# Patient Record
Sex: Female | Born: 1960 | Race: Black or African American | Hispanic: No | Marital: Single | Smoking: Never smoker
Health system: Southern US, Community
[De-identification: ages and names within clinical notes are randomized; demographics above are authoritative.]

## PROBLEM LIST (undated history)

## (undated) DIAGNOSIS — L0100 Impetigo, unspecified: Secondary | ICD-10-CM

## (undated) DIAGNOSIS — D472 Monoclonal gammopathy: Secondary | ICD-10-CM

## (undated) DIAGNOSIS — D649 Anemia, unspecified: Secondary | ICD-10-CM

## (undated) DIAGNOSIS — D329 Benign neoplasm of meninges, unspecified: Secondary | ICD-10-CM

## (undated) DIAGNOSIS — Z86718 Personal history of other venous thrombosis and embolism: Secondary | ICD-10-CM

## (undated) DIAGNOSIS — Z862 Personal history of diseases of the blood and blood-forming organs and certain disorders involving the immune mechanism: Secondary | ICD-10-CM

## (undated) DIAGNOSIS — F70 Mild intellectual disabilities: Secondary | ICD-10-CM

## (undated) DIAGNOSIS — R160 Hepatomegaly, not elsewhere classified: Secondary | ICD-10-CM

## (undated) DIAGNOSIS — Z86711 Personal history of pulmonary embolism: Secondary | ICD-10-CM

## (undated) DIAGNOSIS — L0390 Cellulitis, unspecified: Secondary | ICD-10-CM

## (undated) DIAGNOSIS — R569 Unspecified convulsions: Secondary | ICD-10-CM

## (undated) DIAGNOSIS — E079 Disorder of thyroid, unspecified: Secondary | ICD-10-CM

## (undated) HISTORY — DX: Hepatomegaly, not elsewhere classified: R16.0

## (undated) HISTORY — DX: Monoclonal gammopathy: D47.2

## (undated) HISTORY — DX: Personal history of other venous thrombosis and embolism: Z86.718

## (undated) HISTORY — DX: Personal history of pulmonary embolism: Z86.711

## (undated) HISTORY — PX: NO PAST SURGERIES: SHX2092

## (undated) HISTORY — DX: Impetigo, unspecified: L01.00

## (undated) HISTORY — DX: Disorder of thyroid, unspecified: E07.9

## (undated) HISTORY — DX: Benign neoplasm of meninges, unspecified: D32.9

## (undated) HISTORY — DX: Cellulitis, unspecified: L03.90

## (undated) HISTORY — DX: Unspecified convulsions: R56.9

## (undated) HISTORY — DX: Anemia, unspecified: D64.9

## (undated) HISTORY — DX: Personal history of diseases of the blood and blood-forming organs and certain disorders involving the immune mechanism: Z86.2

## (undated) HISTORY — DX: Mild intellectual disabilities: F70

---

## 2003-10-29 ENCOUNTER — Ambulatory Visit: Payer: Self-pay | Admitting: Internal Medicine

## 2003-11-12 ENCOUNTER — Ambulatory Visit: Payer: Self-pay | Admitting: Internal Medicine

## 2005-01-17 ENCOUNTER — Ambulatory Visit: Payer: Self-pay | Admitting: Internal Medicine

## 2005-01-25 ENCOUNTER — Ambulatory Visit: Payer: Self-pay | Admitting: Internal Medicine

## 2005-02-15 ENCOUNTER — Ambulatory Visit: Payer: Self-pay | Admitting: Internal Medicine

## 2006-09-07 ENCOUNTER — Emergency Department: Payer: Self-pay | Admitting: Emergency Medicine

## 2008-11-02 ENCOUNTER — Ambulatory Visit: Payer: Self-pay | Admitting: Internal Medicine

## 2008-11-03 ENCOUNTER — Inpatient Hospital Stay: Payer: Self-pay | Admitting: Internal Medicine

## 2008-11-17 ENCOUNTER — Inpatient Hospital Stay: Payer: Self-pay | Admitting: Internal Medicine

## 2008-11-29 ENCOUNTER — Inpatient Hospital Stay: Payer: Self-pay | Admitting: Internal Medicine

## 2009-07-17 ENCOUNTER — Ambulatory Visit: Payer: Self-pay | Admitting: Geriatric Medicine

## 2011-01-24 ENCOUNTER — Ambulatory Visit: Payer: Self-pay | Admitting: Internal Medicine

## 2011-11-23 ENCOUNTER — Emergency Department: Payer: Self-pay | Admitting: Emergency Medicine

## 2011-11-23 LAB — URINALYSIS, COMPLETE
Blood: NEGATIVE
Ketone: NEGATIVE
Ph: 6 (ref 4.5–8.0)
Protein: NEGATIVE
Specific Gravity: 1.008 (ref 1.003–1.030)

## 2011-11-23 LAB — HEPATIC FUNCTION PANEL A (ARMC)
Albumin: 3.5 g/dL (ref 3.4–5.0)
Alkaline Phosphatase: 147 U/L — ABNORMAL HIGH (ref 50–136)
Bilirubin, Direct: <0.1 mg/dL (ref 0.00–0.20)
SGOT(AST): 25 U/L (ref 15–37)
SGPT (ALT): 20 U/L (ref 12–78)

## 2011-11-23 LAB — PHENYTOIN LEVEL, TOTAL: Dilantin: 14.2 ug/mL (ref 10.0–20.0)

## 2011-11-23 LAB — BASIC METABOLIC PANEL
Anion Gap: 3 — ABNORMAL LOW (ref 7–16)
BUN: 12 mg/dL (ref 7–18)
Calcium, Total: 8.9 mg/dL (ref 8.5–10.1)
Chloride: 107 mmol/L (ref 98–107)
EGFR (Non-African Amer.): >60
Osmolality: 276 (ref 275–301)
Potassium: 4.3 mmol/L (ref 3.5–5.1)

## 2011-11-23 LAB — CBC
HGB: 11 g/dL — ABNORMAL LOW (ref 12.0–16.0)
MCV: 69 fL — ABNORMAL LOW (ref 80–100)
Platelet: 291 10*3/uL (ref 150–440)
RBC: 5 10*6/uL (ref 3.80–5.20)
WBC: 27.9 10*3/uL — ABNORMAL HIGH (ref 3.6–11.0)

## 2011-11-23 LAB — DIFFERENTIAL
Comment - H1-Com1: NORMAL
Eosinophil: 1 %
Segmented Neutrophils: 24 %
Variant Lymphocyte - H1-Rlymph: 7 %

## 2011-11-23 LAB — PROTIME-INR: INR: 1.9

## 2011-11-23 LAB — WET PREP, GENITAL

## 2011-11-23 LAB — APTT: Activated PTT: 24.1 secs (ref 23.6–35.9)

## 2011-11-25 LAB — URINE CULTURE

## 2011-11-29 LAB — CULTURE, BLOOD (SINGLE)

## 2012-02-29 ENCOUNTER — Ambulatory Visit: Payer: Self-pay | Admitting: Internal Medicine

## 2012-03-27 ENCOUNTER — Ambulatory Visit: Payer: Self-pay | Admitting: Internal Medicine

## 2012-03-28 LAB — CBC CANCER CENTER
Basophil %: 0.4 %
Eosinophil %: 0.9 %
HCT: 36.1 % (ref 35.0–47.0)
HGB: 11.1 g/dL — ABNORMAL LOW (ref 12.0–16.0)
Lymphocyte %: 53 %
MCV: 68 fL — ABNORMAL LOW (ref 80–100)
Monocyte #: 0.9 x10 3/mm (ref 0.2–0.9)
Monocyte %: 4.4 %
Neutrophil #: 8.9 x10 3/mm — ABNORMAL HIGH (ref 1.4–6.5)
Neutrophil %: 41.3 %
Platelet: 240 x10 3/mm (ref 150–440)
RBC: 5.28 10*6/uL — ABNORMAL HIGH (ref 3.80–5.20)
WBC: 21.5 x10 3/mm — ABNORMAL HIGH (ref 3.6–11.0)

## 2012-03-28 LAB — CREATININE, SERUM
EGFR (African American): >60
EGFR (Non-African Amer.): >60

## 2012-03-29 LAB — PROT IMMUNOELECTROPHORES(ARMC)

## 2012-04-02 ENCOUNTER — Ambulatory Visit: Payer: Self-pay | Admitting: Internal Medicine

## 2012-05-02 ENCOUNTER — Ambulatory Visit: Payer: Self-pay | Admitting: Internal Medicine

## 2012-06-02 ENCOUNTER — Ambulatory Visit: Payer: Self-pay | Admitting: Internal Medicine

## 2012-06-05 ENCOUNTER — Ambulatory Visit: Payer: Self-pay | Admitting: Internal Medicine

## 2012-06-05 LAB — CBC CANCER CENTER
Eosinophil #: 0.2 x10 3/mm (ref 0.0–0.7)
HGB: 12.3 g/dL (ref 12.0–16.0)
Lymphocyte %: 44.7 %
MCH: 21.6 pg — ABNORMAL LOW (ref 26.0–34.0)
MCV: 68 fL — ABNORMAL LOW (ref 80–100)
Monocyte #: 1 x10 3/mm — ABNORMAL HIGH (ref 0.2–0.9)
Neutrophil #: 9.6 x10 3/mm — ABNORMAL HIGH (ref 1.4–6.5)
Neutrophil %: 48.7 %
Platelet: 306 x10 3/mm (ref 150–440)
RBC: 5.69 10*6/uL — ABNORMAL HIGH (ref 3.80–5.20)
RDW: 15.8 % — ABNORMAL HIGH (ref 11.5–14.5)
WBC: 19.6 x10 3/mm — ABNORMAL HIGH (ref 3.6–11.0)

## 2012-06-24 ENCOUNTER — Ambulatory Visit: Payer: Self-pay | Admitting: Surgery

## 2012-06-24 LAB — PROTIME-INR
INR: 1.2
Prothrombin Time: 15.1 secs — ABNORMAL HIGH (ref 11.5–14.7)

## 2012-07-02 ENCOUNTER — Ambulatory Visit: Payer: Self-pay | Admitting: Internal Medicine

## 2012-07-17 LAB — CBC CANCER CENTER
Basophil #: 0.2 x10 3/mm — ABNORMAL HIGH (ref 0.0–0.1)
Basophil %: 1 %
Eosinophil %: 0.8 %
HCT: 33.5 % — ABNORMAL LOW (ref 35.0–47.0)
HGB: 10.6 g/dL — ABNORMAL LOW (ref 12.0–16.0)
Lymphocyte #: 8.9 x10 3/mm — ABNORMAL HIGH (ref 1.0–3.6)
Lymphocyte %: 49.5 %
MCH: 21.2 pg — ABNORMAL LOW (ref 26.0–34.0)
MCHC: 31.5 g/dL — ABNORMAL LOW (ref 32.0–36.0)
Monocyte #: 0.9 x10 3/mm (ref 0.2–0.9)
Neutrophil #: 7.9 x10 3/mm — ABNORMAL HIGH (ref 1.4–6.5)
Neutrophil %: 43.7 %
Platelet: 284 x10 3/mm (ref 150–440)
RBC: 4.98 10*6/uL (ref 3.80–5.20)
RDW: 15.5 % — ABNORMAL HIGH (ref 11.5–14.5)
WBC: 18 x10 3/mm — ABNORMAL HIGH (ref 3.6–11.0)

## 2012-07-17 LAB — CREATININE, SERUM
Creatinine: 0.8 mg/dL (ref 0.60–1.30)
EGFR (African American): >60
EGFR (Non-African Amer.): >60

## 2012-07-18 LAB — CEA: CEA: 1.4 ng/mL (ref 0.0–4.7)

## 2012-07-18 LAB — PROT IMMUNOELECTROPHORES(ARMC)

## 2012-08-02 ENCOUNTER — Ambulatory Visit: Payer: Self-pay | Admitting: Internal Medicine

## 2012-08-22 LAB — CBC CANCER CENTER
Basophil %: 0.6 %
Eosinophil #: 0.1 x10 3/mm (ref 0.0–0.7)
Lymphocyte #: 10.5 x10 3/mm — ABNORMAL HIGH (ref 1.0–3.6)
Lymphocyte %: 46.3 %
MCH: 21.1 pg — ABNORMAL LOW (ref 26.0–34.0)
MCHC: 31 g/dL — ABNORMAL LOW (ref 32.0–36.0)
MCV: 68 fL — ABNORMAL LOW (ref 80–100)
Monocyte %: 4.2 %
Neutrophil #: 10.9 x10 3/mm — ABNORMAL HIGH (ref 1.4–6.5)
Neutrophil %: 48.3 %
RBC: 5.49 10*6/uL — ABNORMAL HIGH (ref 3.80–5.20)
WBC: 22.7 x10 3/mm — ABNORMAL HIGH (ref 3.6–11.0)

## 2012-08-22 LAB — IRON AND TIBC
Iron Bind.Cap.(Total): 221 ug/dL — ABNORMAL LOW (ref 250–450)
Iron: 94 ug/dL (ref 50–170)
Unbound Iron-Bind.Cap.: 127 ug/dL

## 2012-08-22 LAB — FERRITIN: Ferritin (ARMC): 65 ng/mL (ref 8–388)

## 2012-08-23 LAB — PROT IMMUNOELECTROPHORES(ARMC)

## 2012-09-02 ENCOUNTER — Ambulatory Visit: Payer: Self-pay | Admitting: Internal Medicine

## 2013-07-03 ENCOUNTER — Ambulatory Visit: Payer: Self-pay | Admitting: Family Medicine

## 2014-06-19 ENCOUNTER — Other Ambulatory Visit: Payer: Self-pay | Admitting: Family Medicine

## 2014-06-19 DIAGNOSIS — Z1239 Encounter for other screening for malignant neoplasm of breast: Secondary | ICD-10-CM

## 2014-07-07 ENCOUNTER — Ambulatory Visit: Payer: Medicare Other

## 2014-07-07 ENCOUNTER — Ambulatory Visit
Admission: RE | Admit: 2014-07-07 | Discharge: 2014-07-07 | Disposition: A | Discharge status: Home / Self Care | Payer: Medicare Other | Source: Ambulatory Visit | Attending: Family Medicine | Admitting: Family Medicine

## 2014-07-07 ENCOUNTER — Other Ambulatory Visit: Payer: Self-pay | Admitting: Family Medicine

## 2014-07-07 ENCOUNTER — Inpatient Hospital Stay: Payer: Medicare Other | Attending: Hematology and Oncology | Admitting: Hematology and Oncology

## 2014-07-07 ENCOUNTER — Encounter: Payer: Self-pay | Admitting: Hematology and Oncology

## 2014-07-07 VITALS — BP 145/78 | HR 76 | Temp 95.5°F | Resp 18 | Wt 198.2 lb

## 2014-07-07 DIAGNOSIS — Z79899 Other long term (current) drug therapy: Secondary | ICD-10-CM

## 2014-07-07 DIAGNOSIS — Z1239 Encounter for other screening for malignant neoplasm of breast: Secondary | ICD-10-CM

## 2014-07-07 DIAGNOSIS — D329 Benign neoplasm of meninges, unspecified: Secondary | ICD-10-CM | POA: Diagnosis not present

## 2014-07-07 DIAGNOSIS — Z86718 Personal history of other venous thrombosis and embolism: Secondary | ICD-10-CM

## 2014-07-07 DIAGNOSIS — N390 Urinary tract infection, site not specified: Secondary | ICD-10-CM | POA: Diagnosis present

## 2014-07-07 DIAGNOSIS — M6282 Rhabdomyolysis: Secondary | ICD-10-CM | POA: Diagnosis present

## 2014-07-07 DIAGNOSIS — I82431 Acute embolism and thrombosis of right popliteal vein: Principal | ICD-10-CM | POA: Diagnosis present

## 2014-07-07 DIAGNOSIS — C8510 Unspecified B-cell lymphoma, unspecified site: Secondary | ICD-10-CM | POA: Insufficient documentation

## 2014-07-07 DIAGNOSIS — Z86711 Personal history of pulmonary embolism: Secondary | ICD-10-CM | POA: Insufficient documentation

## 2014-07-07 DIAGNOSIS — D472 Monoclonal gammopathy: Secondary | ICD-10-CM | POA: Diagnosis not present

## 2014-07-07 DIAGNOSIS — Z1231 Encounter for screening mammogram for malignant neoplasm of breast: Secondary | ICD-10-CM

## 2014-07-07 DIAGNOSIS — Z7901 Long term (current) use of anticoagulants: Secondary | ICD-10-CM | POA: Insufficient documentation

## 2014-07-07 DIAGNOSIS — Z0000 Encounter for general adult medical examination without abnormal findings: Secondary | ICD-10-CM

## 2014-07-07 DIAGNOSIS — G40909 Epilepsy, unspecified, not intractable, without status epilepticus: Secondary | ICD-10-CM | POA: Diagnosis present

## 2014-07-07 DIAGNOSIS — Z8249 Family history of ischemic heart disease and other diseases of the circulatory system: Secondary | ICD-10-CM

## 2014-07-07 DIAGNOSIS — D649 Anemia, unspecified: Secondary | ICD-10-CM | POA: Diagnosis present

## 2014-07-07 DIAGNOSIS — F70 Mild intellectual disabilities: Secondary | ICD-10-CM | POA: Diagnosis present

## 2014-07-07 DIAGNOSIS — L03115 Cellulitis of right lower limb: Secondary | ICD-10-CM | POA: Diagnosis present

## 2014-07-07 DIAGNOSIS — I824Z1 Acute embolism and thrombosis of unspecified deep veins of right distal lower extremity: Secondary | ICD-10-CM | POA: Diagnosis present

## 2014-07-07 DIAGNOSIS — D509 Iron deficiency anemia, unspecified: Secondary | ICD-10-CM | POA: Diagnosis not present

## 2014-07-07 LAB — CBC WITH DIFFERENTIAL/PLATELET
Basophils Absolute: 0.1 10*3/uL (ref 0–0.1)
Basophils Relative: 0 %
Eosinophils Absolute: 0.2 10*3/uL (ref 0–0.7)
Eosinophils Relative: 1 %
HCT: 33.4 % — ABNORMAL LOW (ref 35.0–47.0)
Hemoglobin: 10 g/dL — ABNORMAL LOW (ref 12.0–16.0)
Lymphocytes Relative: 64 %
Lymphs Abs: 14.6 10*3/uL — ABNORMAL HIGH (ref 1.0–3.6)
MCH: 20.7 pg — ABNORMAL LOW (ref 26.0–34.0)
MCHC: 29.8 g/dL — ABNORMAL LOW (ref 32.0–36.0)
MCV: 69.5 fL — ABNORMAL LOW (ref 80.0–100.0)
Monocytes Absolute: 0.9 10*3/uL (ref 0.2–0.9)
Monocytes Relative: 4 %
Neutro Abs: 7 10*3/uL — ABNORMAL HIGH (ref 1.4–6.5)
Neutrophils Relative %: 31 %
Platelets: 275 10*3/uL (ref 150–440)
RBC: 4.81 MIL/uL (ref 3.80–5.20)
RDW: 16.4 % — ABNORMAL HIGH (ref 11.5–14.5)
WBC: 22.8 10*3/uL — ABNORMAL HIGH (ref 3.6–11.0)

## 2014-07-07 LAB — COMPREHENSIVE METABOLIC PANEL
ALT: 13 U/L — ABNORMAL LOW (ref 14–54)
AST: 17 U/L (ref 15–41)
Albumin: 3.5 g/dL (ref 3.5–5.0)
Alkaline Phosphatase: 121 U/L (ref 38–126)
Anion gap: 6 (ref 5–15)
BUN: 11 mg/dL (ref 6–20)
CO2: 27 mmol/L (ref 22–32)
Calcium: 8.3 mg/dL — ABNORMAL LOW (ref 8.9–10.3)
Chloride: 102 mmol/L (ref 101–111)
Creatinine, Ser: 0.69 mg/dL (ref 0.44–1.00)
GFR calc Af Amer: >60 mL/min (ref >60)
GFR calc non Af Amer: >60 mL/min (ref >60)
Glucose, Bld: 83 mg/dL (ref 65–99)
Potassium: 3.9 mmol/L (ref 3.5–5.1)
Sodium: 135 mmol/L (ref 135–145)
Total Bilirubin: 0.2 mg/dL — ABNORMAL LOW (ref 0.3–1.2)
Total Protein: 8.3 g/dL — ABNORMAL HIGH (ref 6.5–8.1)

## 2014-07-07 LAB — FERRITIN: Ferritin: 34 ng/mL (ref 11–307)

## 2014-07-07 LAB — SEDIMENTATION RATE: Sed Rate: 20 mm/hr (ref 0–30)

## 2014-07-07 LAB — RETICULOCYTES
RBC.: 4.81 MIL/uL (ref 3.80–5.20)
Retic Count, Absolute: 57.7 10*3/uL (ref 19.0–183.0)
Retic Ct Pct: 1.2 % (ref 0.4–3.1)

## 2014-07-07 LAB — IRON AND TIBC
Iron: 68 ug/dL (ref 28–170)
Saturation Ratios: 32 % — ABNORMAL HIGH (ref 10.4–31.8)
TIBC: 212 ug/dL — ABNORMAL LOW (ref 250–450)
UIBC: 144 ug/dL

## 2014-07-07 LAB — URIC ACID: Uric Acid, Serum: 5.3 mg/dL (ref 2.3–6.6)

## 2014-07-07 LAB — LACTATE DEHYDROGENASE: LDH: 133 U/L (ref 98–192)

## 2014-07-07 NOTE — Progress Notes (Signed)
[facility] day:  07/07/2014  Chief Complaint: Laurie Rice is a 54 y.o. female with a low grade B cell lymphoma,  microcytic anemia, and meningioma who is seen for reassessment.  HPI: The patient has a history of lymphocytosis and neutrophilia dating back to at least 09/2006. Flow cytometry on 11/2008 revealed a low-grade B-cell lymphoma (CD20 positive, CD5 negative). BCR-ABL testing was negative.  In association, she has had an IgM gammopathy. Notes indicate a level of 300 mg/dL in 11/2008 and then later 1.3 g/dL.  She has had chronic microcytic anemia also noted in 2008. Hemoglobin has ranged between 10 and 11. Notes from Dr. Inez Pilgrim reveal negative stool guaiacs. B12 was normal. Iron saturation was 19%. Hemoglobin electrophoresis revealed a normal hemoglobin F and A2 thus ruling out beta thalassemia. Colonoscopy in 05/2011 was normal.  The patient has had imaging studies suggesting a possible lung mass. PET scan in 11/2008 was negative. Abdominal CT scan in 2013 revealed atelectasis of the lung bases. There was also question of a possible liver mass on contrast CT in 11/2011. MRI in 04/2012 was negative.  Patient has had abnormal thyroid noted on CT scan with abnormal PET uptake in 2010. She was seen by Dr. Jamal Collin. Ultrasound guided biopsy on 06/24/2012 was negative.  She has a seizure disorder and with notation of  a meningioma. Meningioma was 5 mm on 11/2008. She has not had follow-up MRIs.  She has a history of bilateral DVT and pulmonary embolism in 2010. She is on Coumadin maintenance which is monitored by her primary care physician.   Past Medical History  Diagnosis Date  . Seizures   . Mild mental retardation   . Meningioma   . 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 Personal history of pulmonary embolism   . History of lymphocytosis   . Monoclonal gammopathy     low level  . Anemia     chronic, mcv  chronically low  . Cellulitis     History of  . Impetigo     History of  . Liver masses     History of, Benign  . Thyroid mass     history of    Past Surgical History  Procedure Laterality Date  . No past surgeries      Family History  Problem Relation Age of Onset  . Hypertension    . Arthritis      Social History:  reports that she has never smoked. She does not have any smokeless tobacco history on file. She reports that she does not drink alcohol or use illicit drugs.  The patient is accompanied by Maudie Mercury from family care at [facility] family [facility].  She spends her days watching TV.  She can dress on her own.  She needs help with showers.  Allergies: No Known Allergies  Current Medications: No current facility-administered medications for this visit.   Current Outpatient Prescriptions  Medication Sig Dispense Refill  . amoxicillin-clavulanate (AUGMENTIN) 875-125 MG per tablet Take 1 tablet by mouth 2 (two) times daily. 10 tablet 0  . enoxaparin (LOVENOX) 100 MG/ML injection Inject 0.9 mLs (90 mg total) into the skin every 12 (twelve) hours. 10 mL 0  . levofloxacin (LEVAQUIN) 500 MG tablet Take 1 tablet (500 mg total) by mouth daily. 3 tablet 0   Facility-Administered Medications Ordered in Other Visits  Medication Dose Route Frequency Provider Last Rate Last Dose  .  acetaminophen (TYLENOL) tablet 650 mg  650 mg Oral Q6H PRN Lance Coon, MD       Or  . acetaminophen (TYLENOL) suppository 650 mg  650 mg Rectal Q6H PRN Lance Coon, MD      . Ampicillin-Sulbactam (UNASYN) 3 g in sodium chloride 0.9 % 100 mL IVPB  3 g Intravenous Q6H Dustin Flock, MD   3 g at 07/13/14 1206  . enoxaparin (LOVENOX) injection 90 mg  1 mg/kg Subcutaneous BID Vaughan Basta, MD   90 mg at 07/13/14 1207  . HYDROcodone-acetaminophen (NORCO/VICODIN) 5-325 MG per tablet 1-2 tablet  1-2 tablet Oral Q4H PRN Lance Coon, MD      . levofloxacin [facility]) tablet 500 mg  500 mg Oral Daily  Vaughan Basta, MD   500 mg at 07/13/14 0854  . PHENobarbital (LUMINAL) tablet 64.8 mg  64.8 mg Oral QHS Vaughan Basta, MD      . phenytoin (DILANTIN) chewable tablet 50 mg  50 mg Oral QHS Vaughan Basta, MD   50 mg at 07/12/14 2143  . phenytoin (DILANTIN) ER capsule 200 mg  200 mg Oral QHS Lance Coon, MD   200 mg at 07/12/14 2143  . warfarin (COUMADIN) tablet 7.5 mg  7.5 mg Oral q1800 Vaughan Basta, MD   7.5 mg at 07/12/14 1818  . Warfarin - Pharmacist Dosing Inpatient   Does not apply [id number] Vaughan Basta, MD        Review of Systems:  GENERAL:  Feels fine.  Active.  No fevers or sweats.  Weight loss minimal. PERFORMANCE STATUS (ECOG):  3 HEENT:  No visual changes, runny nose, sore throat, mouth sores or tenderness. Lungs: No shortness of breath or cough.  No hemoptysis. Cardiac:  No chest pain, palpitations, orthopnea, or PND. GI:  No nausea, vomiting, diarrhea, constipation, melena or hematochezia. GU:  No urgency, frequency, dysuria, or hematuria. Musculoskeletal:  No back pain.  No joint pain.  No muscle tenderness. Extremities:  No pain or swelling. Skin:  No rashes or skin changes. Neuro:  No seizure in a long time.  No headache, numbness or weakness, balance or coordination issues. Endocrine:  No diabetes, thyroid issues, hot flashes or night sweats. Psych:  No mood changes, depression or anxiety. Pain:  No focal pain. Review of systems:  All other systems reviewed and found to be negative.   Physical Exam: Blood pressure 145/78, pulse 76, temperature 95.5 F (35.3 C), temperature source Tympanic, resp. rate 18, weight 198 lb 3.1 oz (89.9 kg). GENERAL:  Well developed, well nourished, sitting comfortably in the exam room in no acute distress.  Rolling walker at her side.   MENTAL STATUS:  Alert and oriented to person, place and time. HEAD:  Flat facial expression.  Normocephalic, atraumatic, face symmetric, no Cushingoid features. EYES:   Pupils equal round and reactive to light and accomodation.  No conjunctivitis or scleral icterus. ENT:  Oropharynx clear without lesion.  Tongue normal. Mucous membranes moist.  RESPIRATORY:  Clear to auscultation without rales, wheezes or rhonchi. CARDIOVASCULAR:  Regular rate and rhythm without murmur, rub or gallop. ABDOMEN:  Soft, non-tender, with active bowel sounds, and no hepatosplenomegaly.  No masses. SKIN:  Bruising on right leg 1 week ago without trauma.  No rashes, ulcers or lesions. EXTREMITIES: No edema, no skin discoloration or tenderness.  No palpable cords. LYMPH NODES: No palpable cervical, supraclavicular, axillary or inguinal adenopathy  PSYCH:  Appropriate.  Office Visit on 07/07/2014  Component Date Value Ref Range Status  .  Uric Acid, Serum 07/07/2014 5.3  2.3 - 6.6 mg/dL Final  . WBC 07/07/2014 22.8* 3.6 - 11.0 K/uL Final  . RBC 07/07/2014 4.81  3.80 - 5.20 MIL/uL Final  . Hemoglobin 07/07/2014 10.0* 12.0 - 16.0 g/dL Final  . HCT 07/07/2014 33.4* 35.0 - 47.0 % Final  . MCV 07/07/2014 69.5* 80.0 - 100.0 fL Final  . MCH 07/07/2014 20.7* 26.0 - 34.0 pg Final  . MCHC 07/07/2014 29.8* 32.0 - 36.0 g/dL Final  . RDW 07/07/2014 16.4* 11.5 - 14.5 % Final  . Platelets 07/07/2014 275  150 - 440 K/uL Final  . Neutrophils Relative % 07/07/2014 31%   Final  . Neutro Abs 07/07/2014 7.0* 1.4 - 6.5 K/uL Final  . Lymphocytes Relative 07/07/2014 64%   Final  . Lymphs Abs 07/07/2014 14.6* 1.0 - 3.6 K/uL Final  . Monocytes Relative 07/07/2014 4%   Final  . Monocytes Absolute 07/07/2014 0.9  0.2 - 0.9 K/uL Final  . Eosinophils Relative 07/07/2014 1%   Final  . Eosinophils Absolute 07/07/2014 0.2  0 - 0.7 K/uL Final  . Basophils Relative 07/07/2014 0%   Final  . Basophils Absolute 07/07/2014 0.1  0 - 0.1 K/uL Final  . Ferritin 07/07/2014 34  11 - 307 ng/mL Final  . Sed Rate 07/07/2014 20  0 - 30 mm/hr Final  . Iron 07/07/2014 68  28 - 170 ug/dL Final  . TIBC 07/07/2014 212* 250 -  450 ug/dL Final  . Saturation Ratios 07/07/2014 32* 10.4 - 31.8 % Final  . UIBC 07/07/2014 144   Final  . Sodium 07/07/2014 135  135 - 145 mmol/L Final  . Potassium 07/07/2014 3.9  3.5 - 5.1 mmol/L Final  . Chloride 07/07/2014 102  101 - 111 mmol/L Final  . CO2 07/07/2014 27  22 - 32 mmol/L Final  . Glucose, Bld 07/07/2014 83  65 - 99 mg/dL Final  . BUN 07/07/2014 11  6 - 20 mg/dL Final  . Creatinine, Ser 07/07/2014 0.69  0.44 - 1.00 mg/dL Final  . Calcium 07/07/2014 8.3* 8.9 - 10.3 mg/dL Final  . Total Protein 07/07/2014 8.3* 6.5 - 8.1 g/dL Final  . Albumin 07/07/2014 3.5  3.5 - 5.0 g/dL Final  . AST 07/07/2014 17  15 - 41 U/L Final  . ALT 07/07/2014 13* 14 - 54 U/L Final  . Alkaline Phosphatase 07/07/2014 121  38 - 126 U/L Final  . Total Bilirubin 07/07/2014 0.2* 0.3 - 1.2 mg/dL Final  . GFR calc non Af Amer 07/07/2014 >60  >60 mL/min Final  . GFR calc Af Amer 07/07/2014 >60  >60 mL/min Final   Comment: (NOTE) The eGFR has been calculated using the CKD EPI equation. This calculation has not been validated in all clinical situations. eGFR's persistently <60 mL/min signify possible Chronic Kidney Disease.   . Anion gap 07/07/2014 6  5 - 15 Final  . Total Protein ELP 07/07/2014 7.9  6.0 - 8.5 g/dL Final  . Albumin ELP 07/07/2014 3.5  2.9 - 4.4 g/dL Final  . Alpha-1-Globulin 07/07/2014 0.3  0.0 - 0.4 g/dL Final  . Alpha-2-Globulin 07/07/2014 0.8  0.4 - 1.0 g/dL Final  . Beta Globulin 07/07/2014 2.5* 0.7 - 1.3 g/dL Final  . Gamma Globulin 07/07/2014 0.8  0.4 - 1.8 g/dL Final  . M-Spike, % 07/07/2014 1.6* Not Observed g/dL Final  . SPE Interp. 07/07/2014 Comment   Final   Comment: (NOTE) The SPE pattern demonstrates a single peak (M-spike) in the beta  region which may represent monoclonal protein. This peak may also be caused by the presence of fibrinogen or circulating immune complexes. If clinically indicated, the presence of a monoclonal gammopathy may be confirmed by  immunofixation, as well as evaluation of the urine for the presence of Bence-Jones protein. Performed At: [facility] [place], [place] [id number] Lindon Romp MD BS:[id number]   . Comment 07/07/2014 Comment   Final   Comment: (NOTE) Protein electrophoresis scan will follow via computer, mail, or courier delivery.   Marland Kitchen GLOBULIN, TOTAL 07/07/2014 4.4* 2.2 - 3.9 g/dL Corrected  . A/G Ratio 07/07/2014 0.8  0.7 - 1.7 Corrected  . IgG (Immunoglobin G), Serum 07/07/2014 1129  700 - 1600 mg/dL Final  . IgA 07/07/2014 161  87 - 352 mg/dL Final  . IgM, Serum 07/07/2014 2420* 26 - 217 mg/dL Final   Comment: (NOTE) Results confirmed on dilution. Performed At: [facility] [place], [place] [id number] Lindon Romp MD SV:[id number]   . LDH 07/07/2014 133  98 - 192 U/L Final  . Retic Ct Pct 07/07/2014 1.2  0.4 - 3.1 % Final  . RBC. 07/07/2014 4.81  3.80 - 5.20 MIL/uL Final  . Retic Count, Manual 07/07/2014 57.7  19.0 - 183.0 K/uL Final    Assessment:  Laurie Rice is a 54 y.o. female with a history of lymphocytosis and neutrophilia dating back to at least 09/2006. Flow cytometry on 11/2008 revealed a low-grade B-cell lymphoma (CD20 positive, CD5 negative). BCR-ABL testing was negative.  In association, she has had an IgM gammopathy. Monoclonal protein was 300 mg/dL in 11/2008 and then later 1.3 g/dL.  She has had chronic microcytic anemia since 2008. Hemoglobin has ranged between 10 and 11.  She has had negative stool guaiacs. B12 was normal. Iron saturation was 19%. Hemoglobin electrophoresis revealed a normal hemoglobin F and A2 thus ruling out beta thalassemia. Colonoscopy in 05/2011 was normal.  She has a history of an abnormal thyroid noted on CT scan with abnormal PET uptake in 2010. Ultrasound guided biopsy on 06/24/2012 was negative.  She has a seizure disorder and a meningioma. Meningioma was 5 mm on 11/2008. She has not had  follow-up.  She has a history of bilateral DVT and pulmonary embolism in 2010. She is on Coumadin maintenance (managed by her PCP).   Plan: 1. Review entire medical history including diagnosis of low grade lymphoma, monoclonal gammopathy, microcytic anemia, and meningioma. 2. Labs today:  CBC with diff, CMP, LDH, uric acid, SPEP, immunoglobulins (IgG, IgA, IgM), ferritin, iron studies, ESR 3. Schedule head MRI with and without contrast- f/u meningioma; h/o seizure disorder 4. Followup mammogram scheduled for today. 5. RTC in 2-3 weeks for review of studies and discussion regarding direction of therapy.   Lequita Asal, MD

## 2014-07-07 NOTE — Progress Notes (Signed)
Pt with no personal or family history of cancer.Marland Kitchen.(Information obtained from genetic screening form on 07/16/2012).Marland KitchenMarland Kitchen

## 2014-07-08 LAB — PROTEIN ELECTROPHORESIS, SERUM
A/G Ratio: 0.8 (ref 0.7–1.7)
Albumin ELP: 3.5 g/dL (ref 2.9–4.4)
Alpha-1-Globulin: 0.3 g/dL (ref 0.0–0.4)
Alpha-2-Globulin: 0.8 g/dL (ref 0.4–1.0)
Beta Globulin: 2.5 g/dL — ABNORMAL HIGH (ref 0.7–1.3)
Gamma Globulin: 0.8 g/dL (ref 0.4–1.8)
Globulin, Total: 4.4 g/dL — ABNORMAL HIGH (ref 2.2–3.9)
M-Spike, %: 1.6 g/dL — ABNORMAL HIGH
Total Protein ELP: 7.9 g/dL (ref 6.0–8.5)

## 2014-07-08 LAB — IGG, IGA, IGM
IgA: 161 mg/dL (ref 87–352)
IgG (Immunoglobin G), Serum: 1129 mg/dL (ref 700–1600)
IgM, Serum: 2420 mg/dL — ABNORMAL HIGH (ref 26–217)

## 2014-07-09 ENCOUNTER — Encounter: Payer: Self-pay | Admitting: Emergency Medicine

## 2014-07-09 ENCOUNTER — Emergency Department: Payer: Medicare Other

## 2014-07-09 ENCOUNTER — Inpatient Hospital Stay
Admission: EM | Admit: 2014-07-09 | Discharge: 2014-07-13 | DRG: 300 | Disposition: A | Discharge status: Home Health | Payer: Medicare Other | Attending: Internal Medicine | Admitting: Internal Medicine

## 2014-07-09 DIAGNOSIS — L0390 Cellulitis, unspecified: Secondary | ICD-10-CM | POA: Diagnosis present

## 2014-07-09 DIAGNOSIS — M6282 Rhabdomyolysis: Secondary | ICD-10-CM

## 2014-07-09 DIAGNOSIS — Z5181 Encounter for therapeutic drug level monitoring: Secondary | ICD-10-CM

## 2014-07-09 DIAGNOSIS — L03115 Cellulitis of right lower limb: Secondary | ICD-10-CM | POA: Diagnosis present

## 2014-07-09 DIAGNOSIS — R569 Unspecified convulsions: Secondary | ICD-10-CM

## 2014-07-09 DIAGNOSIS — Z7901 Long term (current) use of anticoagulants: Secondary | ICD-10-CM

## 2014-07-09 DIAGNOSIS — I82431 Acute embolism and thrombosis of right popliteal vein: Secondary | ICD-10-CM | POA: Diagnosis not present

## 2014-07-09 DIAGNOSIS — I82409 Acute embolism and thrombosis of unspecified deep veins of unspecified lower extremity: Secondary | ICD-10-CM | POA: Diagnosis present

## 2014-07-09 DIAGNOSIS — F70 Mild intellectual disabilities: Secondary | ICD-10-CM | POA: Diagnosis present

## 2014-07-09 DIAGNOSIS — R791 Abnormal coagulation profile: Secondary | ICD-10-CM | POA: Diagnosis present

## 2014-07-09 DIAGNOSIS — I82401 Acute embolism and thrombosis of unspecified deep veins of right lower extremity: Secondary | ICD-10-CM

## 2014-07-09 LAB — COMPREHENSIVE METABOLIC PANEL
ALT: 22 U/L (ref 14–54)
ANION GAP: 8 (ref 5–15)
AST: 43 U/L — AB (ref 15–41)
Albumin: 3.2 g/dL — ABNORMAL LOW (ref 3.5–5.0)
Alkaline Phosphatase: 94 U/L (ref 38–126)
BUN: 13 mg/dL (ref 6–20)
CO2: 27 mmol/L (ref 22–32)
Calcium: 8.6 mg/dL — ABNORMAL LOW (ref 8.9–10.3)
Chloride: 105 mmol/L (ref 101–111)
Creatinine, Ser: 0.81 mg/dL (ref 0.44–1.00)
GFR calc non Af Amer: >60 mL/min (ref >60)
Glucose, Bld: 115 mg/dL — ABNORMAL HIGH (ref 65–99)
Potassium: 3.7 mmol/L (ref 3.5–5.1)
Sodium: 140 mmol/L (ref 135–145)
TOTAL PROTEIN: 7.8 g/dL (ref 6.5–8.1)
Total Bilirubin: 0.3 mg/dL (ref 0.3–1.2)

## 2014-07-09 LAB — URINALYSIS COMPLETE WITH MICROSCOPIC (ARMC ONLY)
BILIRUBIN URINE: NEGATIVE
Bacteria, UA: NONE SEEN
Glucose, UA: NEGATIVE mg/dL
Hgb urine dipstick: NEGATIVE
Ketones, ur: NEGATIVE mg/dL
Nitrite: NEGATIVE
PH: 5 (ref 5.0–8.0)
Protein, ur: 30 mg/dL — AB
Specific Gravity, Urine: 1.031 — ABNORMAL HIGH (ref 1.005–1.030)

## 2014-07-09 LAB — CBC WITH DIFFERENTIAL/PLATELET
BAND NEUTROPHILS: 0 % (ref 0–10)
Basophils Absolute: 0 10*3/uL (ref 0.0–0.1)
Basophils Relative: 0 % (ref 0–1)
Blasts: 0 %
Eosinophils Absolute: 0.6 10*3/uL (ref 0.0–0.7)
Eosinophils Relative: 2 % (ref 0–5)
HCT: 31 % — ABNORMAL LOW (ref 35.0–47.0)
Hemoglobin: 9.6 g/dL — ABNORMAL LOW (ref 12.0–16.0)
LYMPHS ABS: 13.8 10*3/uL — AB (ref 0.7–4.0)
LYMPHS PCT: 43 % (ref 12–46)
MCH: 21.5 pg — AB (ref 26.0–34.0)
MCHC: 31 g/dL — AB (ref 32.0–36.0)
MCV: 69.2 fL — ABNORMAL LOW (ref 80.0–100.0)
MONO ABS: 1.6 10*3/uL — AB (ref 0.1–1.0)
MYELOCYTES: 0 %
Metamyelocytes Relative: 0 %
Monocytes Relative: 5 % (ref 3–12)
Neutro Abs: 16.1 10*3/uL — ABNORMAL HIGH (ref 1.7–7.7)
Neutrophils Relative %: 50 % (ref 43–77)
Other: 0 %
PLATELETS: 190 10*3/uL (ref 150–440)
Promyelocytes Absolute: 0 %
RBC: 4.48 MIL/uL (ref 3.80–5.20)
RDW: 16.2 % — ABNORMAL HIGH (ref 11.5–14.5)
WBC: 32.1 10*3/uL — ABNORMAL HIGH (ref 3.6–11.0)
nRBC: 0 /100 WBC

## 2014-07-09 LAB — CK: Total CK: 1193 U/L — ABNORMAL HIGH (ref 38–234)

## 2014-07-09 LAB — PROTIME-INR
INR: 1.66
Prothrombin Time: 19.8 seconds — ABNORMAL HIGH (ref 11.4–15.0)

## 2014-07-09 MED ORDER — VANCOMYCIN HCL IN DEXTROSE 1-5 GM/200ML-% IV SOLN
1000.0000 mg | Freq: Once | INTRAVENOUS | Status: AC
  Administered 2014-07-09: 1000 mg via INTRAVENOUS
  Filled 2014-07-09: qty 200

## 2014-07-09 MED ORDER — ENOXAPARIN SODIUM 100 MG/ML ~~LOC~~ SOLN
90.0000 mg | Freq: Once | SUBCUTANEOUS | Status: AC
  Administered 2014-07-09: 90 mg via SUBCUTANEOUS
  Filled 2014-07-09: qty 1

## 2014-07-09 MED ORDER — ACETAMINOPHEN 500 MG PO TABS
1000.0000 mg | ORAL_TABLET | Freq: Once | ORAL | Status: AC
  Administered 2014-07-09: 1000 mg via ORAL
  Filled 2014-07-09: qty 2

## 2014-07-09 MED ORDER — SODIUM CHLORIDE 0.9 % IV BOLUS (SEPSIS)
1000.0000 mL | Freq: Once | INTRAVENOUS | Status: AC
  Administered 2014-07-09: 1000 mL via INTRAVENOUS

## 2014-07-09 NOTE — H&P (Signed)
[facility] [place] NAME: Laurie Rice    MR#:  [id number]  DATE OF BIRTH:  11-15-1960  DATE OF ADMISSION:  07/09/2014  PRIMARY CARE PHYSICIAN: Juluis Pitch, MD   REQUESTING/REFERRING PHYSICIAN: [place]  CHIEF COMPLAINT:   Chief Complaint  Patient presents with  . Rash    HISTORY OF PRESENT ILLNESS:  Laurie Rice  is a 54 y.o. female who presents with right lower extremity erythema and pain. She has a history of bilateral DVTs and is on Coumadin. She began developing right lower shunt erythema and pain couple of days ago, and was sent today by her care facility for evaluation. She denies any fever or chills, or infectious etiology. She states that the redness, swelling and pain have been slowly progressing. In the ED she was evaluated and found to have an elevated white count 30,000, and Doppler of the lower extremity showed a new DVT in the popliteal and smaller calf veins. Given her elevated white count, there was some concern for superimposed cellulitis. She was given vancomycin. Her INR was also found to be subtherapeutic despite her Coumadin dosing. She was given Lovenox, and hospitalists were called for admission.  PAST MEDICAL HISTORY:   Past Medical History  Diagnosis Date  . Seizures   . Mild mental retardation   . Meningioma   . 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 Personal history of pulmonary embolism   . History of lymphocytosis   . Monoclonal gammopathy     low level  . Anemia     chronic, mcv chronically low  . Cellulitis     History of  . Impetigo     History of  . Liver masses     History of, Benign  . Thyroid mass     history of    PAST SURGICAL HISTORY:   Past Surgical History  Procedure Laterality Date  . No past surgeries      SOCIAL HISTORY:   History  Substance Use Topics  . Smoking status: Never Smoker   . Smokeless tobacco: Not on file  .  Alcohol Use: No    FAMILY HISTORY:   Family History  Problem Relation Age of Onset  . Hypertension    . Arthritis      DRUG ALLERGIES:  No Known Allergies  MEDICATIONS AT HOME:   Prior to Admission medications   Medication Sig Start Date End Date Taking? Authorizing Provider  Emollient (CETAPHIL) cream APPLY SMALL AMOUNT TO AFFECTED AREAS ONCE DAILY 04/07/14   Historical Provider, MD  PHENobarbital (LUMINAL) 32.4 MG tablet Take by mouth.    Historical Provider, MD  phenytoin (DILANTIN) 100 MG ER capsule TAKE 2 CAPSULES BY MOUTH AT BEDTIME (SEIZURE DISORDER) 11/19/13   Historical Provider, MD  triamcinolone ointment (KENALOG) 0.1 % Apply topically.    Historical Provider, MD  warfarin (COUMADIN) 1 MG tablet TAKE 2 TABLETS (2MG ) BY MOUTH ONCE DAILY (ALONG W/ 5MG =7MG  TOTAL DOSAGE). 02/11/14   Historical Provider, MD  warfarin (COUMADIN) 5 MG tablet TAKE 1 TABLET BY MOUTH EVERY DAY (ALONG W/ 2MG =7MG  TOTAL DOSAGE). 02/11/14   Historical Provider, MD    REVIEW OF SYSTEMS:  Review of Systems  Constitutional: Negative for fever, chills, weight loss and malaise/fatigue.  HENT: Negative for ear pain, hearing loss and tinnitus.   Eyes: Negative for blurred vision, double vision, pain and redness.  Respiratory: Negative for cough, hemoptysis and  shortness of breath.   Cardiovascular: Positive for leg swelling (right lower extremity). Negative for chest pain, palpitations and orthopnea.  Gastrointestinal: Negative for nausea, vomiting, abdominal pain, diarrhea and constipation.  Genitourinary: Negative for dysuria, frequency and hematuria.  Musculoskeletal: Negative for back pain, joint pain and neck pain.       Right lower extremity pain  Skin:       RLE erythema and tenderness, No acne, rash, or lesions  Neurological: Negative for dizziness, tremors, focal weakness and weakness.  Endo/Heme/Allergies: Negative for polydipsia. Does not bruise/bleed easily.  Psychiatric/Behavioral: Negative  for depression. The patient is not nervous/anxious and does not have insomnia.      VITAL SIGNS:   Filed Vitals:   07/09/14 1810 07/09/14 2128  BP: 143/95 123/70  Pulse: 98 73  Temp: 99.7 F (37.6 C) 100 F (37.8 C)  TempSrc: Oral Oral  Resp: 16 16  Height: 5\' 6"  (1.676 m)   Weight: 89.812 kg (198 lb)   SpO2: 100% 100%   Wt Readings from Last 3 Encounters:  07/09/14 89.812 kg (198 lb)  07/07/14 89.9 kg (198 lb 3.1 oz)    PHYSICAL EXAMINATION:  Physical Exam  Constitutional: She is oriented to person, place, and time. She appears well-developed and well-nourished. No distress.  HENT:  Head: Normocephalic and atraumatic.  Mouth/Throat: Oropharynx is clear and moist.  Eyes: Conjunctivae and EOM are normal. Pupils are equal, round, and reactive to light. No scleral icterus.  Neck: Normal range of motion. Neck supple. No JVD present. No thyromegaly present.  Cardiovascular: Normal rate, regular rhythm and intact distal pulses.  Exam reveals no gallop and no friction rub.   No murmur heard. Respiratory: Effort normal and breath sounds normal. No respiratory distress. She has no wheezes. She has no rales.  GI: Soft. Bowel sounds are normal. She exhibits no distension. There is no tenderness.  Musculoskeletal: Normal range of motion. She exhibits edema (Right lower extremity).  Right lower shunt any tenderness, No arthritis, no gout  Lymphadenopathy:    She has no cervical adenopathy.  Neurological: She is alert and oriented to person, place, and time. No cranial nerve deficit.  No dysarthria, no aphasia  Skin: Skin is warm and dry. No rash noted. There is erythema (RLE over calf, with tenderness on palpation).  Psychiatric: She has a normal mood and affect. Her behavior is normal. Judgment and thought content normal.    LABORATORY PANEL:   CBC  Recent Labs Lab 07/09/14 1913  WBC 32.1*  HGB 9.6*  HCT 31.0*  PLT 190    ------------------------------------------------------------------------------------------------------------------  Chemistries   Recent Labs Lab 07/09/14 1913  NA 140  K 3.7  CL 105  CO2 27  GLUCOSE 115*  BUN 13  CREATININE 0.81  CALCIUM 8.6*  AST 43*  ALT 22  ALKPHOS 94  BILITOT 0.3   ------------------------------------------------------------------------------------------------------------------  Cardiac Enzymes No results for input(s): TROPONINI in the last 168 hours. ------------------------------------------------------------------------------------------------------------------  RADIOLOGY:  Dg Chest 2 View  07/09/2014   CLINICAL DATA:  Acute onset of fever and right lower leg rash. Initial encounter.  EXAM: CHEST  2 VIEW  COMPARISON:  Chest radiograph performed 11/28/2008, and CT of the chest performed 11/29/2008  FINDINGS: The lungs are well-aerated and clear. There is no evidence of focal opacification, pleural effusion or pneumothorax.  The heart is normal in size; the mediastinal contour is within normal limits. No acute osseous abnormalities are seen.  IMPRESSION: No acute cardiopulmonary process seen.   Electronically  Signed   By: Garald Balding M.D.   On: 07/09/2014 22:28   US Venous Img Lower Unilateral Right  07/09/2014   CLINICAL DATA:  Erythema and swelling at the right lower extremity for 1 day. Initial encounter.  EXAM: RIGHT LOWER EXTREMITY VENOUS DOPPLER ULTRASOUND  TECHNIQUE: Gray-scale sonography with graded compression, as well as color Doppler and duplex ultrasound were performed to evaluate the lower extremity deep venous systems from the level of the common femoral vein and including the common femoral, femoral, profunda femoral, popliteal and calf veins including the posterior tibial, peroneal and gastrocnemius veins when visible. The superficial great saphenous vein was also interrogated. Spectral Doppler was utilized to evaluate flow at rest and with  distal augmentation maneuvers in the common femoral, femoral and popliteal veins.  COMPARISON:  None.  FINDINGS: Contralateral Common Femoral Vein: Respiratory phasicity is normal and symmetric with the symptomatic side. No evidence of thrombus. Normal compressibility.  Common Femoral Vein: No evidence of thrombus. Normal compressibility, respiratory phasicity and response to augmentation.  Saphenofemoral Junction: No evidence of thrombus. Normal compressibility and flow on color Doppler imaging.  Profunda Femoral Vein: No evidence of thrombus. Normal compressibility and flow on color Doppler imaging.  Femoral Vein: No evidence of thrombus. Normal compressibility, respiratory phasicity and response to augmentation.  Popliteal Vein: Occlusive thrombus is noted within the right popliteal vein.  Calf Veins: Occlusive thrombus is noted within the posterior tibial vein. The peroneal vein is not visualized.  Superficial Great Saphenous Vein: No evidence of thrombus. Normal compressibility and flow on color Doppler imaging.  Venous Reflux:  None.  Other Findings:  None.  IMPRESSION: Occlusive deep venous thrombosis noted within the right popliteal vein and posterior tibial vein. Remaining calf veins are not visualized on this study.  Critical Value/emergent results were called by telephone at the time of interpretation on 07/09/2014 at 8:16 pm to [place], who verbally acknowledged these results.   Electronically Signed   By: Garald Balding M.D.   On: 07/09/2014 20:19    EKG:  No orders found for this or any previous visit.  IMPRESSION AND PLAN:  Principal Problem:   DVT (deep venous thrombosis) - likely due to the fact that her INR is subtherapeutic. Therapeutic Lovenox given to bridge her as her Coumadin dosage is adjusted until her INR is therapeutic again. Active Problems:   Cellulitis of leg, right - largely suspected due to her right lower extremity erythema in conjunction with her significantly  elevated white count. IV vancomycin given in the ED. We'll continue this antibiotic for now.   Subtherapeutic international normalized ratio (INR) - Lovenox bridging, adjust Coumadin until INR therapeutic   Mild mental retardation - stable condition   Seizures - stable, continue home meds for this  All the records are reviewed and case discussed with ED provider. Management plans discussed with the patient and/or family.  DVT PROPHYLAXIS: Systemic anticoagulation  ADMISSION STATUS: Observation  CODE STATUS: Full  TOTAL TIME TAKING CARE OF THIS PATIENT: 40 minutes.    Aidden Markovic [place] 07/09/2014, 11:38 PM  [facility]  Office  [phone]  CC: Primary care physician; Juluis Pitch, MD

## 2014-07-09 NOTE — ED Provider Notes (Signed)
[facility] Emergency Department Provider Note ____________________________________________  Time seen: Approximately 6:31 PM  I have reviewed the triage vital signs and the nursing notes.   HISTORY  Chief Complaint Rash   HPI Laurie Rice is a 54 y.o. female who presents to the emergency department for right lower extremity "rash." She was sent here by her care facility for evaluation. She denies pain, itching, or injury. Patient is unable to provide a clear history of recent illness.   Past Medical History  Diagnosis Date  . Seizures   . Mild mental retardation   . Meningioma   . History of DVT of lower extremity   . Personal history of pulmonary embolism   . History of lymphocytosis   . Monoclonal gammopathy     low level  . Anemia     chronic, mcv chronically low  . Cellulitis     History of  . Impetigo     History of  . Liver masses     History of, Benign  . Thyroid mass     history of    Patient Active Problem List   Diagnosis Date Noted  . Meningioma 07/07/2014  . Microcytic anemia 07/07/2014  . 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 No pertinent past surgical history.  Current Outpatient Rx  Name  Route  Sig  Dispense  Refill  . Emollient (CETAPHIL) cream      APPLY SMALL AMOUNT TO AFFECTED AREAS ONCE DAILY         . PHENobarbital (LUMINAL) 32.4 MG tablet   Oral   Take by mouth.         . phenytoin (DILANTIN) 100 MG ER capsule      TAKE 2 CAPSULES BY MOUTH AT BEDTIME (SEIZURE DISORDER)         . triamcinolone ointment (KENALOG) 0.1 %   Topical   Apply topically.         . warfarin (COUMADIN) 1 MG tablet      TAKE 2 TABLETS (2MG ) BY MOUTH ONCE DAILY (ALONG W/ 5MG =7MG  TOTAL DOSAGE).         Marland Kitchen warfarin (COUMADIN) 5 MG tablet      TAKE 1 TABLET BY MOUTH EVERY DAY (ALONG W/ 2MG =7MG  TOTAL DOSAGE).           Allergies Review of patient's allergies indicates no known  allergies.  No family history on file.  Social History History  Substance Use Topics  . Smoking status: Never Smoker   . Smokeless tobacco: Not on file  . Alcohol Use: No    Review of Systems Constitutional: No fever/chills Eyes: No visual changes. ENT: No sore throat. Cardiovascular: Denies chest pain. Respiratory: Denies shortness of breath. Gastrointestinal: No abdominal pain.  No nausea, no vomiting.  No diarrhea.  No constipation. Genitourinary: Negative for dysuria. Musculoskeletal: Negative for back pain. Skin: Negative for rash. Neurological: Negative for headaches, focal weakness or numbness.  10-point ROS otherwise negative.  ____________________________________________   PHYSICAL EXAM:  VITAL SIGNS: ED Triage Vitals  Enc Vitals Group     BP 07/09/14 1810 143/95 mmHg     Pulse Rate 07/09/14 1810 98     Resp 07/09/14 1810 16     Temp 07/09/14 1810 99.7 F (37.6 C)     Temp Source 07/09/14 1810 Oral     SpO2 07/09/14 1810 100 %     Weight 07/09/14 1810 198 lb (89.812 kg)     Height  07/09/14 1810 5\' 6"  (1.676 m)     Head Cir --      Peak Flow --      Pain Score 07/09/14 1811 0     Pain Loc --      Pain Edu? --      Excl. in [place]? --     Constitutional: Alert and oriented. Well appearing and in no acute distress. Eyes: Conjunctivae are normal. PERRL. EOMI. Head: Atraumatic. Nose: No congestion/rhinnorhea. Mouth/Throat: Mucous membranes are moist.  Oropharynx non-erythematous. Neck: No stridor.   Cardiovascular: Normal rate, regular rhythm. Grossly normal heart sounds.  Good peripheral circulation. Respiratory: Normal respiratory effort.  No retractions. Lungs CTAB. Gastrointestinal: Soft and nontender. No distention. No abdominal bruits. No CVA tenderness. Musculoskeletal: No lower extremity tenderness.  No joint effusions. Neurologic:  Normal speech and language. No gross focal neurologic deficits are appreciated. Speech is normal. No gait  instability. Skin:  Right lower extremity from knee to ankle is very erythematous and hot to touch.  Psychiatric: Mood and affect are at baseline. Patient is cooperative.  ____________________________________________   LABS (all labs ordered are listed, but only abnormal results are displayed)  Labs Reviewed  CBC WITH DIFFERENTIAL/PLATELET - Abnormal; Notable for the following:    WBC 32.1 (*)    Hemoglobin 9.6 (*)    HCT 31.0 (*)    MCV 69.2 (*)    MCH 21.5 (*)    MCHC 31.0 (*)    RDW 16.2 (*)    Neutro Abs 16.1 (*)    Lymphs Abs 13.8 (*)    Monocytes Absolute 1.6 (*)    All other components within normal limits  COMPREHENSIVE METABOLIC PANEL - Abnormal; Notable for the following:    Glucose, Bld 115 (*)    Calcium 8.6 (*)    Albumin 3.2 (*)    AST 43 (*)    All other components within normal limits  CK - Abnormal; Notable for the following:    Total CK 1193 (*)    All other components within normal limits  PROTIME-INR - Abnormal; Notable for the following:    Prothrombin Time 19.8 (*)    All other components within normal limits  CULTURE, BLOOD (ROUTINE X 2)  CULTURE, BLOOD (ROUTINE X 2)  URINALYSIS COMPLETEWITH MICROSCOPIC ([facility] ONLY)   ____________________________________________  EKG   ____________________________________________  RADIOLOGY  Pending upon admission ____________________________________________   PROCEDURES  Procedure(s) performed: None  Critical Care performed: No  ____________________________________________   INITIAL IMPRESSION / ASSESSMENT AND PLAN / ED COURSE  Pertinent labs & imaging results that were available during my care of the patient were reviewed by me and considered in my medical decision making (see chart for details).  Patient will be started on IV vancomycin and given Lovenox subcutaneous. She will be admitted to the hospital for further evaluation. ____________________________________________   FINAL CLINICAL  IMPRESSION(S) / ED DIAGNOSES  Final diagnoses:  Cellulitis of leg, right  Deep vein thrombosis (DVT), right  Non-traumatic rhabdomyolysis  Subtherapeutic anticoagulation      Victorino Dike, FNP 07/09/14 7672  Joanne Gavel, MD 07/13/14 0947

## 2014-07-09 NOTE — ED Notes (Signed)
Patient presents to the ED via EMS from [facility] with red rash to her right lower leg that was first noticed about 30 min ago.  Rash is warm to touch and red.  Patient denies any pain and itching.  Patient is in no obvious distress at this time.

## 2014-07-10 DIAGNOSIS — Z86718 Personal history of other venous thrombosis and embolism: Secondary | ICD-10-CM | POA: Diagnosis not present

## 2014-07-10 DIAGNOSIS — Z7901 Long term (current) use of anticoagulants: Secondary | ICD-10-CM | POA: Diagnosis not present

## 2014-07-10 DIAGNOSIS — F70 Mild intellectual disabilities: Secondary | ICD-10-CM | POA: Diagnosis present

## 2014-07-10 DIAGNOSIS — M6282 Rhabdomyolysis: Secondary | ICD-10-CM | POA: Diagnosis present

## 2014-07-10 DIAGNOSIS — Z79899 Other long term (current) drug therapy: Secondary | ICD-10-CM | POA: Diagnosis not present

## 2014-07-10 DIAGNOSIS — D649 Anemia, unspecified: Secondary | ICD-10-CM | POA: Diagnosis present

## 2014-07-10 DIAGNOSIS — I82431 Acute embolism and thrombosis of right popliteal vein: Secondary | ICD-10-CM | POA: Diagnosis present

## 2014-07-10 DIAGNOSIS — G40909 Epilepsy, unspecified, not intractable, without status epilepticus: Secondary | ICD-10-CM | POA: Diagnosis present

## 2014-07-10 DIAGNOSIS — Z86711 Personal history of pulmonary embolism: Secondary | ICD-10-CM | POA: Diagnosis not present

## 2014-07-10 DIAGNOSIS — I824Z1 Acute embolism and thrombosis of unspecified deep veins of right distal lower extremity: Secondary | ICD-10-CM | POA: Diagnosis present

## 2014-07-10 DIAGNOSIS — Z8249 Family history of ischemic heart disease and other diseases of the circulatory system: Secondary | ICD-10-CM | POA: Diagnosis not present

## 2014-07-10 DIAGNOSIS — L03115 Cellulitis of right lower limb: Secondary | ICD-10-CM | POA: Diagnosis present

## 2014-07-10 DIAGNOSIS — L0390 Cellulitis, unspecified: Secondary | ICD-10-CM | POA: Diagnosis present

## 2014-07-10 DIAGNOSIS — N390 Urinary tract infection, site not specified: Secondary | ICD-10-CM | POA: Diagnosis present

## 2014-07-10 LAB — BASIC METABOLIC PANEL
ANION GAP: 4 — AB (ref 5–15)
BUN: 10 mg/dL (ref 6–20)
CO2: 29 mmol/L (ref 22–32)
Calcium: 8.1 mg/dL — ABNORMAL LOW (ref 8.9–10.3)
Chloride: 109 mmol/L (ref 101–111)
Creatinine, Ser: 0.7 mg/dL (ref 0.44–1.00)
GFR calc Af Amer: >60 mL/min (ref >60)
GFR calc non Af Amer: >60 mL/min (ref >60)
GLUCOSE: 97 mg/dL (ref 65–99)
POTASSIUM: 3.3 mmol/L — AB (ref 3.5–5.1)
SODIUM: 142 mmol/L (ref 135–145)

## 2014-07-10 LAB — CBC
HCT: 28.8 % — ABNORMAL LOW (ref 35.0–47.0)
HEMOGLOBIN: 8.7 g/dL — AB (ref 12.0–16.0)
MCH: 20.9 pg — ABNORMAL LOW (ref 26.0–34.0)
MCHC: 30.3 g/dL — ABNORMAL LOW (ref 32.0–36.0)
MCV: 69 fL — AB (ref 80.0–100.0)
Platelets: 191 10*3/uL (ref 150–440)
RBC: 4.17 MIL/uL (ref 3.80–5.20)
RDW: 16.2 % — ABNORMAL HIGH (ref 11.5–14.5)
WBC: 21.8 10*3/uL — ABNORMAL HIGH (ref 3.6–11.0)

## 2014-07-10 LAB — PROTIME-INR
INR: 1.67
PROTHROMBIN TIME: 19.9 s — AB (ref 11.4–15.0)

## 2014-07-10 MED ORDER — ACETAMINOPHEN 650 MG RE SUPP: 650.0000 mg | Freq: Four times a day (QID) | RECTAL | Status: DC | PRN

## 2014-07-10 MED ORDER — SODIUM CHLORIDE 0.9 % IV SOLN
3.0000 g | Freq: Four times a day (QID) | INTRAVENOUS | Status: DC
  Administered 2014-07-10 – 2014-07-13 (×13): 3 g via INTRAVENOUS
  Filled 2014-07-10 (×17): qty 3

## 2014-07-10 MED ORDER — VANCOMYCIN HCL IN DEXTROSE 1-5 GM/200ML-% IV SOLN
1000.0000 mg | Freq: Two times a day (BID) | INTRAVENOUS | Status: DC
  Administered 2014-07-10 – 2014-07-11 (×3): 1000 mg via INTRAVENOUS
  Filled 2014-07-10 (×6): qty 200

## 2014-07-10 MED ORDER — ENOXAPARIN SODIUM 100 MG/ML ~~LOC~~ SOLN
90.0000 mg | Freq: Two times a day (BID) | SUBCUTANEOUS | Status: DC
  Administered 2014-07-10 – 2014-07-12 (×6): 90 mg via SUBCUTANEOUS
  Filled 2014-07-10 (×7): qty 1

## 2014-07-10 MED ORDER — PHENOBARBITAL 32.4 MG PO TABS
32.4000 mg | ORAL_TABLET | Freq: Two times a day (BID) | ORAL | Status: DC
  Administered 2014-07-10 – 2014-07-12 (×5): 32.4 mg via ORAL
  Filled 2014-07-10 (×5): qty 1

## 2014-07-10 MED ORDER — POTASSIUM CHLORIDE CRYS ER 20 MEQ PO TBCR
40.0000 meq | EXTENDED_RELEASE_TABLET | Freq: Once | ORAL | Status: AC
  Administered 2014-07-10: 40 meq via ORAL
  Filled 2014-07-10: qty 2

## 2014-07-10 MED ORDER — WARFARIN SODIUM 7.5 MG PO TABS
7.5000 mg | ORAL_TABLET | Freq: Every day | ORAL | Status: DC
  Administered 2014-07-10 – 2014-07-11 (×2): 7.5 mg via ORAL
  Filled 2014-07-10 (×2): qty 1

## 2014-07-10 MED ORDER — ACETAMINOPHEN 325 MG PO TABS: 650.0000 mg | ORAL_TABLET | Freq: Four times a day (QID) | ORAL | Status: DC | PRN

## 2014-07-10 MED ORDER — PHENYTOIN SODIUM EXTENDED 100 MG PO CAPS
200.0000 mg | ORAL_CAPSULE | Freq: Every day | ORAL | Status: DC
  Administered 2014-07-10 – 2014-07-12 (×4): 200 mg via ORAL
  Filled 2014-07-10 (×4): qty 2

## 2014-07-10 MED ORDER — HYDROCODONE-ACETAMINOPHEN 5-325 MG PO TABS: 1.0000 | ORAL_TABLET | ORAL | Status: DC | PRN

## 2014-07-10 MED ORDER — WARFARIN - PHARMACIST DOSING INPATIENT
Freq: Every day | Status: DC
  Administered 2014-07-10: 19:00:00

## 2014-07-10 NOTE — Clinical Social Work Note (Signed)
Clinical Social Work Assessment  Patient Details  Name: Laurie Rice MRN: [id number] Date of Birth: 25-Feb-1960  Date of referral:  07/10/14               Reason for consult:  Facility Placement (pt is from a [place] # [phone])                Permission sought to share information with:  Facility Sport and exercise psychologist, Family Supports Permission granted to share information::  Yes, Verbal Permission Granted  Name::        Agency::     Relationship::     Contact Information:     Housing/Transportation Living arrangements for the past 2 months:   ([place]) Source of Information:  Patient, Medical Team, Guardian Patient Interpreter Needed:  None Criminal Activity/Legal Involvement Pertinent to Current Situation/Hospitalization:  No - Comment as needed Significant Relationships:  Other Family Members Laurie Rice Laurie Rice) cousin [phone] or ([phone]) Lives with:  Facility Resident Do you feel safe going back to the place where you live?  Yes Need for family participation in patient care:  Yes (Comment)  Care giving concerns:  Pt's cousin stated that pt can either go back to [facility] or a SNF if needed.  "I just want what's best for her"   Facilities manager / plan:  CSW spoke to pt.  SHe was awake and oriented x4, however her speech was very child like.  She was polite and stated that she had a guardian named Laurie Rice.  CSW contacted Laurie Rice he is her cousin and only living relative.  Both are in agreeement with return to family care home when medically stable and if SNF if not needed.  CSW notified RN of [place] needs and asked for evaluation of mobility via nurse or PT.  CSW also notified facility that pt was here and current plan is to DC back to [facility]  Employment status:  Disabled (Comment on whether or not currently receiving Disability) Insurance information:  Medicare, [place] PT Recommendations:    Information / Referral to community  resources:     Patient/Family's Response to care: pt's guardian and pt were in favor of DC plan for [facility] or SNF if needed.  Patient/Family's Understanding of and Emotional Response to Diagnosis, Current Treatment, and Prognosis:  Pt's guardian and pt were also both in favor of DC back to [facility].  Both verbally stated there understanding.    Emotional Assessment Appearance:  Appears stated age Attitude/Demeanor/Rapport:   (child like behavior.) Affect (typically observed):  Flat Orientation:  Oriented to Self, Oriented to Place, Oriented to  Time, Oriented to Situation Alcohol / Substance use:  Never Used Psych involvement (Current and /or in the community):  No (Comment)  Discharge Needs  Concerns to be addressed:  Care Coordination, Cognitive Concerns Readmission within the last 30 days:  No Current discharge risk:  None Barriers to Discharge:  No Barriers Identified   Mathews Argyle, LCSW 07/10/2014, 10:22 AM

## 2014-07-10 NOTE — Progress Notes (Signed)
[place] at [facility]                                                                                                                                                                                            Patient Demographics   Laurie Rice, is a 54 y.o. female, DOB - Jul 22, 1960, XBJ:[id number]  Admit date - 07/09/2014   Admitting Physician Lance Coon, MD  Outpatient Primary MD for the patient is BRONSTEIN, DAVID, MD   LOS - 0  Subjective: She has right lower extremity erythema. She besides the rash on the legs she denies any chest pain or shortness of breath.     Review of Systems:   CONSTITUTIONAL: No documented fever. No fatigue, weakness. No weight gain, no weight loss.  EYES: No blurry or double vision.  ENT: No tinnitus. No postnasal drip. No redness of the oropharynx.  RESPIRATORY: No cough, no wheeze, no hemoptysis. No dyspnea.  CARDIOVASCULAR: No chest pain. No orthopnea. No palpitations. No syncope.  GASTROINTESTINAL: No nausea, no vomiting or diarrhea. No abdominal pain. No melena or hematochezia.  GENITOURINARY: No dysuria or hematuria.  ENDOCRINE: No polyuria or nocturia. No heat or cold intolerance.  HEMATOLOGY: No anemia. No bruising. No bleeding.  INTEGUMENTARY: Rash on the right lower extremity with erythema and warmth  MUSCULOSKELETAL: No arthritis. No swelling. No gout.  NEUROLOGIC: No numbness, tingling, or ataxia. No seizure-type activity.  PSYCHIATRIC: No anxiety. No insomnia. No ADD.    Vitals:   Filed Vitals:   07/10/14 0002 07/10/14 0109 07/10/14 0444 07/10/14 0814  BP: 124/65 133/71 117/74 139/67  Pulse: 80 77 81 91  Temp: 98.4 F (36.9 C) 99.9 F (37.7 C) 98.7 F (37.1 C) 98.6 F (37 C)  TempSrc:  Oral Oral Oral  Resp: 18 18 18 16   Height:      Weight:      SpO2: 99% 100% 100% 97%    Wt Readings from Last 3 Encounters:  07/09/14 89.812 kg (198 lb)  07/07/14 89.9 kg (198 lb 3.1 oz)      Intake/Output Summary (Last 24 hours) at 07/10/14 1209 Last data filed at 07/10/14 0916  Gross per 24 hour  Intake    360 ml  Output    650 ml  Net   -290 ml    Physical Exam:   GENERAL: Pleasant-appearing in no apparent distress.  HEAD, EYES, EARS, NOSE AND THROAT: Atraumatic, normocephalic. Extraocular muscles are intact. Pupils equal and reactive to light. Sclerae anicteric. No conjunctival injection. No oro-pharyngeal erythema.  NECK: Supple. There is no jugular venous distention. No bruits, no  lymphadenopathy, no thyromegaly.  HEART: Regular rate and rhythm, tachycardic. No murmurs, no rubs, no clicks.  LUNGS: Clear to auscultation bilaterally. No rales or rhonchi. No wheezes.  ABDOMEN: Soft, flat, nontender, nondistended. Has good bowel sounds. No hepatosplenomegaly appreciated.  EXTREMITIES: No evidence of any cyanosis, clubbing, or peripheral edema.  +2 pedal and radial pulses bilaterally.  NEUROLOGIC: The patient is alert, awake, and oriented x3 with no focal motor or sensory deficits appreciated bilaterally.  SKIN: Right lower extremity there is patchy area of erythema on her thighs as well as legs. Warm to touch  Psych: Not anxious, depressed LN: No inguinal LN enlargement    Antibiotics   Anti-infectives    Start     Dose/Rate Route Frequency Ordered Stop   07/10/14 0530  vancomycin (VANCOCIN) IVPB 1000 mg/200 mL premix     1,000 mg 200 mL/hr over 60 Minutes Intravenous Every 12 hours 07/10/14 0018     07/09/14 2215  vancomycin (VANCOCIN) IVPB 1000 mg/200 mL premix     1,000 mg 200 mL/hr over 60 Minutes Intravenous  Once 07/09/14 2205 07/10/14 0030      Medications   Scheduled Meds: . enoxaparin  90 mg Subcutaneous Q12H  . PHENobarbital  32.4 mg Oral BID  . phenytoin  200 mg Oral QHS  . vancomycin  1,000 mg Intravenous Q12H  . warfarin  7.5 mg Oral q1800  . Warfarin - Pharmacist Dosing Inpatient   Does not apply q1800   Continuous Infusions:  PRN  Meds:.acetaminophen **OR** acetaminophen, HYDROcodone-acetaminophen   Data Review:   Micro Results Recent Results (from the past 240 hour(s))  Culture, blood (routine x 2)     Status: None (Preliminary result)   Collection Time: 07/09/14  7:02 PM  Result Value Ref Range Status   Specimen Description BLOOD  Final   Special Requests BOTTLES DRAWN AEROBIC AND ANAEROBIC 2MLAER,5MLANA  Final   Culture NO GROWTH < 12 HOURS  Final   Report Status PENDING  Incomplete  Culture, blood (routine x 2)     Status: None (Preliminary result)   Collection Time: 07/09/14  7:13 PM  Result Value Ref Range Status   Specimen Description BLOOD  Final   Special Requests [id number]  Final   Culture NO GROWTH < 12 HOURS  Final   Report Status PENDING  Incomplete    Radiology Reports Dg Chest 2 View  07/09/2014   CLINICAL DATA:  Acute onset of fever and right lower leg rash. Initial encounter.  EXAM: CHEST  2 VIEW  COMPARISON:  Chest radiograph performed 11/28/2008, and CT of the chest performed 11/29/2008  FINDINGS: The lungs are well-aerated and clear. There is no evidence of focal opacification, pleural effusion or pneumothorax.  The heart is normal in size; the mediastinal contour is within normal limits. No acute osseous abnormalities are seen.  IMPRESSION: No acute cardiopulmonary process seen.   Electronically Signed   By: Garald Balding M.D.   On: 07/09/2014 22:28   US Venous Img Lower Unilateral Right  07/09/2014   CLINICAL DATA:  Erythema and swelling at the right lower extremity for 1 day. Initial encounter.  EXAM: RIGHT LOWER EXTREMITY VENOUS DOPPLER ULTRASOUND  TECHNIQUE: Gray-scale sonography with graded compression, as well as color Doppler and duplex ultrasound were performed to evaluate the lower extremity deep venous systems from the level of the common femoral vein and including the common femoral, femoral, profunda femoral, popliteal and calf veins including the posterior tibial, peroneal and  gastrocnemius veins  when visible. The superficial great saphenous vein was also interrogated. Spectral Doppler was utilized to evaluate flow at rest and with distal augmentation maneuvers in the common femoral, femoral and popliteal veins.  COMPARISON:  None.  FINDINGS: Contralateral Common Femoral Vein: Respiratory phasicity is normal and symmetric with the symptomatic side. No evidence of thrombus. Normal compressibility.  Common Femoral Vein: No evidence of thrombus. Normal compressibility, respiratory phasicity and response to augmentation.  Saphenofemoral Junction: No evidence of thrombus. Normal compressibility and flow on color Doppler imaging.  Profunda Femoral Vein: No evidence of thrombus. Normal compressibility and flow on color Doppler imaging.  Femoral Vein: No evidence of thrombus. Normal compressibility, respiratory phasicity and response to augmentation.  Popliteal Vein: Occlusive thrombus is noted within the right popliteal vein.  Calf Veins: Occlusive thrombus is noted within the posterior tibial vein. The peroneal vein is not visualized.  Superficial Great Saphenous Vein: No evidence of thrombus. Normal compressibility and flow on color Doppler imaging.  Venous Reflux:  None.  Other Findings:  None.  IMPRESSION: Occlusive deep venous thrombosis noted within the right popliteal vein and posterior tibial vein. Remaining calf veins are not visualized on this study.  Critical Value/emergent results were called by telephone at the time of interpretation on 07/09/2014 at 8:16 pm to [place], who verbally acknowledged these results.   Electronically Signed   By: Garald Balding M.D.   On: 07/09/2014 20:19   Mm Digital Screening Bilateral  07/07/2014   CLINICAL DATA:  Screening.  EXAM: DIGITAL SCREENING BILATERAL MAMMOGRAM WITH CAD  COMPARISON:  Previous exam(s).  ACR Breast Density Category c: The breast tissue is heterogeneously dense, which may obscure small masses.  FINDINGS: There are no  findings suspicious for malignancy. Images were processed with CAD.  IMPRESSION: No mammographic evidence of malignancy. A result letter of this screening mammogram will be mailed directly to the patient.  RECOMMENDATION: Screening mammogram in one year. (Code:SM-B-01Y)  BI-RADS CATEGORY  1: Negative.   Electronically Signed   By: Pamelia Hoit M.D.   On: 07/07/2014 18:46     CBC  Recent Labs Lab 07/07/14 1243 07/09/14 1913 07/10/14 0517  WBC 22.8* 32.1* 21.8*  HGB 10.0* 9.6* 8.7*  HCT 33.4* 31.0* 28.8*  PLT 275 190 191  MCV 69.5* 69.2* 69.0*  MCH 20.7* 21.5* 20.9*  MCHC 29.8* 31.0* 30.3*  RDW 16.4* 16.2* 16.2*  LYMPHSABS 14.6* 13.8*  --   MONOABS 0.9 1.6*  --   EOSABS 0.2 0.6  --   BASOSABS 0.1 0.0  --     Chemistries   Recent Labs Lab 07/07/14 1243 07/09/14 1913 07/10/14 0517  NA 135 140 142  K 3.9 3.7 3.3*  CL 102 105 109  CO2 27 27 29   GLUCOSE 83 115* 97  BUN 11 13 10   CREATININE 0.69 0.81 0.70  CALCIUM 8.3* 8.6* 8.1*  AST 17 43*  --   ALT 13* 22  --   ALKPHOS 121 94  --   BILITOT 0.2* 0.3  --    ------------------------------------------------------------------------------------------------------------------ estimated creatinine clearance is 89.7 mL/min (by C-G formula based on Cr of 0.7). ------------------------------------------------------------------------------------------------------------------ No results for input(s): HGBA1C in the last 72 hours. ------------------------------------------------------------------------------------------------------------------ No results for input(s): CHOL, HDL, LDLCALC, TRIG, CHOLHDL, LDLDIRECT in the last 72 hours. ------------------------------------------------------------------------------------------------------------------ No results for input(s): TSH, T4TOTAL, T3FREE, THYROIDAB in the last 72 hours.  Invalid input(s):  FREET3 ------------------------------------------------------------------------------------------------------------------  Recent Labs  07/07/14 1243  FERRITIN 34  TIBC 212*  IRON 68  RETICCTPCT 1.2    Coagulation profile  Recent Labs Lab 07/09/14 1913 07/10/14 0517  INR 1.66 1.67    No results for input(s): DDIMER in the last 72 hours.  Cardiac Enzymes No results for input(s): CKMB, TROPONINI, MYOGLOBIN in the last 168 hours.  Invalid input(s): CK ------------------------------------------------------------------------------------------------------------------ Invalid input(s): [place]    1.  DVT (deep venous thrombosis) - likely due to the fact that her INR is subtherapeutic. Continue therapeutic dose Lovenox until INR is therapeutic  2.  Cellulitis of leg, right -continue IV vancomycin and I'll add Unasyn to her current regimen 3.  Seizures - stable, continue continue phenobarbital 4. Chronic anemia follow hemoglobin 5. Mild mental retardation      Code Status Orders        Start     Ordered   07/10/14 0002  Full code   Continuous     07/10/14 0001    Advance Directive Documentation        Most Recent Value   Type of Advance Directive  Healthcare Power of Attorney   Pre-existing out of facility DNR order (yellow form or pink MOST form)     "MOST" Form in Place?             Consults  none   DVT Prophylaxis  Lovenox   Lab Results  Component Value Date   PLT 191 07/10/2014     Time Spent in minutes  45 minutes  Greater than 50% of time spent in care coordination and counseling.   Dustin Flock M.D on 07/10/2014 at 12:09 PM  Between 7am to 6pm - Pager - [phone]  After 6pm go to [email or web address] - password [email or web address] [place] [facility]   Office  [phone]

## 2014-07-10 NOTE — Clinical Social Work Note (Signed)
FL2 for family care home is on pt's chart.  [facility] fx# is [phone].

## 2014-07-10 NOTE — Progress Notes (Signed)
ANTICOAGULATION CONSULT NOTE - Follow up  Pharmacy Consult for Coumadin Indication: DVT  No Known Allergies  Patient Measurements: Height: 5\' 6"  (167.6 cm) Weight: 198 lb (89.812 kg) IBW/kg (Calculated) : 59.3   Vital Signs: Temp: 98.6 F (37 C) (07/08 0814) Temp Source: Oral (07/08 0814) BP: 139/67 mmHg (07/08 0814) Pulse Rate: 91 (07/08 0814)  Labs:  Recent Labs  07/07/14 1243 07/09/14 1913 07/10/14 0517  HGB 10.0* 9.6* 8.7*  HCT 33.4* 31.0* 28.8*  PLT 275 190 191  LABPROT  --  19.8* 19.9*  INR  --  1.66 1.67  CREATININE 0.69 0.81 0.70  CKTOTAL  --  1193*  --     Estimated Creatinine Clearance: 89.7 mL/min (by C-G formula based on Cr of 0.7).   Medical History: Past Medical History  Diagnosis Date  . Seizures   . Mild mental retardation   . Meningioma   . 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 Personal history of pulmonary embolism   . History of lymphocytosis   . Monoclonal gammopathy     low level  . Anemia     chronic, mcv chronically low  . Cellulitis     History of  . Impetigo     History of  . Liver masses     History of, Benign  . Thyroid mass     history of    Medications:  Scheduled:  . enoxaparin  90 mg Subcutaneous Q12H  . PHENobarbital  32.4 mg Oral BID  . phenytoin  200 mg Oral QHS  . vancomycin  1,000 mg Intravenous Q12H  . warfarin  7.5 mg Oral q1800  . Warfarin - Pharmacist Dosing Inpatient   Does not apply q1800   Infusions:    PRN: acetaminophen **OR** acetaminophen, HYDROcodone-acetaminophen  Assessment: Patient with a h/o B/L DVTs on Coumadin with subtherapeutic INR admitted with DVT. PTA dosing of Coumadin 7 mg daily. Patient apparently received the last dose of Coumadin 7/5.   Goal of Therapy:  INR 2-3   Plan:  Will continue order for Coumadin 7.5 mg daily. First dose of this new regimen will be 7/8 as Coumadin missed 7/7. Patient is on Lovenox 1mg /kg bridge. Will f/u INR.   Chinita Greenland PharmD Clinical Pharmacist 07/10/2014

## 2014-07-10 NOTE — Progress Notes (Addendum)
ANTIBIOTIC CONSULT NOTE - INITIAL  Pharmacy Consult for V ancomycin Indication: Cellulitis  No Known Allergies  Patient Measurements: Height: 5\' 6"  (167.6 cm) Weight: 198 lb (89.812 kg) IBW/kg (Calculated) : 59.3 Adjusted Body Weight: 71.5 kg  Vital Signs: Temp: 100 F (37.8 C) (07/07 2128) Temp Source: Oral (07/07 2128) BP: 124/65 mmHg (07/08 0002) Pulse Rate: 80 (07/08 0002) Intake/Output from previous day:   Intake/Output from this shift:    Labs:  Recent Labs  07/07/14 1243 07/09/14 1913  WBC 22.8* 32.1*  HGB 10.0* 9.6*  PLT 275 190  CREATININE 0.69 0.81   Estimated Creatinine Clearance: 88.6 mL/min (by C-G formula based on Cr of 0.81). No results for input(s): VANCOTROUGH, VANCOPEAK, VANCORANDOM, GENTTROUGH, GENTPEAK, GENTRANDOM, TOBRATROUGH, TOBRAPEAK, TOBRARND, AMIKACINPEAK, AMIKACINTROU, AMIKACIN in the last 72 hours.   Kinetics:   Ke: 0.078 Vd: 50   Microbiology: No results found for this or any previous visit (from the past 720 hour(s)).  Medical History: Past Medical History  Diagnosis Date  . Seizures   . Mild mental retardation   . Meningioma   . 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 Personal history of pulmonary embolism   . History of lymphocytosis   . Monoclonal gammopathy     low level  . Anemia     chronic, mcv chronically low  . Cellulitis     History of  . Impetigo     History of  . Liver masses     History of, Benign  . Thyroid mass     history of    Medications:  Scheduled:  . enoxaparin  90 mg Subcutaneous Q12H  . PHENobarbital  32.4 mg Oral BID  . phenytoin  200 mg Oral QHS  . warfarin  7.5 mg Oral q1800   Infusions:  . vancomycin     PRN: acetaminophen **OR** acetaminophen, HYDROcodone-acetaminophen  Assessment: Patient with DVT and possible superimposed RLE cellulitis ordered vancomycin.   Goal of Therapy:  Vancomycin trough level 15-20 mcg/ml  Plan:  Vancomycin 1000 mg iv q  12  h with stacked dosing and a trough with the 5th dose.   Ulice Dash D 07/10/2014,12:19 AM

## 2014-07-10 NOTE — Progress Notes (Signed)
ANTICOAGULATION CONSULT NOTE - Initial Consult  Pharmacy Consult for Coumadin Indication: DVT  No Known Allergies  Patient Measurements: Height: 5\' 6"  (167.6 cm) Weight: 198 lb (89.812 kg) IBW/kg (Calculated) : 59.3   Vital Signs: Temp: 100 F (37.8 C) (07/07 2128) Temp Source: Oral (07/07 2128) BP: 124/65 mmHg (07/08 0002) Pulse Rate: 80 (07/08 0002)  Labs:  Recent Labs  07/07/14 1243 07/09/14 1913  HGB 10.0* 9.6*  HCT 33.4* 31.0*  PLT 275 190  LABPROT  --  19.8*  INR  --  1.66  CREATININE 0.69 0.81  CKTOTAL  --  1193*    Estimated Creatinine Clearance: 88.6 mL/min (by C-G formula based on Cr of 0.81).   Medical History: Past Medical History  Diagnosis Date  . Seizures   . Mild mental retardation   . Meningioma   . 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 Personal history of pulmonary embolism   . History of lymphocytosis   . Monoclonal gammopathy     low level  . Anemia     chronic, mcv chronically low  . Cellulitis     History of  . Impetigo     History of  . Liver masses     History of, Benign  . Thyroid mass     history of    Medications:  Scheduled:  . enoxaparin  90 mg Subcutaneous Q12H  . PHENobarbital  32.4 mg Oral BID  . phenytoin  200 mg Oral QHS  . warfarin  7.5 mg Oral q1800   Infusions:  . vancomycin     PRN: acetaminophen **OR** acetaminophen, HYDROcodone-acetaminophen  Assessment: Patient with a h/o B/L DVTs on Coumadin with subtherapeutic INR admitted with DVT. PTA dosing of Coumadin 7 mg daily. Patient apparently received the last dose of Coumadin 7/5.   Goal of Therapy:  INR 2-3   Plan:  Agree with MD order for Coumadin 7.5 mg daily. First dose of this new regimen will be 7/8 as Coumadin missed 7/7. Patient is on Lovenox bridge. Will f/u INR.    Ulice Dash D 07/10/2014,12:24 AM

## 2014-07-10 NOTE — Progress Notes (Signed)
ANTIBIOTIC CONSULT NOTE -Follow up  Pharmacy Consult for Vancomycin Indication: Cellulitis  No Known Allergies  Patient Measurements: Height: 5\' 6"  (167.6 cm) Weight: 198 lb (89.812 kg) IBW/kg (Calculated) : 59.3 Adjusted Body Weight: 71.5 kg  Vital Signs: Temp: 98.6 F (37 C) (07/08 0814) Temp Source: Oral (07/08 0814) BP: 139/67 mmHg (07/08 0814) Pulse Rate: 91 (07/08 0814) Intake/Output from previous day: 07/07 0701 - 07/08 0700 In: -  Out: 350 [Urine:350] Intake/Output from this shift: Total I/O In: 120 [P.O.:120] Out: 0   Labs:  Recent Labs  07/07/14 1243 07/09/14 1913 07/10/14 0517  WBC 22.8* 32.1* 21.8*  HGB 10.0* 9.6* 8.7*  PLT 275 190 191  CREATININE 0.69 0.81 0.70   Estimated Creatinine Clearance: 89.7 mL/min (by C-G formula based on Cr of 0.7).  Kinetics:   Ke: 0.078 Vd: 50   Microbiology: Recent Results (from the past 720 hour(s))  Culture, blood (routine x 2)     Status: None (Preliminary result)   Collection Time: 07/09/14  7:02 PM  Result Value Ref Range Status   Specimen Description BLOOD  Final   Special Requests BOTTLES DRAWN AEROBIC AND ANAEROBIC 2MLAER,5MLANA  Final   Culture NO GROWTH < 12 HOURS  Final   Report Status PENDING  Incomplete  Culture, blood (routine x 2)     Status: None (Preliminary result)   Collection Time: 07/09/14  7:13 PM  Result Value Ref Range Status   Specimen Description BLOOD  Final   Special Requests [id number]  Final   Culture NO GROWTH < 12 HOURS  Final   Report Status PENDING  Incomplete    Medical History: Past Medical History  Diagnosis Date  . Seizures   . Mild mental retardation   . Meningioma   . 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 Personal history of pulmonary embolism   . History of lymphocytosis   . Monoclonal gammopathy     low level  . Anemia     chronic, mcv chronically low  . Cellulitis     History of  . Impetigo     History of  . Liver masses    History of, Benign  . Thyroid mass     history of    Medications:  Scheduled:  . enoxaparin  90 mg Subcutaneous Q12H  . PHENobarbital  32.4 mg Oral BID  . phenytoin  200 mg Oral QHS  . vancomycin  1,000 mg Intravenous Q12H  . warfarin  7.5 mg Oral q1800  . Warfarin - Pharmacist Dosing Inpatient   Does not apply q1800   Infusions:    PRN: acetaminophen **OR** acetaminophen, HYDROcodone-acetaminophen  Assessment: Patient with DVT and possible superimposed RLE cellulitis ordered vancomycin.   Goal of Therapy:  Vancomycin trough level 10-15 mcg/ml  Plan:  Vancomycin 1000 mg iv q 12 h with stacked dosing and a trough with the 5th dose on 7/10 at 0500.  Chinita Greenland PharmD Clinical Pharmacist 07/10/2014 ,8:46 AM

## 2014-07-10 NOTE — Care Management (Signed)
Patient appears to be from [place]. They use [facility]. I have sent referral to [facility] for [facility]: PT/INR, Skin observation, and Lovenox teaching. Jobe Marker (owner) states she can administer Lovenox if RN teaches her. RNCM will continue to follow along with Caryl Pina CSW.

## 2014-07-11 LAB — BASIC METABOLIC PANEL
Anion gap: 5 (ref 5–15)
BUN: 10 mg/dL (ref 6–20)
CHLORIDE: 110 mmol/L (ref 101–111)
CO2: 25 mmol/L (ref 22–32)
CREATININE: 0.55 mg/dL (ref 0.44–1.00)
Calcium: 8.1 mg/dL — ABNORMAL LOW (ref 8.9–10.3)
GFR calc Af Amer: >60 mL/min (ref >60)
GFR calc non Af Amer: >60 mL/min (ref >60)
Glucose, Bld: 87 mg/dL (ref 65–99)
Potassium: 4 mmol/L (ref 3.5–5.1)
Sodium: 140 mmol/L (ref 135–145)

## 2014-07-11 LAB — CBC
HCT: 27.7 % — ABNORMAL LOW (ref 35.0–47.0)
Hemoglobin: 8.7 g/dL — ABNORMAL LOW (ref 12.0–16.0)
MCH: 21.5 pg — ABNORMAL LOW (ref 26.0–34.0)
MCHC: 31.4 g/dL — ABNORMAL LOW (ref 32.0–36.0)
MCV: 68.6 fL — ABNORMAL LOW (ref 80.0–100.0)
Platelets: 201 K/uL (ref 150–440)
RBC: 4.04 MIL/uL (ref 3.80–5.20)
RDW: 16 % — ABNORMAL HIGH (ref 11.5–14.5)
WBC: 19.9 K/uL — ABNORMAL HIGH (ref 3.6–11.0)

## 2014-07-11 LAB — PROTIME-INR
INR: 1.35
Prothrombin Time: 16.9 s — ABNORMAL HIGH (ref 11.4–15.0)

## 2014-07-11 LAB — URINE CULTURE

## 2014-07-11 MED ORDER — LEVOFLOXACIN 500 MG PO TABS
500.0000 mg | ORAL_TABLET | Freq: Every day | ORAL | Status: DC
  Administered 2014-07-11 – 2014-07-13 (×3): 500 mg via ORAL
  Filled 2014-07-11 (×3): qty 1

## 2014-07-11 NOTE — Progress Notes (Signed)
ANTICOAGULATION CONSULT NOTE - Follow up  Pharmacy Consult for Coumadin Indication: DVT  No Known Allergies  Patient Measurements: Height: 5\' 6"  (167.6 cm) Weight: 198 lb (89.812 kg) IBW/kg (Calculated) : 59.3   Vital Signs: Temp: 99.3 F (37.4 C) (07/09 0736) Temp Source: Oral (07/09 0736) BP: 143/75 mmHg (07/09 0736) Pulse Rate: 76 (07/09 0736)  Labs:  Recent Labs  07/09/14 1913 07/10/14 0517 07/11/14 0444  HGB 9.6* 8.7* 8.7*  HCT 31.0* 28.8* 27.7*  PLT 190 191 201  LABPROT 19.8* 19.9* 16.9*  INR 1.66 1.67 1.35  CREATININE 0.81 0.70 0.55  CKTOTAL 1193*  --   --     Estimated Creatinine Clearance: 89.7 mL/min (by C-G formula based on Cr of 0.55).   Medical History: Past Medical History  Diagnosis Date  . Seizures   . Mild mental retardation   . Meningioma   . 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 Personal history of pulmonary embolism   . History of lymphocytosis   . Monoclonal gammopathy     low level  . Anemia     chronic, mcv chronically low  . Cellulitis     History of  . Impetigo     History of  . Liver masses     History of, Benign  . Thyroid mass     history of    Medications:  Scheduled:  . ampicillin-sulbactam (UNASYN) IV  3 g Intravenous Q6H  . enoxaparin  90 mg Subcutaneous Q12H  . PHENobarbital  32.4 mg Oral BID  . phenytoin  200 mg Oral QHS  . vancomycin  1,000 mg Intravenous Q12H  . warfarin  7.5 mg Oral q1800  . Warfarin - Pharmacist Dosing Inpatient   Does not apply q1800   Infusions:    PRN: acetaminophen **OR** acetaminophen, HYDROcodone-acetaminophen  Assessment: Patient with a h/o B/L DVTs on Coumadin with subtherapeutic INR admitted with DVT. PTA dosing of Coumadin 7 mg daily.   INR History: 7/7 INR: 1.66, no warfarin given 7/8 INR: 1.67, wafarin 7.5 mg 7/9 INR: 1.35  Goal of Therapy:  INR 2-3   Plan:  Will continue order for Coumadin 7.5 mg daily. Dose missed on 7/7.  Will continue  this dose as patient has only received 1 dose of 7.5 mg.  Patient is on Lovenox 1mg /kg bridge. Will f/u INR in AM to assess for changes in dosing.  Murrell Converse, PharmD Clinical Pharmacist 07/11/2014

## 2014-07-11 NOTE — Outcomes Assessment (Signed)
VSS, patient is A+O with no signs of distress. Denies pain at this time. Appears to have slept well.

## 2014-07-11 NOTE — Progress Notes (Signed)
Patient's VSS this shift & she is without complaints. Continue to monitor.

## 2014-07-12 LAB — PROTIME-INR
INR: 1.31
Prothrombin Time: 16.5 seconds — ABNORMAL HIGH (ref 11.4–15.0)

## 2014-07-12 MED ORDER — ENOXAPARIN SODIUM 100 MG/ML ~~LOC~~ SOLN
1.0000 mg/kg | Freq: Two times a day (BID) | SUBCUTANEOUS | Status: DC
Start: 2014-07-13 — End: 2014-07-13
  Administered 2014-07-13 (×2): 90 mg via SUBCUTANEOUS
  Filled 2014-07-12 (×3): qty 1

## 2014-07-12 MED ORDER — WARFARIN - PHARMACIST DOSING INPATIENT: Freq: Every day | Status: DC

## 2014-07-12 MED ORDER — WARFARIN SODIUM 2.5 MG PO TABS: 2.5000 mg | ORAL_TABLET | Freq: Once | ORAL | Status: DC

## 2014-07-12 MED ORDER — ENOXAPARIN SODIUM 100 MG/ML ~~LOC~~ SOLN: 90.0000 mg | Freq: Two times a day (BID) | SUBCUTANEOUS | Status: DC

## 2014-07-12 MED ORDER — PHENOBARBITAL 32.4 MG PO TABS
32.4000 mg | ORAL_TABLET | Freq: Once | ORAL | Status: AC
  Administered 2014-07-12: 32.4 mg via ORAL
  Filled 2014-07-12: qty 1

## 2014-07-12 MED ORDER — WARFARIN SODIUM 2.5 MG PO TABS
2.5000 mg | ORAL_TABLET | Freq: Once | ORAL | Status: AC
  Administered 2014-07-12: 2.5 mg via ORAL
  Filled 2014-07-12: qty 1

## 2014-07-12 MED ORDER — PHENYTOIN 50 MG PO CHEW
50.0000 mg | CHEWABLE_TABLET | Freq: Every day | ORAL | Status: DC
  Administered 2014-07-12: 50 mg via ORAL
  Filled 2014-07-12 (×2): qty 1

## 2014-07-12 MED ORDER — HYDROCODONE-ACETAMINOPHEN 5-325 MG PO TABS: 1.0000 | ORAL_TABLET | Freq: Four times a day (QID) | ORAL | Status: DC | PRN

## 2014-07-12 MED ORDER — RIVAROXABAN (XARELTO) VTE STARTER PACK (15 & 20 MG): ORAL_TABLET | ORAL | Status: DC

## 2014-07-12 MED ORDER — RIVAROXABAN 15 MG PO TABS
15.0000 mg | ORAL_TABLET | Freq: Two times a day (BID) | ORAL | Status: DC
  Filled 2014-07-12 (×2): qty 1

## 2014-07-12 MED ORDER — AMOXICILLIN-POT CLAVULANATE 875-125 MG PO TABS: 1.0000 | ORAL_TABLET | Freq: Two times a day (BID) | ORAL | Status: DC

## 2014-07-12 MED ORDER — LEVOFLOXACIN 500 MG PO TABS: 500.0000 mg | ORAL_TABLET | Freq: Every day | ORAL | Status: DC

## 2014-07-12 MED ORDER — PHENOBARBITAL 32.4 MG PO TABS: 64.8000 mg | ORAL_TABLET | Freq: Every day | ORAL | Status: DC

## 2014-07-12 MED ORDER — WARFARIN SODIUM 7.5 MG PO TABS
7.5000 mg | ORAL_TABLET | Freq: Every day | ORAL | Status: DC
  Administered 2014-07-12: 7.5 mg via ORAL
  Filled 2014-07-12: qty 1

## 2014-07-12 NOTE — Discharge Summary (Addendum)
[facility] [place] NAME: Laurie Rice    MR#:  [id number]  DATE OF BIRTH:  1960-07-25  DATE OF ADMISSION:  07/09/2014 ADMITTING PHYSICIAN: Lance Coon, MD  DATE OF DISCHARGE: 07/13/2014  PRIMARY CARE PHYSICIAN: Juluis Pitch, MD    ADMISSION DIAGNOSIS:  Cellulitis of leg, right [L03.115] Deep vein thrombosis (DVT), right [I82.401] Non-traumatic rhabdomyolysis [M62.82] Subtherapeutic anticoagulation [Z51.81, Z79.01]  DISCHARGE DIAGNOSIS:  Principal Problem:   DVT (deep venous thrombosis) Active Problems:   Cellulitis of leg, right   Mild mental retardation   Seizures   Subtherapeutic international normalized ratio (INR)   Cellulitis   UTI  SECONDARY DIAGNOSIS:   Past Medical History  Diagnosis Date  . Seizures   . Mild mental retardation   . Meningioma   . 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 Personal history of pulmonary embolism   . History of lymphocytosis   . Monoclonal gammopathy     low level  . Anemia     chronic, mcv chronically low  . Cellulitis     History of  . Impetigo     History of  . Liver masses     History of, Benign  . Thyroid mass     history of    HOSPITAL COURSE:   1. DVT (deep venous thrombosis) - likely due to the fact that her INR is subtherapeutic. Continue therapeutic dose Lovenox until INR is therapeutic , Can not swich to xarelto due to 8interaction with phenytoin.  INR is low- she need Lovenox injections BID for 4-5 days and meanwhile to recheck INR to adjust coumadin dose.    2. Cellulitis of leg, right -initially given but stop today-IV vancomycin   Cont unasyn. 3.  Seizures - stable, continue continue phenobarbital and phenytoin.    She was given 200 mg phenytoin in hospital at nights- due to initial miscommunication about her pre-admission dose.   Later when found correct dose ( 250 mg HS) - it resumed. She didn't had any  seizures events in hospital. 4. Chronic anemia follow hemoglobin- stable. 5. Mild mental retardation 6. UTI- levaquin,  Case manager are able to arrange her a visiting nurse and the owner of the group home giving her injections lovenox- so she will be discharged today.  DISCHARGE CONDITIONS:   Stable.  CONSULTS OBTAINED:   None.  DRUG ALLERGIES:  No Known Allergies  DISCHARGE MEDICATIONS:   Current Discharge Medication List    START taking these medications   Details  amoxicillin-clavulanate (AUGMENTIN) 875-125 MG per tablet Take 1 tablet by mouth 2 (two) times daily. Qty: 10 tablet, Refills: 0    enoxaparin (LOVENOX) 100 MG/ML injection Inject 0.9 mLs (90 mg total) into the skin every 12 (twelve) hours. Qty: 10 mL, Refills: 0    levofloxacin (LEVAQUIN) 500 MG tablet Take 1 tablet (500 mg total) by mouth daily. Qty: 3 tablet, Refills: 0      CONTINUE these medications which have NOT CHANGED   Details  phenytoin (DILANTIN) 50 MG tablet Chew 50 mg by mouth at bedtime.    Emollient (CETAPHIL) cream APPLY SMALL AMOUNT TO AFFECTED AREAS ONCE DAILY    PHENobarbital (LUMINAL) 32.4 MG tablet Take 64.8 mg by mouth at bedtime.     phenytoin (DILANTIN) 100 MG ER capsule TAKE 2 CAPSULES BY MOUTH AT BEDTIME (SEIZURE DISORDER)    triamcinolone ointment (KENALOG) 0.1 % Apply topically.    !!  warfarin (COUMADIN) 1 MG tablet TAKE 2 TABLETS (2MG ) BY MOUTH ONCE DAILY (ALONG W/ 5MG =7MG  TOTAL DOSAGE).    !! warfarin (COUMADIN) 5 MG tablet TAKE 1 TABLET BY MOUTH EVERY DAY (ALONG W/ 2MG =7MG  TOTAL DOSAGE).     !! - Potential duplicate medications found. Please discuss with provider.       DISCHARGE INSTRUCTIONS:    Regular follow ups with PMD.  If you experience worsening of your admission symptoms, develop shortness of breath, life threatening emergency, suicidal or homicidal thoughts you must seek medical attention immediately by calling 911 or calling your MD immediately  if  symptoms less severe.  You Must read complete instructions/literature along with all the possible adverse reactions/side effects for all the Medicines you take and that have been prescribed to you. Take any new Medicines after you have completely understood and accept all the possible adverse reactions/side effects.   Please note  You were cared for by a hospitalist during your hospital stay. If you have any questions about your discharge medications or the care you received while you were in the hospital after you are discharged, you can call the unit and asked to speak with the hospitalist on call if the hospitalist that took care of you is not available. Once you are discharged, your primary care physician will handle any further medical issues. Please note that NO REFILLS for any discharge medications will be authorized once you are discharged, as it is imperative that you return to your primary care physician (or establish a relationship with a primary care physician if you do not have one) for your aftercare needs so that they can reassess your need for medications and monitor your lab values.    Today   CHIEF COMPLAINT:   Chief Complaint  Patient presents with  . Rash    HISTORY OF PRESENT ILLNESS:  Laurie Rice  is a 54 y.o. female  who presents with right lower extremity erythema and pain. She has a history of bilateral DVTs and is on Coumadin. She began developing right lower shunt erythema and pain couple of days ago, and was sent today by her care facility for evaluation. She denies any fever or chills, or infectious etiology. She states that the redness, swelling and pain have been slowly progressing. In the ED she was evaluated and found to have an elevated white count 30,000, and Doppler of the lower extremity showed a new DVT in the popliteal and smaller calf veins. Given her elevated white count, there was some concern for superimposed cellulitis. She was given vancomycin.  Her INR was also found to be subtherapeutic despite her Coumadin dosing. She was given Lovenox, and hospitalists were called for admission.   VITAL SIGNS:  Blood pressure 136/77, pulse 74, temperature 98.4 F (36.9 C), temperature source Oral, resp. rate 16, height 5\' 6"  (1.676 m), weight 89.812 kg (198 lb), SpO2 100 %.  I/O:    Intake/Output Summary (Last 24 hours) at 07/13/14 1041 Last data filed at 07/13/14 0900  Gross per 24 hour  Intake    720 ml  Output      0 ml  Net    720 ml    PHYSICAL EXAMINATION:   GENERAL: Pleasant-appearing in no apparent distress.  HEAD, EYES, EARS, NOSE AND THROAT: Atraumatic, normocephalic. Extraocular muscles are intact. Pupils equal and reactive to light. Sclerae anicteric. No conjunctival injection. No oro-pharyngeal erythema.  NECK: Supple. There is no jugular venous distention. No bruits, no lymphadenopathy, no thyromegaly.  HEART: Regular rate and rhythm, tachycardic. No murmurs, no rubs, no clicks.  LUNGS: Clear to auscultation bilaterally. No rales or rhonchi. No wheezes.  ABDOMEN: Soft, flat, nontender, nondistended. Has good bowel sounds. No hepatosplenomegaly appreciated.  EXTREMITIES: No evidence of any cyanosis, clubbing, or peripheral edema. +2 pedal and radial pulses bilaterally.  NEUROLOGIC: The patient is alert, awake, and oriented x3 with no focal motor or sensory deficits appreciated bilaterally.  SKIN: Right lower extremity there is patchy area of erythema on her thighs as well as legs. Warm to touch , less area than the markings done on admission. Psych: Not anxious, depressed LN: No inguinal LN enlargement  DATA REVIEW:   CBC  Recent Labs Lab 07/11/14 0444  WBC 19.9*  HGB 8.7*  HCT 27.7*  PLT 201    Chemistries   Recent Labs Lab 07/09/14 1913  07/11/14 0444  NA 140  < > 140  K 3.7  < > 4.0  CL 105  < > 110  CO2 27  < > 25  GLUCOSE 115*  < > 87  BUN 13  < > 10  CREATININE 0.81  < > 0.55  CALCIUM  8.6*  < > 8.1*  AST 43*  --   --   ALT 22  --   --   ALKPHOS 94  --   --   BILITOT 0.3  --   --   < > = values in this interval not displayed.  Cardiac Enzymes No results for input(s): TROPONINI in the last 168 hours.  Microbiology Results  Results for orders placed or performed during the hospital encounter of 07/09/14  Culture, blood (routine x 2)     Status: None (Preliminary result)   Collection Time: 07/09/14  7:02 PM  Result Value Ref Range Status   Specimen Description BLOOD  Final   Special Requests BOTTLES DRAWN AEROBIC AND ANAEROBIC 2MLAER,5MLANA  Final   Culture NO GROWTH 4 DAYS  Final   Report Status PENDING  Incomplete  Culture, blood (routine x 2)     Status: None (Preliminary result)   Collection Time: 07/09/14  7:13 PM  Result Value Ref Range Status   Specimen Description BLOOD  Final   Special Requests [id number]  Final   Culture NO GROWTH 4 DAYS  Final   Report Status PENDING  Incomplete  Urine culture     Status: None   Collection Time: 07/10/14  5:02 AM  Result Value Ref Range Status   Specimen Description URINE, CLEAN CATCH  Final   Special Requests NONE  Final   Culture   Final    MULTIPLE SPECIES PRESENT, SUGGEST RECOLLECTION IF CLINICALLY INDICATED   Report Status 07/11/2014 FINAL  Final    RADIOLOGY:  No results found.  EKG:  No orders found for this or any previous visit.   Management plans discussed with the patient, family ( cousin on phone) and they are in agreement.  CODE STATUS:     Code Status Orders        Start     Ordered   07/10/14 0002  Full code   Continuous     07/10/14 0001    Advance Directive Documentation        Most Recent Value   Type of Advance Directive  Healthcare Power of Attorney   Pre-existing out of facility DNR order (yellow form or pink MOST form)     "MOST" Form in Place?  TOTAL TIME TAKING CARE OF THIS PATIENT: 40 minutes.    Vaughan Basta M.D on 07/13/2014 at 10:41 AM  Between  7am to 6pm - Pager - [phone]  After 6pm go to [email or web address] - password [email or web address]  Office  [phone]  CC: Primary care physician; Juluis Pitch, MD

## 2014-07-12 NOTE — Progress Notes (Signed)
Initially was planning to d/c on xarelto, pharmacist called- about interaction with phenytoin. I will d/c on lovenox + coumadin and need to check INR in 3-4 days.

## 2014-07-12 NOTE — Care Management Note (Signed)
Case Management Note  Patient Details  Name: Laurie Rice MRN: [id number] Date of Birth: 1960-04-25  Subjective/Objective:        Left phone message for Emmit Pomfret at [facility]  per faxed referral for home health services.         Action/Plan:   Expected Discharge Date:                  Expected Discharge Plan:     In-House Referral:     Discharge planning Services     Post Acute Care Choice:    Choice offered to:     DME Arranged:    DME Agency:     HH Arranged:    [place] Agency:     Status of Service:     Medicare Important Message Given:    Date Medicare IM Given:    Medicare IM give by:    Date Additional Medicare IM Given:    Additional Medicare Important Message give by:     If discussed at [place] of Stay Meetings, dates discussed:    Additional Comments:  Tausha Milhoan A, RN 07/12/2014, 2:53 PM

## 2014-07-12 NOTE — Progress Notes (Signed)
DISCHARGE NOTE:   Called report to [facility]. Pts VSS, Pt wheeled to car, handed discharge envelop to [employer].  Kim providing transportation.

## 2014-07-12 NOTE — Progress Notes (Signed)
[place] [facility]                                                                                                                                                                                            Patient Demographics   Laurie Rice, is a 54 y.o. female, DOB - 1960/07/04, FKC:[id number]  Admit date - 07/09/2014   Admitting Physician Lance Coon, MD  Outpatient Primary MD for the patient is Juluis Pitch, MD   LOS - 2  Subjective: She has right lower extremity erythema. She besides the rash on the legs she denies any chest pain or shortness of breath. UA is positive.  Review of Systems:   CONSTITUTIONAL: No documented fever. No fatigue, weakness. No weight gain, no weight loss.  EYES: No blurry or double vision.  ENT: No tinnitus. No postnasal drip. No redness of the oropharynx.  RESPIRATORY: No cough, no wheeze, no hemoptysis. No dyspnea.  CARDIOVASCULAR: No chest pain. No orthopnea. No palpitations. No syncope.  GASTROINTESTINAL: No nausea, no vomiting or diarrhea. No abdominal pain. No melena or hematochezia.  GENITOURINARY: No dysuria or hematuria.  ENDOCRINE: No polyuria or nocturia. No heat or cold intolerance.  HEMATOLOGY: No anemia. No bruising. No bleeding.  INTEGUMENTARY: Rash on the right lower extremity with erythema and warmth  MUSCULOSKELETAL: No arthritis. No swelling. No gout.  NEUROLOGIC: No numbness, tingling, or ataxia. No seizure-type activity.  PSYCHIATRIC: No anxiety. No insomnia. No ADD.    Vitals:   Filed Vitals:   07/11/14 1528 07/11/14 2014 07/12/14 0418 07/12/14 0725  BP: 124/54 136/76 138/72 141/73  Pulse: 89 97 69 72  Temp: 98.4 F (36.9 C) 99.4 F (37.4 C) 98.7 F (37.1 C) 99.3 F (37.4 C)  TempSrc: Oral Oral Oral Oral  Resp: 16 18 18 16   Height:      Weight:      SpO2: 98% 100% 100% 100%    Wt Readings from Last 3 Encounters:  07/09/14 89.812 kg (198 lb)  07/07/14 89.9 kg (198 lb  3.1 oz)     Intake/Output Summary (Last 24 hours) at 07/12/14 1207 Last data filed at 07/12/14 0800  Gross per 24 hour  Intake    240 ml  Output      0 ml  Net    240 ml    Physical Exam:   GENERAL: Pleasant-appearing in no apparent distress.  HEAD, EYES, EARS, NOSE AND THROAT: Atraumatic, normocephalic. Extraocular muscles are intact. Pupils equal and reactive to light. Sclerae anicteric. No conjunctival injection. No oro-pharyngeal erythema.  NECK: Supple. There is no jugular venous distention.  No bruits, no lymphadenopathy, no thyromegaly.  HEART: Regular rate and rhythm, tachycardic. No murmurs, no rubs, no clicks.  LUNGS: Clear to auscultation bilaterally. No rales or rhonchi. No wheezes.  ABDOMEN: Soft, flat, nontender, nondistended. Has good bowel sounds. No hepatosplenomegaly appreciated.  EXTREMITIES: No evidence of any cyanosis, clubbing, or peripheral edema.  +2 pedal and radial pulses bilaterally.  NEUROLOGIC: The patient is alert, awake, and oriented x3 with no focal motor or sensory deficits appreciated bilaterally.  SKIN: Right lower extremity there is patchy area of erythema on her thighs as well as legs. Warm to touch , less area than the markings done on admission. Psych: Not anxious, depressed LN: No inguinal LN enlargement    Antibiotics   Anti-infectives    Start     Dose/Rate Route Frequency Ordered Stop   07/11/14 1430  levofloxacin (LEVAQUIN) tablet 500 mg     500 mg Oral Daily 07/11/14 1424     07/10/14 1215  Ampicillin-Sulbactam (UNASYN) 3 g in sodium chloride 0.9 % 100 mL IVPB     3 g 100 mL/hr over 60 Minutes Intravenous Every 6 hours 07/10/14 1214     07/10/14 0530  vancomycin (VANCOCIN) IVPB 1000 mg/200 mL premix  Status:  Discontinued     1,000 mg 200 mL/hr over 60 Minutes Intravenous Every 12 hours 07/10/14 0018 07/11/14 1426   07/09/14 2215  vancomycin (VANCOCIN) IVPB 1000 mg/200 mL premix     1,000 mg 200 mL/hr over 60 Minutes Intravenous   Once 07/09/14 2205 07/10/14 0030      Medications   Scheduled Meds: . ampicillin-sulbactam (UNASYN) IV  3 g Intravenous Q6H  . enoxaparin  90 mg Subcutaneous Q12H  . levofloxacin  500 mg Oral Daily  . PHENobarbital  32.4 mg Oral BID  . phenytoin  200 mg Oral QHS  . warfarin  2.5 mg Oral ONCE-1800  . warfarin  7.5 mg Oral q1800  . Warfarin - Pharmacist Dosing Inpatient   Does not apply q1800   Continuous Infusions:  PRN Meds:.acetaminophen **OR** acetaminophen, HYDROcodone-acetaminophen   Data Review:   Micro Results Recent Results (from the past 240 hour(s))  Culture, blood (routine x 2)     Status: None (Preliminary result)   Collection Time: 07/09/14  7:02 PM  Result Value Ref Range Status   Specimen Description BLOOD  Final   Special Requests BOTTLES DRAWN AEROBIC AND ANAEROBIC 2MLAER,5MLANA  Final   Culture NO GROWTH 3 DAYS  Final   Report Status PENDING  Incomplete  Culture, blood (routine x 2)     Status: None (Preliminary result)   Collection Time: 07/09/14  7:13 PM  Result Value Ref Range Status   Specimen Description BLOOD  Final   Special Requests [id number]  Final   Culture NO GROWTH 3 DAYS  Final   Report Status PENDING  Incomplete  Urine culture     Status: None   Collection Time: 07/10/14  5:02 AM  Result Value Ref Range Status   Specimen Description URINE, CLEAN CATCH  Final   Special Requests NONE  Final   Culture   Final    MULTIPLE SPECIES PRESENT, SUGGEST RECOLLECTION IF CLINICALLY INDICATED   Report Status 07/11/2014 FINAL  Final    Radiology Reports Dg Chest 2 View  07/09/2014   CLINICAL DATA:  Acute onset of fever and right lower leg rash. Initial encounter.  EXAM: CHEST  2 VIEW  COMPARISON:  Chest radiograph performed 11/28/2008, and CT of the chest performed  11/29/2008  FINDINGS: The lungs are well-aerated and clear. There is no evidence of focal opacification, pleural effusion or pneumothorax.  The heart is normal in size; the mediastinal  contour is within normal limits. No acute osseous abnormalities are seen.  IMPRESSION: No acute cardiopulmonary process seen.   Electronically Signed   By: Garald Balding M.D.   On: 07/09/2014 22:28   US Venous Img Lower Unilateral Right  07/09/2014   CLINICAL DATA:  Erythema and swelling at the right lower extremity for 1 day. Initial encounter.  EXAM: RIGHT LOWER EXTREMITY VENOUS DOPPLER ULTRASOUND  TECHNIQUE: Gray-scale sonography with graded compression, as well as color Doppler and duplex ultrasound were performed to evaluate the lower extremity deep venous systems from the level of the common femoral vein and including the common femoral, femoral, profunda femoral, popliteal and calf veins including the posterior tibial, peroneal and gastrocnemius veins when visible. The superficial great saphenous vein was also interrogated. Spectral Doppler was utilized to evaluate flow at rest and with distal augmentation maneuvers in the common femoral, femoral and popliteal veins.  COMPARISON:  None.  FINDINGS: Contralateral Common Femoral Vein: Respiratory phasicity is normal and symmetric with the symptomatic side. No evidence of thrombus. Normal compressibility.  Common Femoral Vein: No evidence of thrombus. Normal compressibility, respiratory phasicity and response to augmentation.  Saphenofemoral Junction: No evidence of thrombus. Normal compressibility and flow on color Doppler imaging.  Profunda Femoral Vein: No evidence of thrombus. Normal compressibility and flow on color Doppler imaging.  Femoral Vein: No evidence of thrombus. Normal compressibility, respiratory phasicity and response to augmentation.  Popliteal Vein: Occlusive thrombus is noted within the right popliteal vein.  Calf Veins: Occlusive thrombus is noted within the posterior tibial vein. The peroneal vein is not visualized.  Superficial Great Saphenous Vein: No evidence of thrombus. Normal compressibility and flow on color Doppler imaging.   Venous Reflux:  None.  Other Findings:  None.  IMPRESSION: Occlusive deep venous thrombosis noted within the right popliteal vein and posterior tibial vein. Remaining calf veins are not visualized on this study.  Critical Value/emergent results were called by telephone at the time of interpretation on 07/09/2014 at 8:16 pm to [place], who verbally acknowledged these results.   Electronically Signed   By: Garald Balding M.D.   On: 07/09/2014 20:19   Mm Digital Screening Bilateral  07/07/2014   CLINICAL DATA:  Screening.  EXAM: DIGITAL SCREENING BILATERAL MAMMOGRAM WITH CAD  COMPARISON:  Previous exam(s).  ACR Breast Density Category c: The breast tissue is heterogeneously dense, which may obscure small masses.  FINDINGS: There are no findings suspicious for malignancy. Images were processed with CAD.  IMPRESSION: No mammographic evidence of malignancy. A result letter of this screening mammogram will be mailed directly to the patient.  RECOMMENDATION: Screening mammogram in one year. (Code:SM-B-01Y)  BI-RADS CATEGORY  1: Negative.   Electronically Signed   By: Pamelia Hoit M.D.   On: 07/07/2014 18:46     CBC  Recent Labs Lab 07/07/14 1243 07/09/14 1913 07/10/14 0517 07/11/14 0444  WBC 22.8* 32.1* 21.8* 19.9*  HGB 10.0* 9.6* 8.7* 8.7*  HCT 33.4* 31.0* 28.8* 27.7*  PLT 275 190 191 201  MCV 69.5* 69.2* 69.0* 68.6*  MCH 20.7* 21.5* 20.9* 21.5*  MCHC 29.8* 31.0* 30.3* 31.4*  RDW 16.4* 16.2* 16.2* 16.0*  LYMPHSABS 14.6* 13.8*  --   --   MONOABS 0.9 1.6*  --   --   EOSABS 0.2 0.6  --   --  BASOSABS 0.1 0.0  --   --     Chemistries   Recent Labs Lab 07/07/14 1243 07/09/14 1913 07/10/14 0517 07/11/14 0444  NA 135 140 142 140  K 3.9 3.7 3.3* 4.0  CL 102 105 109 110  CO2 27 27 29 25   GLUCOSE 83 115* 97 87  BUN 11 13 10 10   CREATININE 0.69 0.81 0.70 0.55  CALCIUM 8.3* 8.6* 8.1* 8.1*  AST 17 43*  --   --   ALT 13* 22  --   --   ALKPHOS 121 94  --   --   BILITOT 0.2* 0.3  --    --    ------------------------------------------------------------------------------------------------------------------ estimated creatinine clearance is 89.7 mL/min (by C-G formula based on Cr of 0.55). ------------------------------------------------------------------------------------------------------------------ No results for input(s): HGBA1C in the last 72 hours. ------------------------------------------------------------------------------------------------------------------ No results for input(s): CHOL, HDL, LDLCALC, TRIG, CHOLHDL, LDLDIRECT in the last 72 hours. ------------------------------------------------------------------------------------------------------------------ No results for input(s): TSH, T4TOTAL, T3FREE, THYROIDAB in the last 72 hours.  Invalid input(s): FREET3 ------------------------------------------------------------------------------------------------------------------ No results for input(s): VITAMINB12, FOLATE, FERRITIN, TIBC, IRON, RETICCTPCT in the last 72 hours.  Coagulation profile  Recent Labs Lab 07/09/14 1913 07/10/14 0517 07/11/14 0444 07/12/14 0354  INR 1.66 1.67 1.35 1.31    No results for input(s): DDIMER in the last 72 hours.  Cardiac Enzymes No results for input(s): CKMB, TROPONINI, MYOGLOBIN in the last 168 hours.  Invalid input(s): CK ------------------------------------------------------------------------------------------------------------------ Invalid input(s): [place]    1.  DVT (deep venous thrombosis) - likely due to the fact that her INR is subtherapeutic. Continue therapeutic dose Lovenox until INR is therapeutic ,  may swich to newer anticoagulants on d/c. 2.  Cellulitis of leg, right -initially given but stop today-IV vancomycin    Cont unasyn. 3.  Seizures - stable, continue continue phenobarbital 4. Chronic anemia follow hemoglobin- stable. 5. Mild mental retardation 6. UTI-  levaquin,      Code Status Orders        Start     Ordered   07/10/14 0002  Full code   Continuous     07/10/14 0001    Advance Directive Documentation        Most Recent Value   Type of Advance Directive  Healthcare Power of Attorney   Pre-existing out of facility DNR order (yellow form or pink MOST form)     "MOST" Form in Place?        onsults  none   DVT Prophylaxis  Lovenox   Lab Results  Component Value Date   PLT 201 07/11/2014     Time Spent in minutes  45 minutes  Greater than 50% of time spent in care coordination and counseling.   Vaughan Basta M.D on 07/11/14.  Between 7am to 6pm - Pager - [phone] After 6pm go to [email or web address] - password [email or web address]   Office  [phone]

## 2014-07-12 NOTE — Discharge Instructions (Signed)
lovenox injections BID for 4-5 days and check INR in 3 days to readjust coumadin dose.

## 2014-07-12 NOTE — Progress Notes (Signed)
Issues came up while reviewing the pt's discharge.  Worker at the group home said that some of the meds did not match with what the pt was taking prior to coming into the hospital.  Med list was faxed from the group home.  Phenobarbital was being split up here.  MD gave order to allow pt to take as prior to admit.  Pt usually takes a dilantin 50mg  at bedtime to go along with the 200mg  dilantin she takes at bedtime.  MD said to order this.  MD said to discontinue xarelto and order lovenox and order a consult for pharmacy to manage warfarin.  Discharge cancelled

## 2014-07-12 NOTE — Progress Notes (Signed)
ANTICOAGULATION CONSULT NOTE - Follow Up Consult  Pharmacy Consult for Coumadin Indication: DVT  No Known Allergies  Patient Measurements: Height: 5\' 6"  (167.6 cm) Weight: 198 lb (89.812 kg) IBW/kg (Calculated) : 59.3   Vital Signs: Temp: 98.7 F (37.1 C) (07/10 0418) Temp Source: Oral (07/10 0418) BP: 138/72 mmHg (07/10 0418) Pulse Rate: 69 (07/10 0418)  Labs:  Recent Labs  07/09/14 1913 07/10/14 0517 07/11/14 0444 07/12/14 0354  HGB 9.6* 8.7* 8.7*  --   HCT 31.0* 28.8* 27.7*  --   PLT 190 191 201  --   LABPROT 19.8* 19.9* 16.9* 16.5*  INR 1.66 1.67 1.35 1.31  CREATININE 0.81 0.70 0.55  --   CKTOTAL 1193*  --   --   --     Estimated Creatinine Clearance: 89.7 mL/min (by C-G formula based on Cr of 0.55).   Medications:  Scheduled:  . ampicillin-sulbactam (UNASYN) IV  3 g Intravenous Q6H  . enoxaparin  90 mg Subcutaneous Q12H  . levofloxacin  500 mg Oral Daily  . PHENobarbital  32.4 mg Oral BID  . phenytoin  200 mg Oral QHS  . warfarin  2.5 mg Oral ONCE-1800  . warfarin  7.5 mg Oral q1800  . 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 PTA dosing of Coumadin 7 mg daily. INR subtherapeutic likely still reflecting missed dose 7/7.  Goal of Therapy:  INR 2-3   Plan:  Will give Coumadin 2.5 mg today in addition to 7.5 mg for a total of 10 mg and f/u AM INR.   Ulice Dash D 07/12/2014,5:20 AM

## 2014-07-12 NOTE — Care Management Note (Signed)
Case Management Note  Patient Details  Name: Laurie Rice MRN: [id number] Date of Birth: [birth year]  Subjective/Objective:            Discharging MD will need to write an order for Home Health PT, RN, INR.  Home Health nurse will obtain INR as ordered by MD and teach family care home manager to administer Lovenox SQ injections as ordered by MD. Discharge referral information will be faxed to [facility] as soon as it is entered by physician.  .         Action/Plan:   Expected Discharge Date:                  Expected Discharge Plan:     In-House Referral:     Discharge planning Services     Post Acute Care Choice:    Choice offered to:     DME Arranged:    DME Agency:     HH Arranged:    [place] Agency:     Status of Service:     Medicare Important Message Given:    Date Medicare IM Given:    Medicare IM give by:    Date Additional Medicare IM Given:    Additional Medicare Important Message give by:     If discussed at [place] of Stay Meetings, dates discussed:    Additional Comments:  Meggin Ola A, RN 07/12/2014, 12:02 PM

## 2014-07-13 ENCOUNTER — Encounter: Payer: Self-pay | Admitting: Hematology and Oncology

## 2014-07-13 LAB — PROTIME-INR
INR: 1.52
Prothrombin Time: 18.5 seconds — ABNORMAL HIGH (ref 11.4–15.0)

## 2014-07-13 LAB — PHENYTOIN LEVEL, TOTAL: Phenytoin Lvl: 7.7 ug/mL — ABNORMAL LOW (ref 10.0–20.0)

## 2014-07-13 NOTE — Care Management (Signed)
Important Message  Patient Details  Name: Laurie Rice MRN: [id number] Date of Birth: [birth year]   Medicare Important Message Given:  Yes-second notification given    [name] 07/13/2014, 11:43 AM

## 2014-07-13 NOTE — Progress Notes (Signed)
Pts Dilantin level 7.7. Dr. Anselm Jungling notified, per Dr. Anselm Jungling she will go back home on the 250 mg that she was on before being admitted. Will continue to monitor.

## 2014-07-13 NOTE — Progress Notes (Signed)
[place] [facility]                                                                                                                                                                                            Patient Demographics   Laurie Rice, is a 54 y.o. female, DOB - [birth year], JIR:[id number]  Admit date - 07/09/2014   Admitting Physician Lance Coon, MD  Outpatient Primary MD for the patient is Juluis Pitch, MD   LOS - 3  Subjective: She has right lower extremity erythema. She besides the rash on the legs she denies any chest pain or shortness of breath. UA is positive. Redness is much better today.  Review of Systems:   CONSTITUTIONAL: No documented fever. No fatigue, weakness. No weight gain, no weight loss.  EYES: No blurry or double vision.  ENT: No tinnitus. No postnasal drip. No redness of the oropharynx.  RESPIRATORY: No cough, no wheeze, no hemoptysis. No dyspnea.  CARDIOVASCULAR: No chest pain. No orthopnea. No palpitations. No syncope.  GASTROINTESTINAL: No nausea, no vomiting or diarrhea. No abdominal pain. No melena or hematochezia.  GENITOURINARY: No dysuria or hematuria.  ENDOCRINE: No polyuria or nocturia. No heat or cold intolerance.  HEMATOLOGY: No anemia. No bruising. No bleeding.  INTEGUMENTARY: Rash on the right lower extremity with erythema and warmth  MUSCULOSKELETAL: No arthritis. No swelling. No gout.  NEUROLOGIC: No numbness, tingling, or ataxia. No seizure-type activity.  PSYCHIATRIC: No anxiety. No insomnia. No ADD.    Vitals:   Filed Vitals:   07/12/14 1559 07/12/14 2003 07/13/14 0444 07/13/14 0730  BP: 122/71 126/67 128/71 136/77  Pulse: 87 93 72 74  Temp: 99.1 F (37.3 C) 98.9 F (37.2 C) 98.5 F (36.9 C) 98.4 F (36.9 C)  TempSrc: Oral Oral Oral Oral  Resp: 18 18 19 16   Height:      Weight:      SpO2: 100% 97% 100% 100%    Wt Readings from Last 3 Encounters:  07/09/14 89.812 kg (198  lb)  07/07/14 89.9 kg (198 lb 3.1 oz)     Intake/Output Summary (Last 24 hours) at 07/13/14 1025 Last data filed at 07/13/14 0900  Gross per 24 hour  Intake    720 ml  Output      0 ml  Net    720 ml    Physical Exam:   GENERAL: Pleasant-appearing in no apparent distress.  HEAD, EYES, EARS, NOSE AND THROAT: Atraumatic, normocephalic. Extraocular muscles are intact. Pupils equal and reactive to light. Sclerae anicteric. No conjunctival injection. No oro-pharyngeal erythema.  NECK: Supple. There  is no jugular venous distention. No bruits, no lymphadenopathy, no thyromegaly.  HEART: Regular rate and rhythm, tachycardic. No murmurs, no rubs, no clicks.  LUNGS: Clear to auscultation bilaterally. No rales or rhonchi. No wheezes.  ABDOMEN: Soft, flat, nontender, nondistended. Has good bowel sounds. No hepatosplenomegaly appreciated.  EXTREMITIES: No evidence of any cyanosis, clubbing, or peripheral edema.  +2 pedal and radial pulses bilaterally.  NEUROLOGIC: The patient is alert, awake, and oriented x3 with no focal motor or sensory deficits appreciated bilaterally.  SKIN: Right lower extremity there is patchy area of erythema on her thighs as well as legs. NOT Warm to touch , very less area than the markings done on admission. Psych: Not anxious, depressed LN: No inguinal LN enlargement    Antibiotics   Anti-infectives    Start     Dose/Rate Route Frequency Ordered Stop   07/12/14 0000  levofloxacin (LEVAQUIN) 500 MG tablet     500 mg Oral Daily 07/12/14 1216     07/12/14 0000  amoxicillin-clavulanate (AUGMENTIN) 875-125 MG per tablet     1 tablet Oral 2 times daily 07/12/14 1216     07/11/14 1430  levofloxacin (LEVAQUIN) tablet 500 mg     500 mg Oral Daily 07/11/14 1424     07/10/14 1215  Ampicillin-Sulbactam (UNASYN) 3 g in sodium chloride 0.9 % 100 mL IVPB     3 g 100 mL/hr over 60 Minutes Intravenous Every 6 hours 07/10/14 1214     07/10/14 0530  vancomycin (VANCOCIN) IVPB 1000  mg/200 mL premix  Status:  Discontinued     1,000 mg 200 mL/hr over 60 Minutes Intravenous Every 12 hours 07/10/14 0018 07/11/14 1426   07/09/14 2215  vancomycin (VANCOCIN) IVPB 1000 mg/200 mL premix     1,000 mg 200 mL/hr over 60 Minutes Intravenous  Once 07/09/14 2205 07/10/14 0030      Medications   Scheduled Meds: . ampicillin-sulbactam (UNASYN) IV  3 g Intravenous Q6H  . enoxaparin (LOVENOX) injection  1 mg/kg Subcutaneous BID  . levofloxacin  500 mg Oral Daily  . phenobarbital  64.8 mg Oral QHS  . phenytoin  50 mg Oral QHS  . phenytoin  200 mg Oral QHS  . warfarin  7.5 mg Oral q1800  . Warfarin - Pharmacist Dosing Inpatient   Does not apply q1800   Continuous Infusions:  PRN Meds:.acetaminophen **OR** acetaminophen, HYDROcodone-acetaminophen   Data Review:   Micro Results Recent Results (from the past 240 hour(s))  Culture, blood (routine x 2)     Status: None (Preliminary result)   Collection Time: 07/09/14  7:02 PM  Result Value Ref Range Status   Specimen Description BLOOD  Final   Special Requests BOTTLES DRAWN AEROBIC AND ANAEROBIC 2MLAER,5MLANA  Final   Culture NO GROWTH 4 DAYS  Final   Report Status PENDING  Incomplete  Culture, blood (routine x 2)     Status: None (Preliminary result)   Collection Time: 07/09/14  7:13 PM  Result Value Ref Range Status   Specimen Description BLOOD  Final   Special Requests [id number]  Final   Culture NO GROWTH 4 DAYS  Final   Report Status PENDING  Incomplete  Urine culture     Status: None   Collection Time: 07/10/14  5:02 AM  Result Value Ref Range Status   Specimen Description URINE, CLEAN CATCH  Final   Special Requests NONE  Final   Culture   Final    MULTIPLE SPECIES PRESENT, SUGGEST RECOLLECTION IF  CLINICALLY INDICATED   Report Status 07/11/2014 FINAL  Final    Radiology Reports Dg Chest 2 View  07/09/2014   CLINICAL DATA:  Acute onset of fever and right lower leg rash. Initial encounter.  EXAM: CHEST  2 VIEW   COMPARISON:  Chest radiograph performed 11/28/2008, and CT of the chest performed 11/29/2008  FINDINGS: The lungs are well-aerated and clear. There is no evidence of focal opacification, pleural effusion or pneumothorax.  The heart is normal in size; the mediastinal contour is within normal limits. No acute osseous abnormalities are seen.  IMPRESSION: No acute cardiopulmonary process seen.   Electronically Signed   By: Garald Balding M.D.   On: 07/09/2014 22:28   US Venous Img Lower Unilateral Right  07/09/2014   CLINICAL DATA:  Erythema and swelling at the right lower extremity for 1 day. Initial encounter.  EXAM: RIGHT LOWER EXTREMITY VENOUS DOPPLER ULTRASOUND  TECHNIQUE: Gray-scale sonography with graded compression, as well as color Doppler and duplex ultrasound were performed to evaluate the lower extremity deep venous systems from the level of the common femoral vein and including the common femoral, femoral, profunda femoral, popliteal and calf veins including the posterior tibial, peroneal and gastrocnemius veins when visible. The superficial great saphenous vein was also interrogated. Spectral Doppler was utilized to evaluate flow at rest and with distal augmentation maneuvers in the common femoral, femoral and popliteal veins.  COMPARISON:  None.  FINDINGS: Contralateral Common Femoral Vein: Respiratory phasicity is normal and symmetric with the symptomatic side. No evidence of thrombus. Normal compressibility.  Common Femoral Vein: No evidence of thrombus. Normal compressibility, respiratory phasicity and response to augmentation.  Saphenofemoral Junction: No evidence of thrombus. Normal compressibility and flow on color Doppler imaging.  Profunda Femoral Vein: No evidence of thrombus. Normal compressibility and flow on color Doppler imaging.  Femoral Vein: No evidence of thrombus. Normal compressibility, respiratory phasicity and response to augmentation.  Popliteal Vein: Occlusive thrombus is noted  within the right popliteal vein.  Calf Veins: Occlusive thrombus is noted within the posterior tibial vein. The peroneal vein is not visualized.  Superficial Great Saphenous Vein: No evidence of thrombus. Normal compressibility and flow on color Doppler imaging.  Venous Reflux:  None.  Other Findings:  None.  IMPRESSION: Occlusive deep venous thrombosis noted within the right popliteal vein and posterior tibial vein. Remaining calf veins are not visualized on this study.  Critical Value/emergent results were called by telephone at the time of interpretation on 07/09/2014 at 8:16 pm to [place], who verbally acknowledged these results.   Electronically Signed   By: Garald Balding M.D.   On: 07/09/2014 20:19   Mm Digital Screening Bilateral  07/07/2014   CLINICAL DATA:  Screening.  EXAM: DIGITAL SCREENING BILATERAL MAMMOGRAM WITH CAD  COMPARISON:  Previous exam(s).  ACR Breast Density Category c: The breast tissue is heterogeneously dense, which may obscure small masses.  FINDINGS: There are no findings suspicious for malignancy. Images were processed with CAD.  IMPRESSION: No mammographic evidence of malignancy. A result letter of this screening mammogram will be mailed directly to the patient.  RECOMMENDATION: Screening mammogram in one year. (Code:SM-B-01Y)  BI-RADS CATEGORY  1: Negative.   Electronically Signed   By: Pamelia Hoit M.D.   On: 07/07/2014 18:46     CBC  Recent Labs Lab 07/07/14 1243 07/09/14 1913 07/10/14 0517 07/11/14 0444  WBC 22.8* 32.1* 21.8* 19.9*  HGB 10.0* 9.6* 8.7* 8.7*  HCT 33.4* 31.0* 28.8* 27.7*  PLT 275 190 191 201  MCV 69.5* 69.2* 69.0* 68.6*  MCH 20.7* 21.5* 20.9* 21.5*  MCHC 29.8* 31.0* 30.3* 31.4*  RDW 16.4* 16.2* 16.2* 16.0*  LYMPHSABS 14.6* 13.8*  --   --   MONOABS 0.9 1.6*  --   --   EOSABS 0.2 0.6  --   --   BASOSABS 0.1 0.0  --   --     Chemistries   Recent Labs Lab 07/07/14 1243 07/09/14 1913 07/10/14 0517 07/11/14 0444  NA 135 140 142 140   K 3.9 3.7 3.3* 4.0  CL 102 105 109 110  CO2 27 27 29 25   GLUCOSE 83 115* 97 87  BUN 11 13 10 10   CREATININE 0.69 0.81 0.70 0.55  CALCIUM 8.3* 8.6* 8.1* 8.1*  AST 17 43*  --   --   ALT 13* 22  --   --   ALKPHOS 121 94  --   --   BILITOT 0.2* 0.3  --   --    ------------------------------------------------------------------------------------------------------------------ estimated creatinine clearance is 89.7 mL/min (by C-G formula based on Cr of 0.55). ------------------------------------------------------------------------------------------------------------------ No results for input(s): HGBA1C in the last 72 hours. ------------------------------------------------------------------------------------------------------------------ No results for input(s): CHOL, HDL, LDLCALC, TRIG, CHOLHDL, LDLDIRECT in the last 72 hours. ------------------------------------------------------------------------------------------------------------------ No results for input(s): TSH, T4TOTAL, T3FREE, THYROIDAB in the last 72 hours.  Invalid input(s): FREET3 ------------------------------------------------------------------------------------------------------------------ No results for input(s): VITAMINB12, FOLATE, FERRITIN, TIBC, IRON, RETICCTPCT in the last 72 hours.  Coagulation profile  Recent Labs Lab 07/09/14 1913 07/10/14 0517 07/11/14 0444 07/12/14 0354 07/13/14 0434  INR 1.66 1.67 1.35 1.31 1.52    No results for input(s): DDIMER in the last 72 hours.  Cardiac Enzymes No results for input(s): CKMB, TROPONINI, MYOGLOBIN in the last 168 hours.  Invalid input(s): CK ------------------------------------------------------------------------------------------------------------------ Invalid input(s): [place]    1.  DVT (deep venous thrombosis) - likely due to the fact that her INR is subtherapeutic. Continue therapeutic dose Lovenox until INR is therapeutic ,    Could not swich to xarelto due to interaction with phenytoin.   Case manager helped to arrange for her to get inj lovenox and visiting nurse to check her INR level. 2.  Cellulitis of leg, right -initially given but stop today-IV vancomycin    Cont unasyn. 3.  Seizures - stable, continue continue phenobarbital and phenytoin.   Phenytoin dose is now corrected to her pre admission dose. 4. Chronic anemia follow hemoglobin- stable. 5. Mild mental retardation 6. UTI- levaquin,      Code Status Orders        Start     Ordered   07/10/14 0002  Full code   Continuous     07/10/14 0001    Advance Directive Documentation        Most Recent Value   Type of Advance Directive  Healthcare Power of Attorney   Pre-existing out of facility DNR order (yellow form or pink MOST form)     "MOST" Form in Place?        onsults  none   DVT Prophylaxis  Lovenox   Lab Results  Component Value Date   PLT 201 07/11/2014     Time Spent in minutes  45 minutes  Greater than 50% of time spent in care coordination and counseling.   Vaughan Basta M.D on 07/11/14.  Between 7am to 6pm - Pager - [phone] After 6pm go to [email or web address] - [facility]   Office  [phone]

## 2014-07-13 NOTE — Progress Notes (Signed)
Pt plan for discharge back to [place] today. Makes all needs known, no concerns at this time. Reports feeling better.

## 2014-07-13 NOTE — Care Management (Signed)
Home health previously arranged with [place] per Ms. Alevardo's request. Lovenox teaching by [facility] needed along with PT/INR labs by [facility]. I have notified [place] with [place] of patient discharge today. No further RNCM needs.

## 2014-07-13 NOTE — Progress Notes (Signed)
ANTICOAGULATION CONSULT NOTE - Follow Up Consult  Pharmacy Consult for Coumadin Indication: DVT  No Known Allergies  Patient Measurements: Height: 5\' 6"  (167.6 cm) Weight: 198 lb (89.812 kg) IBW/kg (Calculated) : 59.3   Vital Signs: Temp: 98.4 F (36.9 C) (07/11 0730) Temp Source: Oral (07/11 0730) BP: 136/77 mmHg (07/11 0730) Pulse Rate: 74 (07/11 0730)  Labs:  Recent Labs  07/11/14 0444 07/12/14 0354 07/13/14 0434  HGB 8.7*  --   --   HCT 27.7*  --   --   PLT 201  --   --   LABPROT 16.9* 16.5* 18.5*  INR 1.35 1.31 1.52  CREATININE 0.55  --   --     Estimated Creatinine Clearance: 89.7 mL/min (by C-G formula based on Cr of 0.55).   Medications:  Scheduled:  . ampicillin-sulbactam (UNASYN) IV  3 g Intravenous Q6H  . enoxaparin (LOVENOX) injection  1 mg/kg Subcutaneous BID  . levofloxacin  500 mg Oral Daily  . phenobarbital  64.8 mg Oral QHS  . phenytoin  50 mg Oral QHS  . phenytoin  200 mg Oral QHS  . warfarin  7.5 mg Oral q1800  . 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 PTA dosing of Coumadin 7 mg daily. INR subtherapeutic likely still reflecting missed dose 7/7.  Goal of Therapy:  INR 2-3   Plan:  Will give Coumadin 7.5 mg and f/u AM INR if patient is not discharged today   Rebekkah Powless D Azani Brogdon 07/13/2014,10:37 AM

## 2014-07-14 ENCOUNTER — Ambulatory Visit
Admission: RE | Admit: 2014-07-14 | Discharge: 2014-07-14 | Disposition: A | Discharge status: Home / Self Care | Payer: Medicare Other | Source: Ambulatory Visit | Attending: Hematology and Oncology | Admitting: Hematology and Oncology

## 2014-07-14 DIAGNOSIS — G40909 Epilepsy, unspecified, not intractable, without status epilepticus: Secondary | ICD-10-CM | POA: Diagnosis present

## 2014-07-14 DIAGNOSIS — G9389 Other specified disorders of brain: Secondary | ICD-10-CM | POA: Insufficient documentation

## 2014-07-14 DIAGNOSIS — D329 Benign neoplasm of meninges, unspecified: Secondary | ICD-10-CM | POA: Insufficient documentation

## 2014-07-14 LAB — CULTURE, BLOOD (ROUTINE X 2)
CULTURE: NO GROWTH
Culture: NO GROWTH

## 2014-07-14 MED ORDER — GADOBENATE DIMEGLUMINE 529 MG/ML IV SOLN
20.0000 mL | Freq: Once | INTRAVENOUS | Status: AC | PRN
  Administered 2014-07-14: 18 mL via INTRAVENOUS

## 2014-07-18 ENCOUNTER — Emergency Department
Admission: EM | Admit: 2014-07-18 | Discharge: 2014-07-18 | Disposition: A | Discharge status: Home / Self Care | Payer: Medicare Other | Attending: Emergency Medicine | Admitting: Emergency Medicine

## 2014-07-18 ENCOUNTER — Emergency Department: Payer: Medicare Other

## 2014-07-18 ENCOUNTER — Encounter: Payer: Self-pay | Admitting: Emergency Medicine

## 2014-07-18 DIAGNOSIS — R55 Syncope and collapse: Secondary | ICD-10-CM | POA: Diagnosis not present

## 2014-07-18 DIAGNOSIS — Z792 Long term (current) use of antibiotics: Secondary | ICD-10-CM | POA: Insufficient documentation

## 2014-07-18 DIAGNOSIS — Z7901 Long term (current) use of anticoagulants: Secondary | ICD-10-CM | POA: Insufficient documentation

## 2014-07-18 DIAGNOSIS — R4182 Altered mental status, unspecified: Secondary | ICD-10-CM | POA: Diagnosis present

## 2014-07-18 DIAGNOSIS — R7889 Finding of other specified substances, not normally found in blood: Secondary | ICD-10-CM | POA: Diagnosis not present

## 2014-07-18 LAB — COMPREHENSIVE METABOLIC PANEL
ALBUMIN: 3.4 g/dL — AB (ref 3.5–5.0)
ALT: 17 U/L (ref 14–54)
AST: 19 U/L (ref 15–41)
Alkaline Phosphatase: 102 U/L (ref 38–126)
Anion gap: 5 (ref 5–15)
BUN: 9 mg/dL (ref 6–20)
CALCIUM: 8.8 mg/dL — AB (ref 8.9–10.3)
CO2: 28 mmol/L (ref 22–32)
Chloride: 108 mmol/L (ref 101–111)
Creatinine, Ser: 0.67 mg/dL (ref 0.44–1.00)
GFR calc Af Amer: >60 mL/min (ref >60)
GFR calc non Af Amer: >60 mL/min (ref >60)
GLUCOSE: 97 mg/dL (ref 65–99)
Potassium: 4 mmol/L (ref 3.5–5.1)
SODIUM: 141 mmol/L (ref 135–145)
TOTAL PROTEIN: 8 g/dL (ref 6.5–8.1)
Total Bilirubin: 0.2 mg/dL — ABNORMAL LOW (ref 0.3–1.2)

## 2014-07-18 LAB — CBC WITH DIFFERENTIAL/PLATELET
BASOS ABS: 0.2 10*3/uL — AB (ref 0–0.1)
Basophils Relative: 1 %
Eosinophils Absolute: 0.2 10*3/uL (ref 0–0.7)
Eosinophils Relative: 1 %
HCT: 30.7 % — ABNORMAL LOW (ref 35.0–47.0)
Hemoglobin: 9.3 g/dL — ABNORMAL LOW (ref 12.0–16.0)
LYMPHS ABS: 11.5 10*3/uL — AB (ref 1.0–3.6)
MCH: 20.8 pg — AB (ref 26.0–34.0)
MCHC: 30.4 g/dL — ABNORMAL LOW (ref 32.0–36.0)
MCV: 68.4 fL — AB (ref 80.0–100.0)
Monocytes Absolute: 1.1 10*3/uL — ABNORMAL HIGH (ref 0.2–0.9)
Monocytes Relative: 5 %
NEUTROS ABS: 9.4 10*3/uL — AB (ref 1.4–6.5)
PLATELETS: 351 10*3/uL (ref 150–440)
RBC: 4.48 MIL/uL (ref 3.80–5.20)
RDW: 15.9 % — AB (ref 11.5–14.5)
WBC: 22.3 10*3/uL — ABNORMAL HIGH (ref 3.6–11.0)

## 2014-07-18 LAB — URINALYSIS COMPLETE WITH MICROSCOPIC (ARMC ONLY)
Bacteria, UA: NONE SEEN
Bilirubin Urine: NEGATIVE
Glucose, UA: NEGATIVE mg/dL
HGB URINE DIPSTICK: NEGATIVE
KETONES UR: NEGATIVE mg/dL
Leukocytes, UA: NEGATIVE
Nitrite: NEGATIVE
PH: 7 (ref 5.0–8.0)
PROTEIN: NEGATIVE mg/dL
RBC / HPF: NONE SEEN RBC/hpf (ref 0–5)
SPECIFIC GRAVITY, URINE: 1.006 (ref 1.005–1.030)
WBC, UA: NONE SEEN WBC/hpf (ref 0–5)

## 2014-07-18 LAB — PROTIME-INR
INR: 1.87
Prothrombin Time: 21.7 seconds — ABNORMAL HIGH (ref 11.4–15.0)

## 2014-07-18 LAB — GLUCOSE, CAPILLARY: GLUCOSE-CAPILLARY: 88 mg/dL (ref 65–99)

## 2014-07-18 LAB — PHENYTOIN LEVEL, TOTAL: Phenytoin Lvl: 8.3 ug/mL — ABNORMAL LOW (ref 10.0–20.0)

## 2014-07-18 LAB — TROPONIN I: Troponin I: <0.03 ng/mL (ref <0.031)

## 2014-07-18 MED ORDER — SODIUM CHLORIDE 0.9 % IV BOLUS (SEPSIS)
1000.0000 mL | Freq: Once | INTRAVENOUS | Status: AC
  Administered 2014-07-18: 1000 mL via INTRAVENOUS

## 2014-07-18 MED ORDER — PHENYTOIN 50 MG PO CHEW
250.0000 mg | CHEWABLE_TABLET | Freq: Three times a day (TID) | ORAL | Status: DC
  Administered 2014-07-18: 250 mg via ORAL
  Filled 2014-07-18 (×3): qty 5

## 2014-07-18 NOTE — ED Notes (Signed)
Pt assisted up to commode for bowel movement.

## 2014-07-18 NOTE — ED Notes (Signed)
Unable to draw blood cultures, three staff members have attempted. Robin in lab notified and to send phlebotomist.

## 2014-07-18 NOTE — ED Provider Notes (Signed)
[facility] Emergency Department Provider Note  ____________________________________________  Time seen: Approximately 1:23 AM  I have reviewed the triage vital signs and the nursing notes.   HISTORY  Chief Complaint Altered Mental Status    HPI Laurie Rice is a 54 y.o. female who presents to the ED via EMS for altered mental status. Staff at the family care home report that patient was awakened for her Lovenox shot, she stood up, felt dizzy and fell. Patient reports she passed out for a few seconds. Denies injury or pain. Voices no complaints currently.There is no report of seizure activity. Patient did not bite tongue nor experience urinary incontinence.   Past Medical History  Diagnosis Date  . Seizures   . Mild mental retardation   . Meningioma   . 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 Personal history of pulmonary embolism   . History of lymphocytosis   . Monoclonal gammopathy     low level  . Anemia     chronic, mcv chronically low  . Cellulitis     History of  . Impetigo     History of  . Liver masses     History of, Benign  . Thyroid mass     history of    Patient Active Problem List   Diagnosis Date Noted  . Cellulitis 07/10/2014  . DVT (deep venous thrombosis) 07/09/2014  . Cellulitis of leg, right 07/09/2014  . Mild mental retardation 07/09/2014  . Seizures 07/09/2014  . Subtherapeutic international normalized ratio (INR) 07/09/2014  . Meningioma 07/07/2014  . Microcytic anemia 07/07/2014  . 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 No past surgeries      Current Outpatient Rx  Name  Route  Sig  Dispense  Refill  . amoxicillin-clavulanate (AUGMENTIN) 875-125 MG per tablet   Oral   Take 1 tablet by mouth 2 (two) times daily.   10 tablet   0   . Emollient (CETAPHIL) cream      APPLY SMALL AMOUNT TO AFFECTED AREAS ONCE DAILY          . enoxaparin (LOVENOX) 100 MG/ML injection   Subcutaneous   Inject 0.9 mLs (90 mg total) into the skin every 12 (twelve) hours.   10 mL   0   . levofloxacin (LEVAQUIN) 500 MG tablet   Oral   Take 1 tablet (500 mg total) by mouth daily.   3 tablet   0   . PHENobarbital (LUMINAL) 32.4 MG tablet   Oral   Take 64.8 mg by mouth at bedtime.          . phenytoin (DILANTIN) 100 MG ER capsule      TAKE 2 CAPSULES BY MOUTH AT BEDTIME (SEIZURE DISORDER)         . phenytoin (DILANTIN) 50 MG tablet   Oral   Chew 50 mg by mouth at bedtime.         . triamcinolone ointment (KENALOG) 0.1 %   Topical   Apply topically.         . warfarin (COUMADIN) 1 MG tablet      TAKE 2 TABLETS (2MG ) BY MOUTH ONCE DAILY (ALONG W/ 5MG =7MG  TOTAL DOSAGE).         Marland Kitchen warfarin (COUMADIN) 5 MG tablet      TAKE 1 TABLET BY MOUTH EVERY DAY (ALONG W/ 2MG =7MG  TOTAL DOSAGE).  Allergies Review of patient's allergies indicates no known allergies.  Family History  Problem Relation Age of Onset  . Hypertension    . Arthritis      Social History History  Substance Use Topics  . Smoking status: Never Smoker   . Smokeless tobacco: Not on file  . Alcohol Use: No    Review of Systems Constitutional: No fever/chills Eyes: No visual changes. ENT: No sore throat. Cardiovascular: Denies chest pain. Respiratory: Denies shortness of breath. Gastrointestinal: No abdominal pain.  No nausea, no vomiting.  No diarrhea.  No constipation. Genitourinary: Negative for dysuria. Musculoskeletal: Negative for back pain. Skin: Negative for rash. Neurological: Negative for headaches, focal weakness or numbness.  10-point ROS otherwise negative.  ____________________________________________   PHYSICAL EXAM:  VITAL SIGNS: ED Triage Vitals  Enc Vitals Group     BP 07/18/14 0012 162/81 mmHg     Pulse Rate 07/18/14 0012 64     Resp 07/18/14 0012 16     Temp 07/18/14 0012 98.1 F (36.7  C)     Temp Source 07/18/14 0012 Oral     SpO2 07/18/14 0012 100 %     Weight --      Height --      Head Cir --      Peak Flow --      Pain Score 07/18/14 0013 0     Pain Loc --      Pain Edu? --      Excl. in [place]? --     Constitutional: Alert and oriented. Well appearing and in no acute distress. Eyes: Conjunctivae are normal. PERRL. EOMI. Head: Atraumatic. Nose: No congestion/rhinnorhea. Mouth/Throat: Edentulous. Mucous membranes are moist.  Oropharynx non-erythematous. Neck: No stridor. No cervical spine tenderness to palpation. Cardiovascular: Normal rate, regular rhythm. Grossly normal heart sounds.  Good peripheral circulation. Respiratory: Normal respiratory effort.  No retractions. Lungs CTAB. Gastrointestinal: Soft and nontender. No distention. No abdominal bruits. No CVA tenderness. Musculoskeletal: No lower extremity tenderness nor edema.  No joint effusions. Neurologic:  Normal speech and language. No gross focal neurologic deficits are appreciated.   Skin:  Skin is warm, dry and intact. No rash noted. Psychiatric: Mood and affect are normal. Speech and behavior are normal.  ____________________________________________   LABS (all labs ordered are listed, but only abnormal results are displayed)  Labs Reviewed  CBC WITH DIFFERENTIAL/PLATELET - Abnormal; Notable for the following:    WBC 22.3 (*)    Hemoglobin 9.3 (*)    HCT 30.7 (*)    MCV 68.4 (*)    MCH 20.8 (*)    MCHC 30.4 (*)    RDW 15.9 (*)    Neutro Abs 9.4 (*)    Lymphs Abs 11.5 (*)    Monocytes Absolute 1.1 (*)    Basophils Absolute 0.2 (*)    All other components within normal limits  COMPREHENSIVE METABOLIC PANEL - Abnormal; Notable for the following:    Calcium 8.8 (*)    Albumin 3.4 (*)    Total Bilirubin 0.2 (*)    All other components within normal limits  URINALYSIS COMPLETEWITH MICROSCOPIC ([facility] ONLY) - Abnormal; Notable for the following:    Color, Urine STRAW (*)    APPearance  CLEAR (*)    Squamous Epithelial / LPF 0-5 (*)    All other components within normal limits  PROTIME-INR - Abnormal; Notable for the following:    Prothrombin Time 21.7 (*)    All other components within normal limits  PHENYTOIN LEVEL, TOTAL - Abnormal; Notable for the following:    Phenytoin Lvl 8.3 (*)    All other components within normal limits  TROPONIN I  GLUCOSE, CAPILLARY  CBG MONITORING, ED   ____________________________________________  EKG  ED ECG REPORT I, Tedra Coppernoll J, the attending physician, personally viewed and interpreted this ECG.   Date: 07/18/2014  EKG Time: 0004  Rate: 67  Rhythm: normal EKG, normal sinus rhythm  Axis: Normal  Intervals:none  ST&T Change: Nonspecific  ____________________________________________  RADIOLOGY  CT head without contrast interpreted per Dr. Gerilyn Nestle: No acute intracranial abnormalities.  Portable chest x-ray (viewed by me, interpreted per Dr. Gerilyn Nestle): No active disease. ____________________________________________   PROCEDURES  Procedure(s) performed: None  Critical Care performed: No  ____________________________________________   INITIAL IMPRESSION / ASSESSMENT AND PLAN / ED COURSE  Pertinent labs & imaging results that were available during my care of the patient were reviewed by me and considered in my medical decision making (see chart for details).  54 year old female who was awakened for her Lovenox shot who reportedly had a syncopal episode upon standing. Patient currently resting in no acute distress and voicing no complaints. Will obtain screening lab work including Dilantin level, urinalysis, CT head and reassess.  ----------------------------------------- 5:03 AM on 07/18/2014 -----------------------------------------  Patient is resting in no acute distress. Voices no complaints of chest pain or shortness of breath. Laboratory values notable for leukocytosis; review of chart demonstrates patient  was hospitalized earlier this month for leg cellulitis and DVT. Her white count was maximally 32 earlier in the month. Patient is afebrile and exhibits no active signs of cellulitis in her right leg. Patient voices no chest pain nor shortness of breath and she is 100% on room air. Patient is on Coumadin in addition to Lovenox therapy for DVT. Low suspicion for pulmonary embolus. Dilantin level noted to be subtherapeutic. Will load patient tonight. Urine improved from prior. Given leukocytosis, will obtain blood and urine cultures. Family is by bedside. I have updated patient and family of labs and imaging results. They are all comfortable with patient returning to her care facility to follow-up with her PCP. Strict return precautions given. All verbalize understanding and agree with plan of care. ____________________________________________   FINAL CLINICAL IMPRESSION(S) / ED DIAGNOSES  Final diagnoses:  Syncope, unspecified syncope type  Subtherapeutic serum dilantin level      Paulette Blanch, MD 07/19/14 [phone]

## 2014-07-18 NOTE — ED Notes (Signed)
Pt provided with po fluids, assisted up to restroom. Pt awaiting staff from care home for discharge. Pt denies needs at this time. Call bell at side.

## 2014-07-18 NOTE — ED Notes (Signed)
Spoke with care home staff, staff to pick pt up, will take "approximately an hour or more." care home is reachable at [phone]

## 2014-07-18 NOTE — Discharge Instructions (Signed)
1. Your Dilantin level was low tonight and we gave you a dose in the emergency department. 2. Blood and urine cultures are pending. You will be notified of any positive results. 3. Return to the ER for recurrent or worsening symptoms, persistent vomiting, fever, difficulty breathing or other concerns.  Syncope Syncope is a medical term for fainting or passing out. This means you lose consciousness and drop to the ground. People are generally unconscious for less than 5 minutes. You may have some muscle twitches for up to 15 seconds before waking up and returning to normal. Syncope occurs more often in older adults, but it can happen to anyone. While most causes of syncope are not dangerous, syncope can be a sign of a serious medical problem. It is important to seek medical care.  CAUSES  Syncope is caused by a sudden drop in blood flow to the brain. The specific cause is often not determined. Factors that can bring on syncope include:  Taking medicines that lower blood pressure.  Sudden changes in posture, such as standing up quickly.  Taking more medicine than prescribed.  Standing in one place for too long.  Seizure disorders.  Dehydration and excessive exposure to heat.  Low blood sugar (hypoglycemia).  Straining to have a bowel movement.  Heart disease, irregular heartbeat, or other circulatory problems.  Fear, emotional distress, seeing blood, or severe pain. SYMPTOMS  Right before fainting, you may:  Feel dizzy or light-headed.  Feel nauseous.  See all white or all black in your field of vision.  Have cold, clammy skin. DIAGNOSIS  Your health care provider will ask about your symptoms, perform a physical exam, and perform an electrocardiogram (ECG) to record the electrical activity of your heart. Your health care provider may also perform other heart or blood tests to determine the cause of your syncope which may include:  Transthoracic echocardiogram (TTE). During  echocardiography, sound waves are used to evaluate how blood flows through your heart.  Transesophageal echocardiogram (TEE).  Cardiac monitoring. This allows your health care provider to monitor your heart rate and rhythm in real time.  Holter monitor. This is a portable device that records your heartbeat and can help diagnose heart arrhythmias. It allows your health care provider to track your heart activity for several days, if needed.  Stress tests by exercise or by giving medicine that makes the heart beat faster. TREATMENT  In most cases, no treatment is needed. Depending on the cause of your syncope, your health care provider may recommend changing or stopping some of your medicines. HOME CARE INSTRUCTIONS  Have someone stay with you until you feel stable.  Do not drive, use machinery, or play sports until your health care provider says it is okay.  Keep all follow-up appointments as directed by your health care provider.  Lie down right away if you start feeling like you might faint. Breathe deeply and steadily. Wait until all the symptoms have passed.  Drink enough fluids to keep your urine clear or pale yellow.  If you are taking blood pressure or heart medicine, get up slowly and take several minutes to sit and then stand. This can reduce dizziness. SEEK IMMEDIATE MEDICAL CARE IF:   You have a severe headache.  You have unusual pain in the chest, abdomen, or back.  You are bleeding from your mouth or rectum, or you have black or tarry stool.  You have an irregular or very fast heartbeat.  You have pain with breathing.  You have repeated fainting or seizure-like jerking during an episode.  You faint when sitting or lying down.  You have confusion.  You have trouble walking.  You have severe weakness.  You have vision problems. If you fainted, call your local emergency services (911 in [place] Do not drive yourself to the hospital.  MAKE SURE YOU:  Understand  these instructions.  Will watch your condition.  Will get help right away if you are not doing well or get worse. Document Released: 12/19/2004 Document Revised: 12/24/2012 Document Reviewed: 02/17/2011 [facility] Patient Information 2015 [place], [place]. This information is not intended to replace advice given to you by your health care provider. Make sure you discuss any questions you have with your health care provider.

## 2014-07-18 NOTE — ED Notes (Addendum)
Pt presents to ED via [facility] with c/o altered mental status. Staff at [facility] home care report that pt was being Lovenox IM and pt started feeling dizzy and fell. Pt reports she passed for few seconds. Denies pain. [place] base at arrival to ED per EMS. Per EMS, CBG-87, BP-150/82, O2-98% on room air. Hx of Siezure.

## 2014-07-20 LAB — URINE CULTURE
CULTURE: NO GROWTH
SPECIAL REQUESTS: NORMAL

## 2014-07-24 LAB — CULTURE, BLOOD (ROUTINE X 2)
Culture: NO GROWTH
Special Requests: NORMAL

## 2014-07-28 ENCOUNTER — Inpatient Hospital Stay (HOSPITAL_BASED_OUTPATIENT_CLINIC_OR_DEPARTMENT_OTHER): Payer: Medicare Other | Admitting: Hematology and Oncology

## 2014-07-28 VITALS — BP 141/79 | HR 65 | Temp 95.4°F | Ht 66.0 in | Wt 194.4 lb

## 2014-07-28 DIAGNOSIS — D509 Iron deficiency anemia, unspecified: Secondary | ICD-10-CM | POA: Diagnosis not present

## 2014-07-28 DIAGNOSIS — D472 Monoclonal gammopathy: Secondary | ICD-10-CM | POA: Diagnosis not present

## 2014-07-28 DIAGNOSIS — C8510 Unspecified B-cell lymphoma, unspecified site: Secondary | ICD-10-CM

## 2014-07-28 DIAGNOSIS — Z86718 Personal history of other venous thrombosis and embolism: Secondary | ICD-10-CM

## 2014-07-28 DIAGNOSIS — Z86711 Personal history of pulmonary embolism: Secondary | ICD-10-CM

## 2014-07-28 DIAGNOSIS — Z7901 Long term (current) use of anticoagulants: Secondary | ICD-10-CM

## 2014-07-28 DIAGNOSIS — I82401 Acute embolism and thrombosis of unspecified deep veins of right lower extremity: Secondary | ICD-10-CM

## 2014-07-28 DIAGNOSIS — Z79899 Other long term (current) drug therapy: Secondary | ICD-10-CM | POA: Diagnosis not present

## 2014-07-28 DIAGNOSIS — D329 Benign neoplasm of meninges, unspecified: Secondary | ICD-10-CM | POA: Diagnosis not present

## 2014-07-28 NOTE — Progress Notes (Signed)
[facility] day:  07/28/2014  Chief Complaint: Laurie Rice is a 54 y.o. female with a low grade B cell lymphoma, microcytic anemia, and meningioma who is seen for review of interval studies.  HPI: The patient was last seen in the medical oncology clinic on 07/07/2014.  At that time,  she was seen for initial assessment by me.  She had a history of lymphocytosis and neutrophilia dating back to at least 09/2006. Flow cytometry on 11/2008 revealed a low-grade B-cell lymphoma (CD20 positive, CD5 negative).  In association, she has had an IgM gammopathy. Monoclonal protein was 300 mg/dL in 11/2008 and then later 1.3 g/dL.  She had a chronic microcytic anemia since 2008. Hemoglobin has ranged between 10 and 11.  She has had negative stool guaiacs. B12 was normal. Iron saturation was 19%. Hemoglobin electrophoresis revealed a normal hemoglobin F and A2 thus ruling out beta thalassemia. Colonoscopy in 05/2011 was normal.    She had a seizure disorder and a meningioma. Meningioma was 5 mm on 11/2008. She has not had follow-up.  She had a history of bilateral DVT and pulmonary embolism in 2010. She was on Coumadin maintenance (managed by her PCP).   Labs at last visit included a hematocrit of 33.4, hemoglobin 10.0, platelets 275,000, WBC 22,800 with an ANC of 14,600. Absolute lymphocyte count (ALC) was 14,600.  IgM was 2420 (high) with an IgG 1129 (normal).  SPEP with monoclonal spike in the beta region of 1.6 gm/dL.  LDH was 133.  Uric acid 5.3.  Retic was 1.2% (low for level of anemia).  ESR was 20.  Ferritin was 34.  She was scheduled for a head MRI.  Head MRI on 07/14/2014 revealed small chronic subfrontal meningioma minimally increased in size from 5 mm to 8 mm since 2010.  There was asymmetric appearance of the mesial right temporal lobe including the right hippocampal formation.  During the interim, she presented to [facility] on  07/09/2014 with right lower extremity erythema and pain. INR was sub-therapeutic.  WBC was elevated at 30,000. Lower extremity duplex revealed a DVT in the right popliteal vein and smaller calf veins. She was also felt to have a superimposed cellulitis. She was treated with vancomycin. She was discharged on 07/13/2014 on Coumadin 7 mg a day and Lovenox 90 mg every 12 hours.   She was not a candidate for Xarelto given the interaction with Dilantin. She was discharged on 07/13/2014.  The patient states that she was discharged on Augmentin (stopped 07/18/2014) and Levaquin (stopped 07/16/2014).  She notes that the nurse comes out twice a week. She is feeling better. She denies any bruising or bleeding.  Past Medical History  Diagnosis Date  . Seizures   . Mild mental retardation   . Meningioma   . 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 Personal history of pulmonary embolism   . History of lymphocytosis   . Monoclonal gammopathy     low level  . Anemia     chronic, mcv chronically low  . Cellulitis     History of  . Impetigo     History of  . Liver masses     History of, Benign  . Thyroid mass     history of    Past Surgical History  Procedure Laterality Date  . No past surgeries      Family History  Problem Relation Age  of Onset  . Hypertension    . Arthritis      Social History:  reports that she has never smoked. She does not have any smokeless tobacco history on file. She reports that she does not drink alcohol or use illicit drugs.  The patient lives at [facility].  She is accompanied by Joelene Millin.  She spends her days watching TV.  She can dress on her own.  She needs help with showers.  Allergies: No Known Allergies  Current Medications: Current Outpatient Prescriptions  Medication Sig Dispense Refill  . amoxicillin-clavulanate (AUGMENTIN) 875-125 MG per tablet Take 1 tablet by mouth 2 (two) times daily. 10 tablet 0  . Emollient  (CETAPHIL) cream APPLY SMALL AMOUNT TO AFFECTED AREAS ONCE DAILY    . enoxaparin (LOVENOX) 100 MG/ML injection Inject 0.9 mLs (90 mg total) into the skin every 12 (twelve) hours. 10 mL 0  . levofloxacin (LEVAQUIN) 500 MG tablet Take 1 tablet (500 mg total) by mouth daily. 3 tablet 0  . PHENobarbital (LUMINAL) 32.4 MG tablet Take 64.8 mg by mouth at bedtime.     . phenytoin (DILANTIN) 100 MG ER capsule TAKE 2 CAPSULES BY MOUTH AT BEDTIME (SEIZURE DISORDER)    . phenytoin (DILANTIN) 50 MG tablet Chew 50 mg by mouth at bedtime.    . triamcinolone ointment (KENALOG) 0.1 % Apply topically.    . warfarin (COUMADIN) 1 MG tablet TAKE 2 TABLETS (2MG) BY MOUTH ONCE DAILY (ALONG W/ 5MG=7MG TOTAL DOSAGE).    Marland Kitchen warfarin (COUMADIN) 5 MG tablet TAKE 1 TABLET BY MOUTH EVERY DAY (ALONG W/ 2MG=7MG TOTAL DOSAGE).     No current facility-administered medications for this visit.    Review of Systems:  GENERAL:  Feels "ok".  No fevers or sweats.  Weight down 3 pounds. PERFORMANCE STATUS (ECOG):  3 HEENT:  No visual changes, runny nose, sore throat, mouth sores or tenderness. Lungs: No shortness of breath or cough.  No hemoptysis. Cardiac:  No chest pain, palpitations, orthopnea, or PND. GI:  No nausea, vomiting, diarrhea, constipation, melena or hematochezia. GU:  No urgency, frequency, dysuria, or hematuria. Musculoskeletal:  No back pain.  No joint pain.  No muscle tenderness. Extremities:  Recent DVT and cellulitis in right leg.  No current pain or swelling. Skin:  No rashes or skin changes. Neuro:  No seizures.  No headache, numbness or weakness, balance or coordination issues. Endocrine:  No diabetes, thyroid issues, hot flashes or night sweats. Psych:  No mood changes, depression or anxiety. Pain:  No focal pain. Review of systems:  All other systems reviewed and found to be negative.   Physical Exam: Blood pressure 141/79, pulse 65, temperature 95.4 F (35.2 C), temperature source Tympanic, height  '5\' 6"'  (1.676 m), weight 194 lb 7.1 oz (88.2 kg). GENERAL:  Well developed, well nourished, sitting comfortably in the exam room in no acute distress.  Rolling walker at her side.   MENTAL STATUS:  Alert and oriented to person, place and time. HEAD:  Flat facial expression.  Normocephalic, atraumatic, face symmetric, no Cushingoid features. EYES:  Pupils equal round and reactive to light and accomodation.  No conjunctivitis or scleral icterus. ENT:  Oropharynx clear without lesion.  Tongue normal. Mucous membranes moist.  RESPIRATORY:  Clear to auscultation without rales, wheezes or rhonchi. CARDIOVASCULAR:  Regular rate and rhythm without murmur, rub or gallop. ABDOMEN:  Soft, non-tender, with active bowel sounds, and no hepatosplenomegaly.  No masses. SKIN:  No rashes, ulcers or  lesions. EXTREMITIES: Mild right lower extremity edema.  No erythema or induration. No tenderness.  No palpable cords. LYMPH NODES: No palpable cervical, supraclavicular, axillary or inguinal adenopathy  PSYCH:  Appropriate.  No visits with results within 3 Day(s) from this visit. Latest known visit with results is:  Admission on 07/18/2014, Discharged on 07/18/2014  Component Date Value Ref Range Status  . WBC 07/18/2014 22.3* 3.6 - 11.0 K/uL Final  . RBC 07/18/2014 4.48  3.80 - 5.20 MIL/uL Final  . Hemoglobin 07/18/2014 9.3* 12.0 - 16.0 g/dL Final  . HCT 07/18/2014 30.7* 35.0 - 47.0 % Final  . MCV 07/18/2014 68.4* 80.0 - 100.0 fL Final  . MCH 07/18/2014 20.8* 26.0 - 34.0 pg Final  . MCHC 07/18/2014 30.4* 32.0 - 36.0 g/dL Final  . RDW 07/18/2014 15.9* 11.5 - 14.5 % Final  . Platelets 07/18/2014 351  150 - 440 K/uL Final  . Neutrophils Relative % 07/18/2014 42%   Final  . Neutro Abs 07/18/2014 9.4* 1.4 - 6.5 K/uL Final  . Lymphocytes Relative 07/18/2014 51%   Final  . Lymphs Abs 07/18/2014 11.5* 1.0 - 3.6 K/uL Final  . Monocytes Relative 07/18/2014 5%   Final  . Monocytes Absolute 07/18/2014 1.1* 0.2 - 0.9  K/uL Final  . Eosinophils Relative 07/18/2014 1%   Final  . Eosinophils Absolute 07/18/2014 0.2  0 - 0.7 K/uL Final  . Basophils Relative 07/18/2014 1%   Final  . Basophils Absolute 07/18/2014 0.2* 0 - 0.1 K/uL Final  . Sodium 07/18/2014 141  135 - 145 mmol/L Final  . Potassium 07/18/2014 4.0  3.5 - 5.1 mmol/L Final  . Chloride 07/18/2014 108  101 - 111 mmol/L Final  . CO2 07/18/2014 28  22 - 32 mmol/L Final  . Glucose, Bld 07/18/2014 97  65 - 99 mg/dL Final  . BUN 07/18/2014 9  6 - 20 mg/dL Final  . Creatinine, Ser 07/18/2014 0.67  0.44 - 1.00 mg/dL Final  . Calcium 07/18/2014 8.8* 8.9 - 10.3 mg/dL Final  . Total Protein 07/18/2014 8.0  6.5 - 8.1 g/dL Final  . Albumin 07/18/2014 3.4* 3.5 - 5.0 g/dL Final  . AST 07/18/2014 19  15 - 41 U/L Final  . ALT 07/18/2014 17  14 - 54 U/L Final  . Alkaline Phosphatase 07/18/2014 102  38 - 126 U/L Final  . Total Bilirubin 07/18/2014 0.2* 0.3 - 1.2 mg/dL Final  . GFR calc non Af Amer 07/18/2014 >60  >60 mL/min Final  . GFR calc Af Amer 07/18/2014 >60  >60 mL/min Final   Comment: (NOTE) The eGFR has been calculated using the CKD EPI equation. This calculation has not been validated in all clinical situations. eGFR's persistently <60 mL/min signify possible Chronic Kidney Disease.   . Anion gap 07/18/2014 5  5 - 15 Final  . Troponin I 07/18/2014 <0.03  <0.031 ng/mL Final   Comment:        NO INDICATION OF MYOCARDIAL INJURY.   . Color, Urine 07/18/2014 STRAW* YELLOW Final  . APPearance 07/18/2014 CLEAR* CLEAR Final  . Glucose, UA 07/18/2014 NEGATIVE  NEGATIVE mg/dL Final  . Bilirubin Urine 07/18/2014 NEGATIVE  NEGATIVE Final  . Ketones, ur 07/18/2014 NEGATIVE  NEGATIVE mg/dL Final  . Specific Gravity, Urine 07/18/2014 1.006  1.005 - 1.030 Final  . Hgb urine dipstick 07/18/2014 NEGATIVE  NEGATIVE Final  . pH 07/18/2014 7.0  5.0 - 8.0 Final  . Protein, ur 07/18/2014 NEGATIVE  NEGATIVE mg/dL Final  . Nitrite 07/18/2014  NEGATIVE  NEGATIVE Final   . Leukocytes, UA 07/18/2014 NEGATIVE  NEGATIVE Final  . RBC / HPF 07/18/2014 NONE SEEN  0 - 5 RBC/hpf Final  . WBC, UA 07/18/2014 NONE SEEN  0 - 5 WBC/hpf Final  . Bacteria, UA 07/18/2014 NONE SEEN  NONE SEEN Final  . Squamous Epithelial / LPF 07/18/2014 0-5* NONE SEEN Final  . Mucous 07/18/2014 PRESENT   Final  . Glucose-Capillary 07/18/2014 88  65 - 99 mg/dL Final  . Prothrombin Time 07/18/2014 21.7* 11.4 - 15.0 seconds Final  . INR 07/18/2014 1.87   Final  . Phenytoin Lvl 07/18/2014 8.3* 10.0 - 20.0 ug/mL Final  . Specimen Description 07/18/2014 BLOOD   Final  . Special Requests 07/18/2014 Normal   Final  . Culture 07/18/2014 NO GROWTH 6 DAYS   Final  . Report Status 07/18/2014 07/24/2014 FINAL   Final  . Specimen Description 07/18/2014 URINE, RANDOM   Final  . Special Requests 07/18/2014 Normal   Final  . Culture 07/18/2014 NO GROWTH   Final  . Report Status 07/18/2014 07/20/2014 FINAL   Final    Assessment:  Dauna A Pfluger is a 54 y.o. female with a history of lymphocytosis and neutrophilia dating back to at least 09/2006. Flow cytometry on 11/2008 revealed a low-grade B-cell lymphoma (CD20 positive, CD5 negative). BCR-ABL testing was negative.    In association, she has had an IgM gammopathy. Monoclonal protein was 300 mg/dL in 11/2008 and then later 1.3 g/dL.  On 07/07/2014,  IgM was 2420 (high) with an IgG 1129 (normal).  SPEP with monoclonal spike in the beta region of 1.6 gm/dL.  LDH was 133.  Uric acid 5.3.    She has had chronic microcytic anemia since 2008. Hemoglobin has ranged between 10 and 11.  She has had negative stool guaiacs. B12 was normal. Iron saturation was 19%. Hemoglobin electrophoresis revealed a normal hemoglobin F and A2 thus ruling out beta thalassemia. Colonoscopy in 05/2011 was normal.  She has a history of an abnormal thyroid noted on CT scan with abnormal PET uptake in 2010. Ultrasound guided biopsy on 06/24/2012 was negative.  She has a seizure  disorder and a meningioma. Meningioma was 5 mm on 11/2008.  Head MRI on 07/14/2014 revealed small chronic subfrontal meningioma minimally increased in size from 5 mm to 8 mm since 2010.  There was asymmetric appearance of the mesial right temporal lobe including the right hippocampal formation.  Labs on 07/07/2014 revealed a hematocrit of 33.4, hemoglobin 10.0, MCV 69.2, platelets 275,000, WBC 22,800 with an ANC of 14,600. Absolute lymphocyte count (ALC) was 14,600.  Retic was 1.2% (low for level of anemia).  Ferritin was 34.  ESR was 20.   She has a history of bilateral DVT and pulmonary embolism in 2010. She developed a right lower extremity DVT on 07/09/2014 secondary to a sub-therapeutic INR.  She was also treated for a cellulitis.  Currently she is on Lovenox and Coumadin (managed by her PCP).   Symptomatically, she denies any complaints.  Exam reveals mild right lower extremity edema.  Plan: 1.  Review labs and head MRI. 2.  Discuss iron deficiency anemia.  Goal ferritin >= 100.  Discuss iron rich foods.  Discuss oral iron (ferrous sulfate 325 mg) with OJ or vitamin C. 3.  Obtain copy of 11/2008 flow cytometry. 4.  Follow-up with PCP regarding conversion of Lovenox to Coumadin. 5.  RTC in 1 month for MD assessment and labs (CBC with diff, ferritin, immunofixation,  free light chains).   Lequita Asal, MD  07/28/2014

## 2014-07-28 NOTE — Progress Notes (Signed)
Pt here today for follow up and MRI results; post hospitalization around July 12 th for 1 week due to blood clot in right leg; currently taking 7 mg Coumadin daily and Lovenox 100 mg .9 ml sq every 12 hours; lives in an assisted facility [facility]; Joelene Millin, personal care assistant with pt today

## 2014-08-05 DIAGNOSIS — D472 Monoclonal gammopathy: Secondary | ICD-10-CM | POA: Insufficient documentation

## 2014-08-10 DIAGNOSIS — I824Z1 Acute embolism and thrombosis of unspecified deep veins of right distal lower extremity: Secondary | ICD-10-CM | POA: Insufficient documentation

## 2014-08-10 DIAGNOSIS — Z86718 Personal history of other venous thrombosis and embolism: Secondary | ICD-10-CM | POA: Insufficient documentation

## 2014-08-28 ENCOUNTER — Inpatient Hospital Stay: Payer: Medicare Other | Attending: Hematology and Oncology

## 2014-08-28 ENCOUNTER — Inpatient Hospital Stay (HOSPITAL_BASED_OUTPATIENT_CLINIC_OR_DEPARTMENT_OTHER): Payer: Medicare Other | Admitting: Hematology and Oncology

## 2014-08-28 ENCOUNTER — Other Ambulatory Visit: Payer: Self-pay | Admitting: Hematology and Oncology

## 2014-08-28 VITALS — BP 114/72 | HR 76 | Temp 98.4°F | Resp 18 | Wt 176.4 lb

## 2014-08-28 DIAGNOSIS — Z86711 Personal history of pulmonary embolism: Secondary | ICD-10-CM | POA: Insufficient documentation

## 2014-08-28 DIAGNOSIS — D472 Monoclonal gammopathy: Secondary | ICD-10-CM | POA: Insufficient documentation

## 2014-08-28 DIAGNOSIS — Z86718 Personal history of other venous thrombosis and embolism: Secondary | ICD-10-CM | POA: Diagnosis not present

## 2014-08-28 DIAGNOSIS — Z79899 Other long term (current) drug therapy: Secondary | ICD-10-CM | POA: Insufficient documentation

## 2014-08-28 DIAGNOSIS — D509 Iron deficiency anemia, unspecified: Secondary | ICD-10-CM | POA: Diagnosis not present

## 2014-08-28 DIAGNOSIS — D329 Benign neoplasm of meninges, unspecified: Secondary | ICD-10-CM

## 2014-08-28 DIAGNOSIS — C8510 Unspecified B-cell lymphoma, unspecified site: Secondary | ICD-10-CM | POA: Diagnosis present

## 2014-08-28 LAB — CBC WITH DIFFERENTIAL/PLATELET
Basophils Absolute: 0.1 10*3/uL (ref 0–0.1)
Basophils Relative: 0 %
Eosinophils Absolute: 0.1 10*3/uL (ref 0–0.7)
Eosinophils Relative: 1 %
HCT: 32.8 % — ABNORMAL LOW (ref 35.0–47.0)
Hemoglobin: 10.2 g/dL — ABNORMAL LOW (ref 12.0–16.0)
Lymphocytes Relative: 68 %
Lymphs Abs: 15 10*3/uL — ABNORMAL HIGH (ref 1.0–3.6)
MCH: 21 pg — ABNORMAL LOW (ref 26.0–34.0)
MCHC: 31 g/dL — ABNORMAL LOW (ref 32.0–36.0)
MCV: 67.7 fL — ABNORMAL LOW (ref 80.0–100.0)
Monocytes Absolute: 0.8 10*3/uL (ref 0.2–0.9)
Monocytes Relative: 4 %
Neutro Abs: 5.8 10*3/uL (ref 1.4–6.5)
Neutrophils Relative %: 27 %
Platelets: 257 10*3/uL (ref 150–440)
RBC: 4.84 MIL/uL (ref 3.80–5.20)
RDW: 16.3 % — ABNORMAL HIGH (ref 11.5–14.5)
WBC: 21.9 10*3/uL — ABNORMAL HIGH (ref 3.6–11.0)

## 2014-08-28 LAB — FERRITIN: Ferritin: 30 ng/mL (ref 11–307)

## 2014-08-28 NOTE — Progress Notes (Signed)
[facility] day:  08/28/2014  Chief Complaint: Laurie Rice is a 54 y.o. female with a low grade B cell lymphoma, microcytic anemia, and meningioma who is seen for 1 month assessment.  HPI: The patient was last seen in the medical oncology clinic on 07/28/2014.  At that time,  she was seen for review of interval labs and head MRI.  MRI had revealed an 8 mm meningioma in the subfrontal area increased from 5 mm in 2010.  She had been admitted to [facility] on 07/13/2014 secondary to right lower extremity cellulitis as well as acute DVT. She had a sub-therapeutic INR. At last visit, she was converting from Lovenox to Coumadin.  During the interim, she has felt "all right". She denies any seizures. She is on Coumadin 7 mg a day. INR is checked monthly by Dr. Lovie Macadamia. She denies any bruising or bleeding. She is taking her oral iron.   Past Medical History  Diagnosis Date  . Seizures   . Mild mental retardation   . Meningioma   . 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 Personal history of pulmonary embolism   . History of lymphocytosis   . Monoclonal gammopathy     low level  . Anemia     chronic, mcv chronically low  . Cellulitis     History of  . Impetigo     History of  . Liver masses     History of, Benign  . Thyroid mass     history of    Past Surgical History  Procedure Laterality Date  . No past surgeries      Family History  Problem Relation Age of Onset  . Hypertension    . Arthritis      Social History:  reports that she has never smoked. She does not have any smokeless tobacco history on file. She reports that she does not drink alcohol or use illicit drugs.  The patient is accompanied by Verdie Drown, daughter of patient's caregiver, from family care at [facility] family home.  She spends her days watching TV.  She can dress on her own.  She needs help with showers.  Allergies:  No Known Allergies  Current Medications: Current Outpatient Prescriptions  Medication Sig Dispense Refill  . Emollient (CETAPHIL) cream APPLY SMALL AMOUNT TO AFFECTED AREAS ONCE DAILY    . PHENobarbital (LUMINAL) 32.4 MG tablet Take 64.8 mg by mouth at bedtime.     . phenytoin (DILANTIN) 100 MG ER capsule TAKE 2 CAPSULES BY MOUTH AT BEDTIME (SEIZURE DISORDER)    . phenytoin (DILANTIN) 50 MG tablet Chew 50 mg by mouth at bedtime.    . triamcinolone ointment (KENALOG) 0.1 % Apply topically.    . warfarin (COUMADIN) 1 MG tablet TAKE 2 TABLETS (2MG) BY MOUTH ONCE DAILY (ALONG W/ 5MG=7MG TOTAL DOSAGE).    Marland Kitchen warfarin (COUMADIN) 5 MG tablet TAKE 1 TABLET BY MOUTH EVERY DAY (ALONG W/ 2MG=7MG TOTAL DOSAGE).     No current facility-administered medications for this visit.    Review of Systems:  GENERAL:  Feels "alright".  No fevers or sweats.  Denies weight loss. PERFORMANCE STATUS (ECOG):  3 HEENT:  No visual changes, runny nose, sore throat, mouth sores or tenderness. Lungs: No shortness of breath or cough.  No hemoptysis. Cardiac:  No chest pain, palpitations, orthopnea, or PND. GI:  No nausea, vomiting, diarrhea, constipation, melena or  hematochezia. GU:  No urgency, frequency, dysuria, or hematuria. Musculoskeletal:  No back pain.  No joint pain.  No muscle tenderness. Extremities:  No pain or swelling. Skin:  No rashes or skin changes. Neuro:  No seizures.  No headache, numbness or weakness, balance or coordination issues. Endocrine:  No diabetes, thyroid issues, hot flashes or night sweats. Psych:  No mood changes, depression or anxiety. Pain:  No focal pain. Review of systems:  All other systems reviewed and found to be negative.   Physical Exam: Blood pressure 114/72, pulse 76, temperature 98.4 F (36.9 C), temperature source Tympanic, resp. rate 18, weight 176 lb 5.9 oz (80 kg). GENERAL:  Well developed, well nourished, sitting comfortably in the exam room in no acute distress.   Rolling walker at her side.  She requires assistance onto the exam table. MENTAL STATUS:  Alert and oriented to person, place and time. HEAD:  Flat facial expression.  Normocephalic, atraumatic, face symmetric, no Cushingoid features. EYES:  Pupils equal round and reactive to light and accomodation.  No conjunctivitis or scleral icterus. ENT:  Oropharynx clear without lesion.  Dentures.  Tongue normal. Mucous membranes moist.  RESPIRATORY:  Clear to auscultation without rales, wheezes or rhonchi. CARDIOVASCULAR:  Regular rate and rhythm without murmur, rub or gallop. ABDOMEN:  Soft, non-tender, with active bowel sounds, and no hepatosplenomegaly.  No masses. SKIN:  No rashes, ulcers or lesions. EXTREMITIES: Chronic lower extremity changes.  No skin discoloration or tenderness.  No palpable cords. LYMPH NODES: No palpable cervical, supraclavicular, axillary or inguinal adenopathy  PSYCH:  Appropriate.  Appointment on 08/28/2014  Component Date Value Ref Range Status  . WBC 08/28/2014 21.9* 3.6 - 11.0 K/uL Final  . RBC 08/28/2014 4.84  3.80 - 5.20 MIL/uL Final  . Hemoglobin 08/28/2014 10.2* 12.0 - 16.0 g/dL Final  . HCT 08/28/2014 32.8* 35.0 - 47.0 % Final  . MCV 08/28/2014 67.7* 80.0 - 100.0 fL Final  . MCH 08/28/2014 21.0* 26.0 - 34.0 pg Final  . MCHC 08/28/2014 31.0* 32.0 - 36.0 g/dL Final  . RDW 08/28/2014 16.3* 11.5 - 14.5 % Final  . Platelets 08/28/2014 257  150 - 440 K/uL Final  . Neutrophils Relative % 08/28/2014 27   Final  . Neutro Abs 08/28/2014 5.8  1.4 - 6.5 K/uL Final  . Lymphocytes Relative 08/28/2014 68   Final  . Lymphs Abs 08/28/2014 15.0* 1.0 - 3.6 K/uL Final  . Monocytes Relative 08/28/2014 4   Final  . Monocytes Absolute 08/28/2014 0.8  0.2 - 0.9 K/uL Final  . Eosinophils Relative 08/28/2014 1   Final  . Eosinophils Absolute 08/28/2014 0.1  0 - 0.7 K/uL Final  . Basophils Relative 08/28/2014 0   Final  . Basophils Absolute 08/28/2014 0.1  0 - 0.1 K/uL Final  .  Ferritin 08/28/2014 30  11 - 307 ng/mL Final  . Total Protein ELP 08/28/2014 7.5  6.0 - 8.5 g/dL Final  . IgG (Immunoglobin G), Serum 08/28/2014 836  700 - 1600 mg/dL Final  . IgA 08/28/2014 142  87 - 352 mg/dL Final  . IgM, Serum 08/28/2014 2267* 26 - 217 mg/dL Final   Comment: (NOTE) Performed At: [facility] [place], [place] [id number] Lindon Romp MD WU:[id number]   . Immunofixation Result, Serum 08/28/2014 Comment   Corrected   Comment: (NOTE) Immunofixation shows IgM monoclonal protein with lambda light chain specificity.   . Kappa free light chain 08/28/2014 8.60  3.30 - 19.40 mg/L Final  .  Lamda free light chains 08/28/2014 45.82* 5.71 - 26.30 mg/L Final  . Kappa, lamda light chain ratio 08/28/2014 0.19* 0.26 - 1.65 Final   Comment: (NOTE) Performed At: [facility] [address] [place], [place] [id number] Lindon Romp MD QQ:[id number]     Assessment:  LANYIA JEWEL is a 54 y.o. female with  a history of lymphocytosis and neutrophilia dating back to at least 09/2006. Flow cytometry on 11/25/2008 revealed a low-grade B-cell lymphoma (CD20 positive, CD5 negative).  Phenotype did not suggest a specific histologic type and was not typical for CLL/SLL, mantle cell lymphoma, hairy cell leukemia or follicular center cell lymphoma. BCR-ABL testing was negative.    In association, she has had an IgM gammopathy. Monoclonal protein was 300 mg/dL in 11/2008 and then later 1.3 g/dL.  On 07/07/2014,  IgM was 2420 (high) with an IgG 1129 (normal).  SPEP with monoclonal spike in the beta region of 1.6 gm/dL.  LDH was 133.  Uric acid 5.3.    She has had chronic microcytic anemia since 2008. Hemoglobin has ranged between 10 and 11.  She has had negative stool guaiacs. B12 was normal. Iron saturation was 19%. Hemoglobin electrophoresis revealed a normal hemoglobin F and A2 thus ruling out beta thalassemia. Colonoscopy in 05/2011 was normal.  She  has a history of an abnormal thyroid noted on CT scan with abnormal PET uptake in 2010. Ultrasound guided biopsy on 06/24/2012 was negative.  She has a seizure disorder and a meningioma. Meningioma was 5 mm on 11/2008.  Head MRI on 07/14/2014 revealed small chronic subfrontal meningioma minimally increased in size from 5 mm to 8 mm since 2010.  There was asymmetric appearance of the mesial right temporal lobe including the right hippocampal formation.  Labs on 07/07/2014 revealed a hematocrit of 33.4, hemoglobin 10.0, MCV 69.2, platelets 275,000, WBC 22,800 with an ANC of 14,600. Absolute lymphocyte count (ALC) was 14,600.  Retic was 1.2% (low for level of anemia).  Ferritin was 34.  ESR was 20.  She has a history of bilateral DVT and pulmonary embolism in 2010. She developed a right lower extremity DVT on 07/09/2014 secondary to a sub-therapeutic INR.  She was also treated for a cellulitis.  Currently is on Coumadin 7 mg a day (managed by her PCP).   Symptomatically, she denies any complaints.  Exam reveals chronic lower extremity edema.  Plan: 1.  Labs today:  CBC with diff, ferritin, immunofixation, free light chains. 2.  Discuss taking oral iron with orange juice. 3.  Obtain copy of 11/2008 flow cytometry- done. 4.  RTC in 2 months for MD assessment and labs (CBC with diff, ferritin, iron studies, IgM, SPEP).   Lequita Asal, MD  08/28/2014, 3:46 PM

## 2014-08-30 LAB — KAPPA/LAMBDA LIGHT CHAINS
Kappa free light chain: 8.6 mg/L (ref 3.30–19.40)
Kappa, lambda light chain ratio: 0.19 — ABNORMAL LOW (ref 0.26–1.65)
Lambda free light chains: 45.82 mg/L — ABNORMAL HIGH (ref 5.71–26.30)

## 2014-08-31 LAB — IMMUNOFIXATION ELECTROPHORESIS
IgA: 142 mg/dL (ref 87–352)
IgG (Immunoglobin G), Serum: 836 mg/dL (ref 700–1600)
IgM, Serum: 2267 mg/dL — ABNORMAL HIGH (ref 26–217)
Total Protein ELP: 7.5 g/dL (ref 6.0–8.5)

## 2014-10-29 ENCOUNTER — Inpatient Hospital Stay: Payer: Medicare Other | Admitting: Hematology and Oncology

## 2014-10-29 ENCOUNTER — Inpatient Hospital Stay: Payer: Medicare Other | Attending: Hematology and Oncology

## 2014-11-09 ENCOUNTER — Encounter: Payer: Self-pay | Admitting: Hematology and Oncology

## 2014-11-09 ENCOUNTER — Inpatient Hospital Stay: Payer: Medicare Other

## 2014-11-09 ENCOUNTER — Inpatient Hospital Stay: Payer: Medicare Other | Attending: Hematology and Oncology | Admitting: Hematology and Oncology

## 2014-11-09 VITALS — BP 140/80 | HR 72 | Temp 95.0°F | Resp 18 | Ht 66.0 in | Wt 191.8 lb

## 2014-11-09 DIAGNOSIS — Z7901 Long term (current) use of anticoagulants: Secondary | ICD-10-CM | POA: Diagnosis not present

## 2014-11-09 DIAGNOSIS — D509 Iron deficiency anemia, unspecified: Secondary | ICD-10-CM | POA: Diagnosis not present

## 2014-11-09 DIAGNOSIS — Z86718 Personal history of other venous thrombosis and embolism: Secondary | ICD-10-CM | POA: Diagnosis not present

## 2014-11-09 DIAGNOSIS — C8510 Unspecified B-cell lymphoma, unspecified site: Secondary | ICD-10-CM | POA: Insufficient documentation

## 2014-11-09 DIAGNOSIS — D472 Monoclonal gammopathy: Secondary | ICD-10-CM | POA: Insufficient documentation

## 2014-11-09 DIAGNOSIS — F70 Mild intellectual disabilities: Secondary | ICD-10-CM | POA: Diagnosis not present

## 2014-11-09 DIAGNOSIS — Z79899 Other long term (current) drug therapy: Secondary | ICD-10-CM | POA: Diagnosis not present

## 2014-11-09 DIAGNOSIS — Z86711 Personal history of pulmonary embolism: Secondary | ICD-10-CM

## 2014-11-09 DIAGNOSIS — D329 Benign neoplasm of meninges, unspecified: Secondary | ICD-10-CM | POA: Diagnosis not present

## 2014-11-09 LAB — IRON AND TIBC
Iron: 66 ug/dL (ref 28–170)
Saturation Ratios: 29 % (ref 10.4–31.8)
TIBC: 225 ug/dL — ABNORMAL LOW (ref 250–450)
UIBC: 159 ug/dL

## 2014-11-09 LAB — CBC WITH DIFFERENTIAL/PLATELET
Basophils Absolute: 0 10*3/uL (ref 0–0.1)
Basophils Relative: 0 %
Eosinophils Absolute: 0.2 10*3/uL (ref 0–0.7)
Eosinophils Relative: 1 %
HCT: 33.9 % — ABNORMAL LOW (ref 35.0–47.0)
Hemoglobin: 10.5 g/dL — ABNORMAL LOW (ref 12.0–16.0)
Lymphocytes Relative: 70 %
Lymphs Abs: 19.7 10*3/uL — ABNORMAL HIGH (ref 1.0–3.6)
MCH: 20.9 pg — ABNORMAL LOW (ref 26.0–34.0)
MCHC: 30.9 g/dL — ABNORMAL LOW (ref 32.0–36.0)
MCV: 67.5 fL — ABNORMAL LOW (ref 80.0–100.0)
Monocytes Absolute: 0.9 10*3/uL (ref 0.2–0.9)
Monocytes Relative: 3 %
Neutro Abs: 7.3 10*3/uL — ABNORMAL HIGH (ref 1.4–6.5)
Neutrophils Relative %: 26 %
Platelets: 265 10*3/uL (ref 150–440)
RBC: 5.02 MIL/uL (ref 3.80–5.20)
RDW: 16.7 % — ABNORMAL HIGH (ref 11.5–14.5)
WBC: 28.1 10*3/uL — ABNORMAL HIGH (ref 3.6–11.0)

## 2014-11-09 LAB — FERRITIN: Ferritin: 31 ng/mL (ref 11–307)

## 2014-11-09 NOTE — Progress Notes (Signed)
[facility] day:  11/09/2014   Chief Complaint: MERCER STALLWORTH is a 54 y.o. female with a low grade B cell lymphoma, iron deficiency anemia, and meningioma who is seen for 2 month assessment.  HPI: The patient was last seen in the medical oncology clinic on 08/28/2014.  At that time,  she felt "all right". She denied any seizures. She was on Coumadin 7 mg a day. INR was being checked monthly by Dr. Lovie Macadamia. She denied any bruising or bleeding. She was taking her oral iron.   Labs at last visit included a hematocrit 32.8, hemoglobin 10.2, MCV 67.7, platelets 257,000, white count 21,900 with an Story of 5800.  Absolute lymphocyte count was 15,000.  Ferritin was 30.  IgM was 2267.  Kappa free light chains were 8.6, lambda free light chains 45.82 (high) and kappa:lambda ratio of 0.19 (low).  It was recommended that she take her oral iron with orange juice or vitamin C.  During the interim, she voices no concerns.  She has no apparent bleeding.   Past Medical History  Diagnosis Date  . Seizures ([place])   . Mild mental retardation   . Meningioma ([place])   . 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 Personal history of pulmonary embolism   . History of lymphocytosis   . Monoclonal gammopathy     low level  . Anemia     chronic, mcv chronically low  . Cellulitis     History of  . Impetigo     History of  . Liver masses     History of, Benign  . Thyroid mass     history of    Past Surgical History  Procedure Laterality Date  . No past surgeries      Family History  Problem Relation Age of Onset  . Hypertension    . Arthritis      Social History:  reports that she has never smoked. She does not have any smokeless tobacco history on file. She reports that she does not drink alcohol or use illicit drugs.  The patient is accompanied by Verdie Drown, daughter of patient's caregiver, from family care at [facility] family  home.  She spends her days watching TV.  She can dress on her own.  She needs help with showers.  Allergies: No Known Allergies  Current Medications: Current Outpatient Prescriptions  Medication Sig Dispense Refill  . Emollient (CETAPHIL) cream APPLY SMALL AMOUNT TO AFFECTED AREAS ONCE DAILY    . PHENobarbital (LUMINAL) 32.4 MG tablet Take 64.8 mg by mouth at bedtime.     . phenytoin (DILANTIN) 100 MG ER capsule TAKE 2 CAPSULES BY MOUTH AT BEDTIME (SEIZURE DISORDER)    . phenytoin (DILANTIN) 50 MG tablet Chew 50 mg by mouth at bedtime.    Marland Kitchen warfarin (COUMADIN) 1 MG tablet TAKE 2 TABLETS (2MG) BY MOUTH ONCE DAILY (ALONG W/ 5MG=7MG TOTAL DOSAGE).    Marland Kitchen warfarin (COUMADIN) 5 MG tablet TAKE 1 TABLET BY MOUTH EVERY DAY (ALONG W/ 2MG=7MG TOTAL DOSAGE).    Marland Kitchen triamcinolone ointment (KENALOG) 0.1 % Apply topically.     No current facility-administered medications for this visit.    Review of Systems:  GENERAL:  No changes.  No fevers or sweats.  Weight gain (? 15 pounds). PERFORMANCE STATUS (ECOG):  3 HEENT:  No visual changes, runny nose, sore throat, mouth sores or tenderness. Lungs: No shortness of breath  or cough.  No hemoptysis. Cardiac:  No chest pain, palpitations, orthopnea, or PND. GI:  No nausea, vomiting, diarrhea, constipation, melena or hematochezia. GU:  No urgency, frequency, dysuria, or hematuria. Musculoskeletal:  No back pain.  No joint pain.  No muscle tenderness. Extremities:  No pain or swelling. Skin:  No rashes or skin changes. Neuro:  Unsteady gait.  No seizures.  No headache, numbness or weakness, balance or coordination issues. Endocrine:  No diabetes, thyroid issues, hot flashes or night sweats. Psych:  No mood changes, depression or anxiety. Pain:  No focal pain. Review of systems:  All other systems reviewed and found to be negative.   Physical Exam: Blood pressure 140/80, pulse 72, temperature 95 F (35 C), temperature source Tympanic, resp. rate 18, height 5'  6" (1.676 m), weight 191 lb 12.8 oz (87 kg). GENERAL:  Well developed, well nourished, sitting comfortably in the exam room in no acute distress.   MENTAL STATUS:  Alert and oriented to person, place and time. HEAD:  Flat facial expression.  Normocephalic, atraumatic, face symmetric, no Cushingoid features. EYES:  Pupils equal round and reactive to light and accomodation.  No conjunctivitis or scleral icterus. ENT:  Oropharynx clear without lesion.  Dentures.  Tongue normal. Mucous membranes moist.  RESPIRATORY:  Clear to auscultation without rales, wheezes or rhonchi. CARDIOVASCULAR:  Regular rate and rhythm without murmur, rub or gallop. ABDOMEN:  Soft, non-tender, with active bowel sounds, and no hepatosplenomegaly.  No masses. SKIN:  No rashes, ulcers or lesions. EXTREMITIES: Chronic lower extremity changes.  No skin discoloration or tenderness.  No palpable cords. LYMPH NODES: No palpable cervical, supraclavicular, axillary or inguinal adenopathy  PSYCH:  Appropriate.  Appointment on 11/09/2014  Component Date Value Ref Range Status  . WBC 11/09/2014 28.1* 3.6 - 11.0 K/uL Final  . RBC 11/09/2014 5.02  3.80 - 5.20 MIL/uL Final  . Hemoglobin 11/09/2014 10.5* 12.0 - 16.0 g/dL Final  . HCT 11/09/2014 33.9* 35.0 - 47.0 % Final  . MCV 11/09/2014 67.5* 80.0 - 100.0 fL Final  . MCH 11/09/2014 20.9* 26.0 - 34.0 pg Final  . MCHC 11/09/2014 30.9* 32.0 - 36.0 g/dL Final  . RDW 11/09/2014 16.7* 11.5 - 14.5 % Final  . Platelets 11/09/2014 265  150 - 440 K/uL Final  . Neutrophils Relative % 11/09/2014 26   Final  . Neutro Abs 11/09/2014 7.3* 1.4 - 6.5 K/uL Final  . Lymphocytes Relative 11/09/2014 70   Final  . Lymphs Abs 11/09/2014 19.7* 1.0 - 3.6 K/uL Final  . Monocytes Relative 11/09/2014 3   Final  . Monocytes Absolute 11/09/2014 0.9  0.2 - 0.9 K/uL Final  . Eosinophils Relative 11/09/2014 1   Final  . Eosinophils Absolute 11/09/2014 0.2  0 - 0.7 K/uL Final  . Basophils Relative 11/09/2014  0   Final  . Basophils Absolute 11/09/2014 0.0  0 - 0.1 K/uL Final  . Ferritin 11/09/2014 31  11 - 307 ng/mL Final  . Iron 11/09/2014 66  28 - 170 ug/dL Final  . TIBC 11/09/2014 225* 250 - 450 ug/dL Final  . Saturation Ratios 11/09/2014 29  10.4 - 31.8 % Final  . UIBC 11/09/2014 159   Final    Assessment:  Addaleigh A Zollars is a 54 y.o. female with  a history of lymphocytosis and neutrophilia dating back to at least 09/2006. Flow cytometry on 11/25/2008 revealed a low-grade B-cell lymphoma (CD20 positive, CD5 negative).  Phenotype did not suggest a specific histologic type and  was not typical for CLL/SLL, mantle cell lymphoma, hairy cell leukemia or follicular center cell lymphoma. BCR-ABL testing was negative.    In association, she has had an IgM gammopathy. Monoclonal protein was 300 mg/dL in 11/2008 and then later 1.3 g/dL.  On 07/07/2014,  IgM was 2420 (high) with an IgG 1129 (normal).  SPEP with monoclonal spike in the beta region of 1.6 gm/dL.  LDH was 133.  Uric acid 5.3.    She has had chronic microcytic anemia since 2008. Hemoglobin has ranged between 10 and 11.  She has had negative stool guaiacs. B12 was normal. Iron saturation was 19%. Hemoglobin electrophoresis revealed a normal hemoglobin F and A2 thus ruling out beta thalassemia. Colonoscopy in 05/2011 was normal.  She has a history of an abnormal thyroid noted on CT scan with abnormal PET uptake in 2010. Ultrasound guided biopsy on 06/24/2012 was negative.  She has a seizure disorder and a meningioma. Meningioma was 5 mm on 11/2008.  Head MRI on 07/14/2014 revealed small chronic subfrontal meningioma minimally increased in size from 5 mm to 8 mm since 2010.  There was asymmetric appearance of the mesial right temporal lobe including the right hippocampal formation.  Labs on 07/07/2014 revealed a hematocrit of 33.4, hemoglobin 10.0, MCV 69.2, platelets 275,000, WBC 22,800 with an ANC of 14,600. Absolute lymphocyte count (ALC)  was 14,600.  Retic was 1.2% (low for level of anemia).  Ferritin was 34.  ESR was 20.  Ferritin goal is 100.  She has a history of bilateral DVT and pulmonary embolism in 2010. She developed a right lower extremity DVT on 07/09/2014 secondary to a sub-therapeutic INR.  She was also treated for a cellulitis.  Currently is on Coumadin 7 mg a day (managed by her PCP).   Symptomatically, she denies any complaints.  Exam reveals chronic lower extremity edema.  She has a chronic stable microcytic anemia.  Plan: 1.  Labs today:  CBC with diff, ferritin, iron studies, SPEP, immunoglobulins. 2.  Complete facility paperwork (Ferrous sulfate 325 mg po q day with OJ and Encourage iron rich foods).  3.  Anticipate bone marrow and/or imaging studies if IgM rising, increasing anemia, or symptoms. 4.  RTC in 3 months for MD assessment and labs (CBC with diff, BMP, uric acid, ferritin, iron studies, IgM, SPEP).   Lequita Asal, MD  11/09/2014, 4:08 PM

## 2014-11-09 NOTE — Progress Notes (Signed)
Patient is here for follow-up of low grade B cell lymphoma, microcytic anemia. Patient has been doing well and offers no complaints today.

## 2014-11-10 LAB — IGG, IGA, IGM
IgA: 143 mg/dL (ref 87–352)
IgG (Immunoglobin G), Serum: 933 mg/dL (ref 700–1600)
IgM, Serum: 2510 mg/dL — ABNORMAL HIGH (ref 26–217)

## 2014-11-11 LAB — PROTEIN ELECTROPHORESIS, SERUM
A/G Ratio: 0.9 (ref 0.7–1.7)
Albumin ELP: 3.7 g/dL (ref 2.9–4.4)
Alpha-1-Globulin: 0.3 g/dL (ref 0.0–0.4)
Alpha-2-Globulin: 0.8 g/dL (ref 0.4–1.0)
Beta Globulin: 2.6 g/dL — ABNORMAL HIGH (ref 0.7–1.3)
Gamma Globulin: 0.7 g/dL (ref 0.4–1.8)
Globulin, Total: 4.3 g/dL — ABNORMAL HIGH (ref 2.2–3.9)
M-Spike, %: 1.6 g/dL — ABNORMAL HIGH
Total Protein ELP: 8 g/dL (ref 6.0–8.5)

## 2014-12-09 ENCOUNTER — Telehealth: Payer: Self-pay | Admitting: *Deleted

## 2014-12-09 MED ORDER — FERROUS SULFATE 325 (65 FE) MG PO TBEC: 325.0000 mg | DELAYED_RELEASE_TABLET | Freq: Every day | ORAL | Status: AC

## 2014-12-09 NOTE — Telephone Encounter (Signed)
Escribed

## 2015-02-12 ENCOUNTER — Other Ambulatory Visit: Payer: Self-pay

## 2015-02-12 DIAGNOSIS — C8510 Unspecified B-cell lymphoma, unspecified site: Secondary | ICD-10-CM

## 2015-02-13 ENCOUNTER — Encounter: Payer: Self-pay | Admitting: Hematology and Oncology

## 2015-02-16 ENCOUNTER — Inpatient Hospital Stay: Payer: Medicare Other | Admitting: Hematology and Oncology

## 2015-02-16 ENCOUNTER — Inpatient Hospital Stay: Payer: Medicare Other | Attending: Hematology and Oncology

## 2015-02-16 DIAGNOSIS — D509 Iron deficiency anemia, unspecified: Secondary | ICD-10-CM | POA: Insufficient documentation

## 2015-02-16 DIAGNOSIS — D472 Monoclonal gammopathy: Secondary | ICD-10-CM | POA: Insufficient documentation

## 2015-02-16 DIAGNOSIS — Z7901 Long term (current) use of anticoagulants: Secondary | ICD-10-CM | POA: Insufficient documentation

## 2015-02-16 DIAGNOSIS — C8510 Unspecified B-cell lymphoma, unspecified site: Secondary | ICD-10-CM | POA: Insufficient documentation

## 2015-02-16 DIAGNOSIS — F70 Mild intellectual disabilities: Secondary | ICD-10-CM | POA: Insufficient documentation

## 2015-02-16 DIAGNOSIS — D329 Benign neoplasm of meninges, unspecified: Secondary | ICD-10-CM | POA: Insufficient documentation

## 2015-02-16 DIAGNOSIS — Z86718 Personal history of other venous thrombosis and embolism: Secondary | ICD-10-CM | POA: Insufficient documentation

## 2015-02-16 DIAGNOSIS — Z86711 Personal history of pulmonary embolism: Secondary | ICD-10-CM | POA: Insufficient documentation

## 2015-02-16 DIAGNOSIS — Z79899 Other long term (current) drug therapy: Secondary | ICD-10-CM | POA: Insufficient documentation

## 2015-02-26 ENCOUNTER — Inpatient Hospital Stay: Payer: Medicare Other

## 2015-02-26 ENCOUNTER — Inpatient Hospital Stay (HOSPITAL_BASED_OUTPATIENT_CLINIC_OR_DEPARTMENT_OTHER): Payer: Medicare Other | Admitting: Hematology and Oncology

## 2015-02-26 VITALS — BP 142/84 | HR 81 | Temp 98.8°F | Resp 18 | Ht 66.0 in | Wt 187.4 lb

## 2015-02-26 DIAGNOSIS — Z86718 Personal history of other venous thrombosis and embolism: Secondary | ICD-10-CM | POA: Diagnosis not present

## 2015-02-26 DIAGNOSIS — D472 Monoclonal gammopathy: Secondary | ICD-10-CM

## 2015-02-26 DIAGNOSIS — Z79899 Other long term (current) drug therapy: Secondary | ICD-10-CM

## 2015-02-26 DIAGNOSIS — D509 Iron deficiency anemia, unspecified: Secondary | ICD-10-CM | POA: Diagnosis not present

## 2015-02-26 DIAGNOSIS — Z7901 Long term (current) use of anticoagulants: Secondary | ICD-10-CM | POA: Diagnosis not present

## 2015-02-26 DIAGNOSIS — Z86711 Personal history of pulmonary embolism: Secondary | ICD-10-CM | POA: Diagnosis not present

## 2015-02-26 DIAGNOSIS — D329 Benign neoplasm of meninges, unspecified: Secondary | ICD-10-CM

## 2015-02-26 DIAGNOSIS — C8510 Unspecified B-cell lymphoma, unspecified site: Secondary | ICD-10-CM

## 2015-02-26 DIAGNOSIS — F70 Mild intellectual disabilities: Secondary | ICD-10-CM | POA: Diagnosis not present

## 2015-02-26 LAB — CBC WITH DIFFERENTIAL/PLATELET
Basophils Absolute: 0.1 10*3/uL (ref 0–0.1)
Basophils Relative: 0 %
Eosinophils Absolute: 0.2 10*3/uL (ref 0–0.7)
Eosinophils Relative: 1 %
HCT: 32.3 % — ABNORMAL LOW (ref 35.0–47.0)
Hemoglobin: 10.2 g/dL — ABNORMAL LOW (ref 12.0–16.0)
Lymphocytes Relative: 61 %
Lymphs Abs: 10.1 10*3/uL — ABNORMAL HIGH (ref 1.0–3.6)
MCH: 21.7 pg — ABNORMAL LOW (ref 26.0–34.0)
MCHC: 31.7 g/dL — ABNORMAL LOW (ref 32.0–36.0)
MCV: 68.4 fL — ABNORMAL LOW (ref 80.0–100.0)
Monocytes Absolute: 1.2 10*3/uL — ABNORMAL HIGH (ref 0.2–0.9)
Monocytes Relative: 7 %
Neutro Abs: 5.3 10*3/uL (ref 1.4–6.5)
Neutrophils Relative %: 31 %
Platelets: 211 10*3/uL (ref 150–440)
RBC: 4.73 MIL/uL (ref 3.80–5.20)
RDW: 16.7 % — ABNORMAL HIGH (ref 11.5–14.5)
WBC: 16.9 10*3/uL — ABNORMAL HIGH (ref 3.6–11.0)

## 2015-02-26 LAB — BASIC METABOLIC PANEL
Anion gap: 7 (ref 5–15)
BUN: 13 mg/dL (ref 6–20)
CO2: 26 mmol/L (ref 22–32)
Calcium: 8.9 mg/dL (ref 8.9–10.3)
Chloride: 103 mmol/L (ref 101–111)
Creatinine, Ser: 0.66 mg/dL (ref 0.44–1.00)
GFR calc Af Amer: >60 mL/min (ref >60)
GFR calc non Af Amer: >60 mL/min (ref >60)
Glucose, Bld: 94 mg/dL (ref 65–99)
Potassium: 4 mmol/L (ref 3.5–5.1)
Sodium: 136 mmol/L (ref 135–145)

## 2015-02-26 LAB — FOLATE: Folate: 15 ng/mL (ref >5.9)

## 2015-02-26 LAB — URIC ACID: Uric Acid, Serum: 4.5 mg/dL (ref 2.3–6.6)

## 2015-02-26 LAB — FERRITIN: Ferritin: 63 ng/mL (ref 11–307)

## 2015-02-26 NOTE — Progress Notes (Signed)
[facility] day:  02/26/2015   Chief Complaint: Laurie Rice is a 55 y.o. female with a low grade B cell lymphoma, iron deficiency anemia, and meningioma who is seen for 3 month assessment.  HPI: The patient was last seen in the medical oncology clinic on 11/09/2014.  At that time,  she denied any complaints.  Exam revealed chronic lower extremity edema.  She had a chronic stable microcytic anemia.  She was encouraged to eat iron rich foods and take ferrous sulfate 325 mg po q day with OJ.  She was residing at the [facility] family home.  During the interim, she states that she has lost weight (4 pounds) as she is exercising.  She states that she is eating all right as she "follows the menu".  She eats cereal, eggs, grapes, donuts, and occasional pork chops and chicken.  She denies any melena or hematochezia.  She denies menses.  She states that she takes an iron pill daily with orange juice.  Her day consists of watching TV.  She likes to fold towels and clean the bird cage.  Past Medical History  Diagnosis Date  . Seizures ([place])   . Mild mental retardation   . Meningioma ([place])   . 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 Personal history of pulmonary embolism   . History of lymphocytosis   . Monoclonal gammopathy     low level  . Anemia     chronic, mcv chronically low  . Cellulitis     History of  . Impetigo     History of  . Liver masses     History of, Benign  . Thyroid mass     history of    Past Surgical History  Procedure Laterality Date  . No past surgeries      Family History  Problem Relation Age of Onset  . Hypertension    . Arthritis      Social History:  reports that she has never smoked. She does not have any smokeless tobacco history on file. She reports that she does not drink alcohol or use illicit drugs.  The patient is accompanied by Laurie Rice, daughter of patient's caregiver, from  family care at [facility] family home.  Per the patient, 6 women live at the [facility].  Contact person is Laurie Rice [phone]).  The patient spends her days watching TV.  She can dress on her own.  She needs help with showers.  She likes to fold towels and clean the bird cage.  Allergies: No Known Allergies  Current Medications: Current Outpatient Prescriptions  Medication Sig Dispense Refill  . Emollient (CETAPHIL) cream APPLY SMALL AMOUNT TO AFFECTED AREAS ONCE DAILY    . ferrous sulfate 325 (65 FE) MG EC tablet Take 1 tablet (325 mg total) by mouth daily with breakfast. With orange juice 30 tablet 3  . PHENobarbital (LUMINAL) 32.4 MG tablet Take 64.8 mg by mouth at bedtime.     . phenytoin (DILANTIN) 100 MG ER capsule TAKE 2 CAPSULES BY MOUTH AT BEDTIME (SEIZURE DISORDER)    . phenytoin (DILANTIN) 50 MG tablet Chew 50 mg by mouth at bedtime.    . triamcinolone ointment (KENALOG) 0.1 % Apply topically.    . warfarin (COUMADIN) 1 MG tablet TAKE 2 TABLETS (2MG) BY MOUTH ONCE DAILY (ALONG W/ 5MG=7MG TOTAL DOSAGE).    Marland Kitchen warfarin (COUMADIN) 5 MG tablet TAKE 1  TABLET BY MOUTH EVERY DAY (ALONG W/ 2MG=7MG TOTAL DOSAGE).     No current facility-administered medications for this visit.    Review of Systems:  GENERAL:  Tires easily.  No fevers or sweats.  Weight loss of 4 pounds since last visit. PERFORMANCE STATUS (ECOG):  3 HEENT:  No visual changes, runny nose, sore throat, mouth sores or tenderness. Lungs: No shortness of breath or cough.  No hemoptysis. Cardiac:  No chest pain, palpitations, orthopnea, or PND. GI:  No nausea, vomiting, diarrhea, constipation, melena or hematochezia. GU:  No urgency, frequency, dysuria, or hematuria. Musculoskeletal:  No back pain.  No joint pain.  No muscle tenderness. Extremities:  No pain or swelling. Skin:  No rashes or skin changes. Neuro:  Unsteady gait.  No seizures.  No headache, numbness or weakness, balance or coordination issues. Endocrine:   No diabetes, thyroid issues, hot flashes or night sweats. Psych:  No mood changes, depression or anxiety. Pain:  No focal pain. Review of systems:  All other systems reviewed and found to be negative.   Physical Exam: Blood pressure 142/84, pulse 81, temperature 98.8 F (37.1 C), temperature source Tympanic, resp. rate 18, height _0  (1.676 m), weight 187 lb 6.3 oz (85 kg). GENERAL:  Well developed, well nourished, woman sitting comfortably in a wheelchair in the exam room in no acute distress.   MENTAL STATUS:  Alert and oriented to person, place and time. HEAD:  Brown braided hair.  Flat facial expression.  Normocephalic, atraumatic, face symmetric, no Cushingoid features. EYES:  Small palpebral fissure.  Brown eyes.  Pupils equal round and reactive to light and accomodation.  No conjunctivitis or scleral icterus. ENT:  Oropharynx clear without lesion.  Dentures.  Tongue normal. Mucous membranes moist.  RESPIRATORY:  Clear to auscultation without rales, wheezes or rhonchi. CARDIOVASCULAR:  Regular rate and rhythm without murmur, rub or gallop. ABDOMEN:  Soft, non-tender, with active bowel sounds, and no appreciable hepatosplenomegaly.  No masses. SKIN:  Mild facial acne.  No rashes, ulcers or lesions. EXTREMITIES: Chronic lower extremity changes.  No skin discoloration or tenderness.  No palpable cords. LYMPH NODES: No palpable cervical, supraclavicular, axillary or inguinal adenopathy  PSYCH:  Appropriate.  Appointment on 02/26/2015  Component Date Value Ref Range Status  . WBC 02/26/2015 16.9* 3.6 - 11.0 K/uL Final  . RBC 02/26/2015 4.73  3.80 - 5.20 MIL/uL Final  . Hemoglobin 02/26/2015 10.2* 12.0 - 16.0 g/dL Final  . HCT 02/26/2015 32.3* 35.0 - 47.0 % Final  . MCV 02/26/2015 68.4* 80.0 - 100.0 fL Final  . MCH 02/26/2015 21.7* 26.0 - 34.0 pg Final  . MCHC 02/26/2015 31.7* 32.0 - 36.0 g/dL Final  . RDW 02/26/2015 16.7* 11.5 - 14.5 % Final  . Platelets 02/26/2015 211  150 - 440  K/uL Final  . Neutrophils Relative % 02/26/2015 31   Final  . Neutro Abs 02/26/2015 5.3  1.4 - 6.5 K/uL Final  . Lymphocytes Relative 02/26/2015 61   Final  . Lymphs Abs 02/26/2015 10.1* 1.0 - 3.6 K/uL Final  . Monocytes Relative 02/26/2015 7   Final  . Monocytes Absolute 02/26/2015 1.2* 0.2 - 0.9 K/uL Final  . Eosinophils Relative 02/26/2015 1   Final  . Eosinophils Absolute 02/26/2015 0.2  0 - 0.7 K/uL Final  . Basophils Relative 02/26/2015 0   Final  . Basophils Absolute 02/26/2015 0.1  0 - 0.1 K/uL Final  . Sodium 02/26/2015 136  135 - 145 mmol/L Final  .  Potassium 02/26/2015 4.0  3.5 - 5.1 mmol/L Final  . Chloride 02/26/2015 103  101 - 111 mmol/L Final  . CO2 02/26/2015 26  22 - 32 mmol/L Final  . Glucose, Bld 02/26/2015 94  65 - 99 mg/dL Final  . BUN 02/26/2015 13  6 - 20 mg/dL Final  . Creatinine, Ser 02/26/2015 0.66  0.44 - 1.00 mg/dL Final  . Calcium 02/26/2015 8.9  8.9 - 10.3 mg/dL Final  . GFR calc non Af Amer 02/26/2015 >60  >60 mL/min Final  . GFR calc Af Amer 02/26/2015 >60  >60 mL/min Final   Comment: (NOTE) The eGFR has been calculated using the CKD EPI equation. This calculation has not been validated in all clinical situations. eGFR's persistently <60 mL/min signify possible Chronic Kidney Disease.   . Anion gap 02/26/2015 7  5 - 15 Final    Assessment:  Jonell A Veals is a 55 y.o. female with  a history of lymphocytosis and neutrophilia dating back to at least 09/2006. Flow cytometry on 11/25/2008 revealed a low-grade B-cell lymphoma (CD20 positive, CD5 negative).  Phenotype did not suggest a specific histologic type and was not typical for CLL/SLL, mantle cell lymphoma, hairy cell leukemia or follicular center cell lymphoma. BCR-ABL testing was negative.    In association, she has had an IgM gammopathy. Monoclonal protein was 300 mg/dL in 11/2008 and then later 1.3 g/dL.  On 07/07/2014,  IgM was 2420 (high) with an IgG 1129 (normal).  SPEP with monoclonal  spike in the beta region of 1.6 gm/dL.  LDH was 133.  Uric acid 5.3.    She has had chronic microcytic anemia since 2008.  Hemoglobin has ranged between 10 and 11.  She has had negative stool guaiacs. B12 was normal.  Iron saturation was 19%. Hemoglobin electrophoresis revealed a normal hemoglobin F and A2 thus ruling out beta thalassemia. Colonoscopy in 05/2011 was normal.  She has a history of an abnormal thyroid noted on CT scan with abnormal PET uptake in 2010. Ultrasound guided biopsy on 06/24/2012 was negative.  She has a seizure disorder and a meningioma. Meningioma was 5 mm on 11/2008.  Head MRI on 07/14/2014 revealed small chronic subfrontal meningioma minimally increased in size from 5 mm to 8 mm since 2010.  There was asymmetric appearance of the mesial right temporal lobe including the right hippocampal formation.  Labs on 07/07/2014 revealed a hematocrit of 33.4, hemoglobin 10.0, MCV 69.2, platelets 275,000, WBC 22,800 with an ANC of 14,600. Absolute lymphocyte count (ALC) was 14,600.  Retic was 1.2% (low for level of anemia).  Ferritin was 34.  ESR was 20.  Ferritin goal is 100.  She has a history of bilateral DVT and pulmonary embolism in 2010. She developed a right lower extremity DVT on 07/09/2014 secondary to a sub-therapeutic INR.  She was also treated for a cellulitis.  She is on Coumadin 7 mg a day (managed by her PCP).   Symptomatically, she denies any complaints.  Exam reveals chronic lower extremity edema.  She has a stable microcytic anemia.  She is on oral iron.  Ferritin is 63.  Plan: 1.  Labs today:  CBC with diff, BMP, ferritin, SPEP, IgM, B12, folate. 2.  Discuss consideration of IV iron if unable to replete iron stores with oral iron. 3.  Nurse to call Elvia at care facility if IV iron needed. 4.  Anticipate bone marrow and/or imaging studies if IgM rising, increasing anemia, or symptoms. 5.  RTC in 3  months for MD assessment and labs (CBC with diff, CMP, ferritin,  IgM, SPEP).   Lequita Asal, MD  02/26/2015, 3:57 PM

## 2015-02-27 LAB — VITAMIN B12: Vitamin B-12: 524 pg/mL (ref 180–914)

## 2015-02-27 LAB — IGM: IgM, Serum: 2386 mg/dL — ABNORMAL HIGH (ref 26–217)

## 2015-03-01 ENCOUNTER — Encounter: Payer: Self-pay | Admitting: Hematology and Oncology

## 2015-03-01 LAB — PROTEIN ELECTROPHORESIS, SERUM
A/G Ratio: 0.9 (ref 0.7–1.7)
Albumin ELP: 4 g/dL (ref 2.9–4.4)
Alpha-1-Globulin: 0.2 g/dL (ref 0.0–0.4)
Alpha-2-Globulin: 0.8 g/dL (ref 0.4–1.0)
Beta Globulin: 2.7 g/dL — ABNORMAL HIGH (ref 0.7–1.3)
Gamma Globulin: 0.8 g/dL (ref 0.4–1.8)
Globulin, Total: 4.5 g/dL — ABNORMAL HIGH (ref 2.2–3.9)
M-Spike, %: 1.7 g/dL — ABNORMAL HIGH
Total Protein ELP: 8.5 g/dL (ref 6.0–8.5)

## 2015-03-31 DIAGNOSIS — C8510 Unspecified B-cell lymphoma, unspecified site: Secondary | ICD-10-CM | POA: Insufficient documentation

## 2015-03-31 DIAGNOSIS — D509 Iron deficiency anemia, unspecified: Secondary | ICD-10-CM | POA: Insufficient documentation

## 2015-05-27 ENCOUNTER — Other Ambulatory Visit: Payer: Self-pay | Admitting: *Deleted

## 2015-05-27 ENCOUNTER — Inpatient Hospital Stay (HOSPITAL_BASED_OUTPATIENT_CLINIC_OR_DEPARTMENT_OTHER): Payer: Medicare Other | Admitting: Hematology and Oncology

## 2015-05-27 ENCOUNTER — Inpatient Hospital Stay: Payer: Medicare Other | Attending: Hematology and Oncology

## 2015-05-27 VITALS — BP 129/95 | HR 63 | Temp 95.9°F | Resp 18 | Ht 66.0 in | Wt 188.2 lb

## 2015-05-27 DIAGNOSIS — Z8572 Personal history of non-Hodgkin lymphomas: Secondary | ICD-10-CM | POA: Insufficient documentation

## 2015-05-27 DIAGNOSIS — D509 Iron deficiency anemia, unspecified: Secondary | ICD-10-CM | POA: Insufficient documentation

## 2015-05-27 DIAGNOSIS — Z79899 Other long term (current) drug therapy: Secondary | ICD-10-CM | POA: Diagnosis not present

## 2015-05-27 DIAGNOSIS — C8510 Unspecified B-cell lymphoma, unspecified site: Secondary | ICD-10-CM

## 2015-05-27 DIAGNOSIS — D320 Benign neoplasm of cerebral meninges: Secondary | ICD-10-CM | POA: Diagnosis not present

## 2015-05-27 DIAGNOSIS — Z86711 Personal history of pulmonary embolism: Secondary | ICD-10-CM

## 2015-05-27 DIAGNOSIS — D329 Benign neoplasm of meninges, unspecified: Secondary | ICD-10-CM

## 2015-05-27 DIAGNOSIS — R600 Localized edema: Secondary | ICD-10-CM | POA: Insufficient documentation

## 2015-05-27 DIAGNOSIS — Z7901 Long term (current) use of anticoagulants: Secondary | ICD-10-CM | POA: Insufficient documentation

## 2015-05-27 DIAGNOSIS — R569 Unspecified convulsions: Secondary | ICD-10-CM | POA: Insufficient documentation

## 2015-05-27 DIAGNOSIS — D472 Monoclonal gammopathy: Secondary | ICD-10-CM | POA: Diagnosis not present

## 2015-05-27 DIAGNOSIS — Z86718 Personal history of other venous thrombosis and embolism: Secondary | ICD-10-CM | POA: Diagnosis not present

## 2015-05-27 LAB — CBC WITH DIFFERENTIAL/PLATELET
Basophils Absolute: 0.1 10*3/uL (ref 0–0.1)
Basophils Relative: 0 %
Eosinophils Absolute: 0.1 10*3/uL (ref 0–0.7)
Eosinophils Relative: 0 %
HCT: 33.8 % — ABNORMAL LOW (ref 35.0–47.0)
Hemoglobin: 10.3 g/dL — ABNORMAL LOW (ref 12.0–16.0)
Lymphocytes Relative: 71 %
Lymphs Abs: 16.3 10*3/uL — ABNORMAL HIGH (ref 1.0–3.6)
MCH: 21.2 pg — ABNORMAL LOW (ref 26.0–34.0)
MCHC: 30.4 g/dL — ABNORMAL LOW (ref 32.0–36.0)
MCV: 69.6 fL — ABNORMAL LOW (ref 80.0–100.0)
Monocytes Absolute: 1 10*3/uL — ABNORMAL HIGH (ref 0.2–0.9)
Monocytes Relative: 4 %
Neutro Abs: 5.9 10*3/uL (ref 1.4–6.5)
Neutrophils Relative %: 25 %
Platelets: 252 10*3/uL (ref 150–440)
RBC: 4.85 MIL/uL (ref 3.80–5.20)
RDW: 16.5 % — ABNORMAL HIGH (ref 11.5–14.5)
WBC: 23.4 10*3/uL — ABNORMAL HIGH (ref 3.6–11.0)

## 2015-05-27 LAB — COMPREHENSIVE METABOLIC PANEL
ALT: 15 U/L (ref 14–54)
AST: 19 U/L (ref 15–41)
Albumin: 3.8 g/dL (ref 3.5–5.0)
Alkaline Phosphatase: 107 U/L (ref 38–126)
Anion gap: 7 (ref 5–15)
BUN: 13 mg/dL (ref 6–20)
CO2: 28 mmol/L (ref 22–32)
Calcium: 9 mg/dL (ref 8.9–10.3)
Chloride: 104 mmol/L (ref 101–111)
Creatinine, Ser: 0.79 mg/dL (ref 0.44–1.00)
GFR calc Af Amer: >60 mL/min (ref >60)
GFR calc non Af Amer: >60 mL/min (ref >60)
Glucose, Bld: 83 mg/dL (ref 65–99)
Potassium: 3.9 mmol/L (ref 3.5–5.1)
Sodium: 139 mmol/L (ref 135–145)
Total Bilirubin: 0.3 mg/dL (ref 0.3–1.2)
Total Protein: 8.4 g/dL — ABNORMAL HIGH (ref 6.5–8.1)

## 2015-05-27 LAB — FERRITIN: Ferritin: 50 ng/mL (ref 11–307)

## 2015-05-27 NOTE — Progress Notes (Signed)
Pt reports no changes since last visit. Stays at [facility] for 4 years.

## 2015-05-27 NOTE — Progress Notes (Signed)
[facility] day:  05/27/2015   Chief Complaint: Laurie Rice is a 55 y.o. female with a low grade B cell lymphoma, iron deficiency anemia, and meningioma who is seen for 3 month assessment.  HPI: The patient was last seen in the medical oncology clinic on 02/26/2015.  At that time,  she denied any complaints.  Exam revealedd chronic lower extremity edema.  She had a stable microcytic anemia.  She was on oral iron.  Ferritin was 63, folate 15, and B12 524.  SPEP revealed a 1.7 gm/dL monoclonal spike (1.6 gm/dL previously).  During the interim, she has done well.  She voices no complaints.  She feels "alright".  She states that she is taking her vitamin every day.  She is taking her oral iron.  She is eating well.  Her caretaker/driver states that they eat a lot of chicken.   Past Medical History  Diagnosis Date  . Seizures ([place])   . Mild mental retardation   . Meningioma ([place])   . 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 Personal history of pulmonary embolism   . History of lymphocytosis   . Monoclonal gammopathy     low level  . Anemia     chronic, mcv chronically low  . Cellulitis     History of  . Impetigo     History of  . Liver masses     History of, Benign  . Thyroid mass     history of    Past Surgical History  Procedure Laterality Date  . No past surgeries      Family History  Problem Relation Age of Onset  . Hypertension    . Arthritis      Social History:  reports that she has never smoked. She does not have any smokeless tobacco history on file. She reports that she does not drink alcohol or use illicit drugs.  The patient is accompanied by Verdie Drown, daughter of patient's caregiver, from family care at [facility] family home.  Per the patient, 6 women live at the [facility] home.  Contact person is Alvia [phone]).  The patient spends her days watching TV.  She can dress on her own.  She  needs help with showers.  She likes to fold towels and clean the bird cage.  She is accompanied by Lennette Bihari from the [facility] home.  Allergies: No Known Allergies  Current Medications: Current Outpatient Prescriptions  Medication Sig Dispense Refill  . Emollient (CETAPHIL) cream APPLY SMALL AMOUNT TO AFFECTED AREAS ONCE DAILY    . ferrous sulfate 325 (65 FE) MG EC tablet Take 1 tablet (325 mg total) by mouth daily with breakfast. With orange juice 30 tablet 3  . PHENobarbital (LUMINAL) 32.4 MG tablet Take 64.8 mg by mouth at bedtime.     . phenytoin (DILANTIN) 100 MG ER capsule TAKE 2 CAPSULES BY MOUTH AT BEDTIME (SEIZURE DISORDER)    . phenytoin (DILANTIN) 50 MG tablet Chew 50 mg by mouth at bedtime.    . triamcinolone ointment (KENALOG) 0.1 % Apply topically.    . warfarin (COUMADIN) 1 MG tablet TAKE 2 TABLETS (2MG) BY MOUTH ONCE DAILY (ALONG W/ 5MG=7MG TOTAL DOSAGE).    Marland Kitchen warfarin (COUMADIN) 5 MG tablet TAKE 1 TABLET BY MOUTH EVERY DAY (ALONG W/ 2MG=7MG TOTAL DOSAGE).     No current facility-administered medications for this visit.    Review of Systems:  GENERAL:  Feels "alright".  No fevers or sweats.  Weight gain of 1 pound since last visit. PERFORMANCE STATUS (ECOG):  3 HEENT:  No visual changes, runny nose, sore throat, mouth sores or tenderness. Lungs: No shortness of breath or cough.  No hemoptysis. Cardiac:  No chest pain, palpitations, orthopnea, or PND. GI:  No nausea, vomiting, diarrhea, constipation, melena or hematochezia. GU:  No urgency, frequency, dysuria, or hematuria. Musculoskeletal:  No back pain.  No joint pain.  No muscle tenderness. Extremities:  No pain or swelling. Skin:  No rashes or skin changes. Neuro:  Unsteady gait.  No seizures.  No headache, numbness or weakness, balance or coordination issues. Endocrine:  No diabetes, thyroid issues, hot flashes or night sweats. Psych:  No mood changes, depression or anxiety. Pain:  No focal pain. Review of systems:   All other systems reviewed and found to be negative.   Physical Exam: Blood pressure 129/95, pulse 63, temperature 95.9 F (35.5 C), temperature source Tympanic, resp. rate 18, height '5\' 6"'  (1.676 m), weight 188 lb 2.6 oz (85.35 kg). GENERAL:  Well developed, well nourished, woman sitting comfortably in the exam room in no acute distress.   MENTAL STATUS:  Alert and oriented to person, place and time. HEAD:  Brown braided hair.  Flat facial expression.  Normocephalic, atraumatic, face symmetric, no Cushingoid features. EYES:  Small palpebral fissure.  Brown eyes.  Pupils equal round and reactive to light and accomodation.  No conjunctivitis or scleral icterus. ENT:  Oropharynx clear without lesion.  Dentures.  Tongue normal. Mucous membranes moist.  RESPIRATORY:  Clear to auscultation without rales, wheezes or rhonchi. CARDIOVASCULAR:  Regular rate and rhythm without murmur, rub or gallop. ABDOMEN:  Soft, non-tender, with active bowel sounds, and no appreciable hepatosplenomegaly.  No masses. SKIN:  Mild facial acne.  No rashes, ulcers or lesions. EXTREMITIES: Chronic lower extremity changes.  No skin discoloration or tenderness.  No palpable cords. LYMPH NODES: No palpable cervical, supraclavicular, axillary or inguinal adenopathy  PSYCH:  Appropriate.  Appointment on 05/27/2015  Component Date Value Ref Range Status  . WBC 05/27/2015 23.4* 3.6 - 11.0 K/uL Final  . RBC 05/27/2015 4.85  3.80 - 5.20 MIL/uL Final  . Hemoglobin 05/27/2015 10.3* 12.0 - 16.0 g/dL Final  . HCT 05/27/2015 33.8* 35.0 - 47.0 % Final  . MCV 05/27/2015 69.6* 80.0 - 100.0 fL Final  . MCH 05/27/2015 21.2* 26.0 - 34.0 pg Final  . MCHC 05/27/2015 30.4* 32.0 - 36.0 g/dL Final  . RDW 05/27/2015 16.5* 11.5 - 14.5 % Final  . Platelets 05/27/2015 252  150 - 440 K/uL Final  . Neutrophils Relative % 05/27/2015 25   Final  . Neutro Abs 05/27/2015 5.9  1.4 - 6.5 K/uL Final  . Lymphocytes Relative 05/27/2015 71   Final  .  Lymphs Abs 05/27/2015 16.3* 1.0 - 3.6 K/uL Final  . Monocytes Relative 05/27/2015 4   Final  . Monocytes Absolute 05/27/2015 1.0* 0.2 - 0.9 K/uL Final  . Eosinophils Relative 05/27/2015 0   Final  . Eosinophils Absolute 05/27/2015 0.1  0 - 0.7 K/uL Final  . Basophils Relative 05/27/2015 0   Final  . Basophils Absolute 05/27/2015 0.1  0 - 0.1 K/uL Final  . Sodium 05/27/2015 139  135 - 145 mmol/L Final  . Potassium 05/27/2015 3.9  3.5 - 5.1 mmol/L Final  . Chloride 05/27/2015 104  101 - 111 mmol/L Final  . CO2 05/27/2015 28  22 - 32 mmol/L Final  .  Glucose, Bld 05/27/2015 83  65 - 99 mg/dL Final  . BUN 05/27/2015 13  6 - 20 mg/dL Final  . Creatinine, Ser 05/27/2015 0.79  0.44 - 1.00 mg/dL Final  . Calcium 05/27/2015 9.0  8.9 - 10.3 mg/dL Final  . Total Protein 05/27/2015 8.4* 6.5 - 8.1 g/dL Final  . Albumin 05/27/2015 3.8  3.5 - 5.0 g/dL Final  . AST 05/27/2015 19  15 - 41 U/L Final  . ALT 05/27/2015 15  14 - 54 U/L Final  . Alkaline Phosphatase 05/27/2015 107  38 - 126 U/L Final  . Total Bilirubin 05/27/2015 0.3  0.3 - 1.2 mg/dL Final  . GFR calc non Af Amer 05/27/2015 >60  >60 mL/min Final  . GFR calc Af Amer 05/27/2015 >60  >60 mL/min Final   Comment: (NOTE) The eGFR has been calculated using the CKD EPI equation. This calculation has not been validated in all clinical situations. eGFR's persistently <60 mL/min signify possible Chronic Kidney Disease.   . Anion gap 05/27/2015 7  5 - 15 Final    Assessment:  Laurie Rice is a 55 y.o. female with  a history of lymphocytosis and neutrophilia dating back to at least 09/2006. Flow cytometry on 11/25/2008 revealed a low-grade B-cell lymphoma (CD20 positive, CD5 negative).  Phenotype did not suggest a specific histologic type and was not typical for CLL/SLL, mantle cell lymphoma, hairy cell leukemia or follicular center cell lymphoma. BCR-ABL testing was negative.    In association, she has had an IgM gammopathy. Monoclonal  protein was 300 mg/dL in 11/2008 and then later 1.3 g/dL.  On 07/07/2014,  IgM was 2420 (high) with an IgG 1129 (normal).  SPEP with monoclonal spike in the beta region of 1.6 gm/dL- stable in the past 6 months.  LDH was 133.  Uric acid 5.3.    She has had chronic microcytic anemia since 2008.  Hemoglobin has ranged between 10 and 11.  She has had negative stool guaiacs. B12 was normal.  Iron saturation was 19%. Hemoglobin electrophoresis revealed a normal hemoglobin F and A2 thus ruling out beta thalassemia. Colonoscopy in 05/2011 was normal.  She has a history of an abnormal thyroid noted on CT scan with abnormal PET uptake in 2010. Ultrasound guided biopsy on 06/24/2012 was negative.  She has a seizure disorder and a meningioma. Meningioma was 5 mm on 11/2008.  Head MRI on 07/14/2014 revealed small chronic subfrontal meningioma minimally increased in size from 5 mm to 8 mm since 2010.  There was asymmetric appearance of the mesial right temporal lobe including the right hippocampal formation.  Labs on 07/07/2014 revealed a hematocrit of 33.4, hemoglobin 10.0, MCV 69.2, platelets 275,000, WBC 22,800 with an ANC of 14,600. Absolute lymphocyte count (ALC) was 14,600.  Retic was 1.2% (low for level of anemia).  Ferritin was 34.  ESR was 20.  Ferritin goal is 100.  She has a history of bilateral DVT and pulmonary embolism in 2010. She developed a right lower extremity DVT on 07/09/2014 secondary to a sub-therapeutic INR.  She was also treated for a cellulitis.  She is on Coumadin 7 mg a day (managed by her PCP).   Symptomatically, she denies any complaints.  Exam reveals chronic lower extremity edema.  She has a stable microcytic anemia.  She is on oral iron.  Ferritin is 50.  SPEP reveals an M-spike of 1.6 gm/dL (stable).  Plan: 1.  Labs today:  CBC with diff, BMP, ferritin, SPEP, IgM. 2.  Continue oral iron.  Ferritin goal 100. 3.  Complete paperwork for [place] [employer] and [employer]. 4.   Anticipate bone marrow and/or imaging studies if IgM rising, increasing anemia, or symptoms. 5.  RTC in 3 months for MD assessment and labs (CBC with diff, CMP, ferritin, IgM, SPEP, hemoglobin electrophoresis).   Lequita Asal, MD  05/27/2015, 3:33 PM

## 2015-05-28 LAB — PROTEIN ELECTROPHORESIS, SERUM
A/G Ratio: 0.8 (ref 0.7–1.7)
Albumin ELP: 3.5 g/dL (ref 2.9–4.4)
Alpha-1-Globulin: 0.3 g/dL (ref 0.0–0.4)
Alpha-2-Globulin: 0.8 g/dL (ref 0.4–1.0)
Beta Globulin: 0.7 g/dL (ref 0.7–1.3)
Gamma Globulin: 2.6 g/dL — ABNORMAL HIGH (ref 0.4–1.8)
Globulin, Total: 4.4 g/dL — ABNORMAL HIGH (ref 2.2–3.9)
M-Spike, %: 1.6 g/dL — ABNORMAL HIGH
Total Protein ELP: 7.9 g/dL (ref 6.0–8.5)

## 2015-05-28 LAB — IGM: IgM, Serum: 2624 mg/dL — ABNORMAL HIGH (ref 26–217)

## 2015-05-29 ENCOUNTER — Encounter: Payer: Self-pay | Admitting: Hematology and Oncology

## 2015-06-01 ENCOUNTER — Other Ambulatory Visit: Payer: Self-pay | Admitting: *Deleted

## 2015-06-01 DIAGNOSIS — C8510 Unspecified B-cell lymphoma, unspecified site: Secondary | ICD-10-CM

## 2015-08-25 ENCOUNTER — Other Ambulatory Visit: Payer: Self-pay | Admitting: Family Medicine

## 2015-08-25 DIAGNOSIS — Z1231 Encounter for screening mammogram for malignant neoplasm of breast: Secondary | ICD-10-CM

## 2015-09-06 ENCOUNTER — Other Ambulatory Visit: Payer: Self-pay | Admitting: Hematology and Oncology

## 2015-09-06 DIAGNOSIS — C8510 Unspecified B-cell lymphoma, unspecified site: Secondary | ICD-10-CM

## 2015-09-06 DIAGNOSIS — D509 Iron deficiency anemia, unspecified: Secondary | ICD-10-CM

## 2015-09-06 DIAGNOSIS — D329 Benign neoplasm of meninges, unspecified: Secondary | ICD-10-CM

## 2015-09-06 DIAGNOSIS — D472 Monoclonal gammopathy: Secondary | ICD-10-CM

## 2015-09-06 NOTE — Progress Notes (Signed)
Laurie Rice day:  09/07/15   Chief Complaint: Laurie Rice is a 55 y.o. female with a low grade B cell lymphoma, iron deficiency anemia, and meningioma who is seen for 3 month assessment.  HPI: The patient was last seen in the medical oncology [facility] on 05/27/2015.  At that time,  she denied any complaints.  Exam revealed chronic lower extremity edema.  She had a stable microcytic anemia.  She was on oral iron.  Ferritin was 50.  SPEP revealed an M-spike of 1.6 gm/dL (stable).  During the interim, she has had no problems.  She has had waxing and waning lower extremity swelling.  Caregivers also feel the left side of her face is slightly discolored.  She has had a weight loss of 8 pounds in the past 10 months.  She is eating well.  She denies any fevers or sweats.  She denies any adenopathy, bruising or bleeding.   Past Medical History:  Diagnosis Date  . Anemia    chronic, mcv chronically low  . Cellulitis    History of  . 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 History of lymphocytosis   . Impetigo    History of  . Liver masses    History of, Benign  . Meningioma ([place])   . Mild mental retardation   . Monoclonal gammopathy    low level  . Personal history of pulmonary embolism   . Seizures ([place])   . Thyroid mass    history of    Past Surgical History:  Procedure Laterality Date  . NO PAST SURGERIES      Family History  Problem Relation Age of Onset  . Hypertension    . Arthritis      Social History:  reports that she has never smoked. She does not have any smokeless tobacco history on file. She reports that she does not drink alcohol or use drugs.  The patient is accompanied by Laurie Rice, daughter of patient's caregiver, from family care at [facility] family home.  Per the patient, 6 women live at the [place] home.  Contact person is Laurie Rice [phone]).  The patient spends her days watching TV.  She  can dress on her own.  She needs help with showers.  She likes to fold towels and clean the bird cage.  She is accompanied by 2 people from the [place] home.  Allergies: No Known Allergies  Current Medications: Current Outpatient Prescriptions  Medication Sig Dispense Refill  . Emollient (CETAPHIL) cream APPLY SMALL AMOUNT TO AFFECTED AREAS ONCE DAILY    . ferrous sulfate 325 (65 FE) MG EC tablet Take 1 tablet (325 mg total) by mouth daily with breakfast. With orange juice 30 tablet 3  . PHENobarbital (LUMINAL) 32.4 MG tablet Take 64.8 mg by mouth at bedtime.     . phenytoin (DILANTIN) 100 MG ER capsule TAKE 2 CAPSULES BY MOUTH AT BEDTIME (SEIZURE DISORDER)    . phenytoin (DILANTIN) 50 MG tablet Chew 50 mg by mouth at bedtime.    Marland Kitchen warfarin (COUMADIN) 1 MG tablet TAKE 2 TABLETS (2MG) BY MOUTH ONCE DAILY (ALONG W/ 5MG=7MG TOTAL DOSAGE).    Marland Kitchen warfarin (COUMADIN) 5 MG tablet TAKE 1 TABLET BY MOUTH EVERY DAY (ALONG W/ 2MG=7MG TOTAL DOSAGE).     No current facility-administered medications for this visit.     Review of Systems:  GENERAL:  Feels "fine".  No fevers or  sweats.  Weight down 5 pounds since last visit. PERFORMANCE STATUS (ECOG):  3 HEENT:  No visual changes, runny nose, sore throat, mouth sores or tenderness. Lungs: No shortness of breath or cough.  No hemoptysis. Cardiac:  No chest pain, palpitations, orthopnea, or PND. GI:  No nausea, vomiting, diarrhea, constipation, melena or hematochezia. GU:  No urgency, frequency, dysuria, or hematuria. Musculoskeletal:  No back pain.  No joint pain.  No muscle tenderness. Extremities:  No pain or swelling. Skin:  Left side of face discolored.  No rashes or skin changes. Neuro:  Unsteady gait.  No seizures.  No headache, numbness or weakness, balance or coordination issues. Endocrine:  No diabetes, thyroid issues, hot flashes or night sweats. Psych:  No mood changes, depression or anxiety. Pain:  No focal pain. Review of systems:  All  other systems reviewed and found to be negative.   Physical Exam: Blood pressure (!) 153/84, pulse 68, resp. rate 18, weight 183 lb 6.8 oz (83.2 kg). GENERAL:  Well developed, well nourished, woman sitting comfortably in the exam room in no acute distress.   MENTAL STATUS:  Alert and oriented to person, place and time. HEAD:  Brown braided hair.  Flat facial expression.  Normocephalic, atraumatic, face symmetric, no Cushingoid features. EYES:  Small palpebral fissure.  Brown eyes.  Pupils equal round and reactive to light and accomodation.  No conjunctivitis or scleral icterus. ENT:  Oropharynx clear without lesion.  Dentures.  Tongue normal. Mucous membranes moist.  RESPIRATORY:  Clear to auscultation without rales, wheezes or rhonchi. CARDIOVASCULAR:  Regular rate and rhythm without murmur, rub or gallop. ABDOMEN:  Soft, non-tender, with active bowel sounds, and no appreciable hepatosplenomegaly.  No masses. SKIN:  Mild facial acne.  Slight pigmentation left side of face.  No rashes, ulcers or lesions. EXTREMITIES: Chronic 2+ lower extremity changes (no change).  No skin discoloration or tenderness.  No palpable cords. LYMPH NODES: No palpable cervical, supraclavicular, axillary or inguinal adenopathy  NEUROLOGICAL:  Slow.  Appropriate. PSYCH:  Appropriate.   Appointment on 09/07/2015  Component Date Value Ref Range Status  . WBC 09/07/2015 18.7* 3.6 - 11.0 K/uL Final  . RBC 09/07/2015 4.75  3.80 - 5.20 MIL/uL Final  . Hemoglobin 09/07/2015 10.2* 12.0 - 16.0 g/dL Final  . HCT 09/07/2015 32.7* 35.0 - 47.0 % Final  . MCV 09/07/2015 68.9* 80.0 - 100.0 fL Final  . MCH 09/07/2015 21.4* 26.0 - 34.0 pg Final  . MCHC 09/07/2015 31.1* 32.0 - 36.0 g/dL Final  . RDW 09/07/2015 16.6* 11.5 - 14.5 % Final  . Platelets 09/07/2015 231  150 - 440 K/uL Final  . Neutrophils Relative % 09/07/2015 29  % Final  . Neutro Abs 09/07/2015 5.4  1.4 - 6.5 K/uL Final  . Lymphocytes Relative 09/07/2015 66  %  Final  . Lymphs Abs 09/07/2015 12.4* 1.0 - 3.6 K/uL Final  . Monocytes Relative 09/07/2015 4  % Final  . Monocytes Absolute 09/07/2015 0.8  0.2 - 0.9 K/uL Final  . Eosinophils Relative 09/07/2015 1  % Final  . Eosinophils Absolute 09/07/2015 0.1  0 - 0.7 K/uL Final  . Basophils Relative 09/07/2015 0  % Final  . Basophils Absolute 09/07/2015 0.0  0 - 0.1 K/uL Final  . Sodium 09/07/2015 138  135 - 145 mmol/L Final  . Potassium 09/07/2015 4.1  3.5 - 5.1 mmol/L Final  . Chloride 09/07/2015 105  101 - 111 mmol/L Final  . CO2 09/07/2015 26  22 - 32  mmol/L Final  . Glucose, Bld 09/07/2015 69  65 - 99 mg/dL Final  . BUN 09/07/2015 13  6 - 20 mg/dL Final  . Creatinine, Ser 09/07/2015 0.61  0.44 - 1.00 mg/dL Final  . Calcium 09/07/2015 9.0  8.9 - 10.3 mg/dL Final  . Total Protein 09/07/2015 8.4* 6.5 - 8.1 g/dL Final  . Albumin 09/07/2015 3.7  3.5 - 5.0 g/dL Final  . AST 09/07/2015 18  15 - 41 U/L Final  . ALT 09/07/2015 14  14 - 54 U/L Final  . Alkaline Phosphatase 09/07/2015 101  38 - 126 U/L Final  . Total Bilirubin 09/07/2015 0.4  0.3 - 1.2 mg/dL Final  . GFR calc non Af Amer 09/07/2015 >60  >60 mL/min Final  . GFR calc Af Amer 09/07/2015 >60  >60 mL/min Final   Comment: (NOTE) The eGFR has been calculated using the CKD EPI equation. This calculation has not been validated in all clinical situations. eGFR's persistently <60 mL/min signify possible Chronic Kidney Disease.   . Anion gap 09/07/2015 7  5 - 15 Final  . LDH 09/07/2015 140  98 - 192 U/L Final    Assessment:  Laurie Rice is a 55 y.o. female with  a history of lymphocytosis and neutrophilia dating back to at least 09/2006. Flow cytometry on 11/25/2008 revealed a low-grade B-cell lymphoma (CD20 positive, CD5 negative).  Phenotype did not suggest a specific histologic type and was not typical for CLL/SLL, mantle cell lymphoma, hairy cell leukemia or follicular center cell lymphoma. BCR-ABL testing was negative.    In  association, she has an IgM gammopathy.  Monoclonal protein was 300 mg/dL in 11/2008 and then later 1.3 g/dL.  On 07/07/2014,  IgM was 2420 (high) with an IgG 1129 (normal).  SPEP reveals a monoclonal spike in the beta region of 1.6 gm/dL- stable in the past 6 months.  LDH was 133.  Uric acid 5.3.    IgM is as follows: 7829 on 07/07/2014, 2510 on 11/09/2014, 2624 on 05/27/2015, and 2962 on 09/07/2015.  SPEP is as follows: 1.6 gm/dL on 07/07/2014, 1.6 gm/dL on 11/09/2014, 1.6 on 05/27/2015, and 1.7 on 09/07/2015.   She has had chronic microcytic anemia since 2008.  Hemoglobin has ranged between 10 and 11.  She has had negative stool guaiacs. B12 was normal.  Iron saturation was 19%. Hemoglobin electrophoresis revealed a normal hemoglobin F and A2 thus ruling out beta thalassemia. Colonoscopy in 05/2011 was normal.  She has a history of an abnormal thyroid noted on CT scan with abnormal PET uptake in 2010. Ultrasound guided biopsy on 06/24/2012 was negative.  She has a seizure disorder and a meningioma. Meningioma was 5 mm on 11/2008.  Head MRI on 07/14/2014 revealed small chronic subfrontal meningioma minimally increased in size from 5 mm to 8 mm since 2010.  There was asymmetric appearance of the mesial right temporal lobe including the right hippocampal formation.  Labs on 07/07/2014 revealed a hematocrit of 33.4, hemoglobin 10.0, MCV 69.2, platelets 275,000, WBC 22,800 with an ANC of 14,600. Absolute lymphocyte count (ALC) was 14,600.  Retic was 1.2% (low for level of anemia).  Ferritin was 34.  ESR was 20.  Ferritin goal is 100.  She has a history of bilateral DVT and pulmonary embolism in 2010. She developed a right lower extremity DVT on 07/09/2014 secondary to a sub-therapeutic INR.  She was also treated for a cellulitis.  She is on Coumadin 7 mg a day (managed by her PCP).  Symptomatically, she feels fine.  Exam reveals chronic lower extremity edema.  She has lost 8 pounds in 10 months.  She  has a stable microcytic anemia.  She is on oral iron.  Ferritin is 62.  SPEP reveals an M-spike of 1.7 gm/dL.  IgM is slowly creeping up.  Plan: 1.  Labs today:  CBC with diff, CMP, ferritin, SPEP, IgM, hemoglobin electrophoresis. 2.  Continue oral iron.  Ferritin goal 100. 3.  RTC in 3 months for MD assessment and labs (CBC with diff, CMP, ferritin, IgM, SPEP).   Lequita Asal, MD  09/07/2015, 10:56 AM

## 2015-09-07 ENCOUNTER — Inpatient Hospital Stay (HOSPITAL_BASED_OUTPATIENT_CLINIC_OR_DEPARTMENT_OTHER): Payer: Medicare Other | Admitting: Hematology and Oncology

## 2015-09-07 ENCOUNTER — Other Ambulatory Visit: Payer: Self-pay | Admitting: *Deleted

## 2015-09-07 ENCOUNTER — Inpatient Hospital Stay: Payer: Medicare Other | Attending: Hematology and Oncology

## 2015-09-07 ENCOUNTER — Encounter: Payer: Self-pay | Admitting: Hematology and Oncology

## 2015-09-07 VITALS — BP 153/84 | HR 68 | Resp 18 | Wt 183.4 lb

## 2015-09-07 DIAGNOSIS — Z7901 Long term (current) use of anticoagulants: Secondary | ICD-10-CM | POA: Insufficient documentation

## 2015-09-07 DIAGNOSIS — Z8572 Personal history of non-Hodgkin lymphomas: Secondary | ICD-10-CM | POA: Insufficient documentation

## 2015-09-07 DIAGNOSIS — Z86718 Personal history of other venous thrombosis and embolism: Secondary | ICD-10-CM | POA: Diagnosis not present

## 2015-09-07 DIAGNOSIS — D472 Monoclonal gammopathy: Secondary | ICD-10-CM | POA: Diagnosis not present

## 2015-09-07 DIAGNOSIS — Z86711 Personal history of pulmonary embolism: Secondary | ICD-10-CM

## 2015-09-07 DIAGNOSIS — C8510 Unspecified B-cell lymphoma, unspecified site: Secondary | ICD-10-CM

## 2015-09-07 DIAGNOSIS — D509 Iron deficiency anemia, unspecified: Secondary | ICD-10-CM | POA: Diagnosis not present

## 2015-09-07 DIAGNOSIS — D329 Benign neoplasm of meninges, unspecified: Secondary | ICD-10-CM

## 2015-09-07 LAB — COMPREHENSIVE METABOLIC PANEL
ALT: 14 U/L (ref 14–54)
AST: 18 U/L (ref 15–41)
Albumin: 3.7 g/dL (ref 3.5–5.0)
Alkaline Phosphatase: 101 U/L (ref 38–126)
Anion gap: 7 (ref 5–15)
BUN: 13 mg/dL (ref 6–20)
CO2: 26 mmol/L (ref 22–32)
Calcium: 9 mg/dL (ref 8.9–10.3)
Chloride: 105 mmol/L (ref 101–111)
Creatinine, Ser: 0.61 mg/dL (ref 0.44–1.00)
GFR calc Af Amer: >60 mL/min (ref >60)
GFR calc non Af Amer: >60 mL/min (ref >60)
Glucose, Bld: 69 mg/dL (ref 65–99)
Potassium: 4.1 mmol/L (ref 3.5–5.1)
Sodium: 138 mmol/L (ref 135–145)
Total Bilirubin: 0.4 mg/dL (ref 0.3–1.2)
Total Protein: 8.4 g/dL — ABNORMAL HIGH (ref 6.5–8.1)

## 2015-09-07 LAB — CBC WITH DIFFERENTIAL/PLATELET
Basophils Absolute: 0 10*3/uL (ref 0–0.1)
Basophils Relative: 0 %
Eosinophils Absolute: 0.1 10*3/uL (ref 0–0.7)
Eosinophils Relative: 1 %
HCT: 32.7 % — ABNORMAL LOW (ref 35.0–47.0)
Hemoglobin: 10.2 g/dL — ABNORMAL LOW (ref 12.0–16.0)
Lymphocytes Relative: 66 %
Lymphs Abs: 12.4 10*3/uL — ABNORMAL HIGH (ref 1.0–3.6)
MCH: 21.4 pg — ABNORMAL LOW (ref 26.0–34.0)
MCHC: 31.1 g/dL — ABNORMAL LOW (ref 32.0–36.0)
MCV: 68.9 fL — ABNORMAL LOW (ref 80.0–100.0)
Monocytes Absolute: 0.8 10*3/uL (ref 0.2–0.9)
Monocytes Relative: 4 %
Neutro Abs: 5.4 10*3/uL (ref 1.4–6.5)
Neutrophils Relative %: 29 %
Platelets: 231 10*3/uL (ref 150–440)
RBC: 4.75 MIL/uL (ref 3.80–5.20)
RDW: 16.6 % — ABNORMAL HIGH (ref 11.5–14.5)
WBC: 18.7 10*3/uL — ABNORMAL HIGH (ref 3.6–11.0)

## 2015-09-07 LAB — FERRITIN: Ferritin: 62 ng/mL (ref 11–307)

## 2015-09-07 LAB — LACTATE DEHYDROGENASE: LDH: 140 U/L (ref 98–192)

## 2015-09-07 NOTE — Progress Notes (Signed)
Patient accompanied by 2 of her caretakers today.  One of which is concerned about discoloration on the left side of patient's face.  Also bilateral lower extremity edema - more pronounced on the right side.  Worsened over past couple of weeks.  Also states patient has lost 16 pounds in a year without trying.

## 2015-09-08 LAB — PROTEIN ELECTROPHORESIS, SERUM
A/G Ratio: 1 (ref 0.7–1.7)
Albumin ELP: 3.9 g/dL (ref 2.9–4.4)
Alpha-1-Globulin: 0.2 g/dL (ref 0.0–0.4)
Alpha-2-Globulin: 0.7 g/dL (ref 0.4–1.0)
Beta Globulin: 2.6 g/dL — ABNORMAL HIGH (ref 0.7–1.3)
Gamma Globulin: 0.7 g/dL (ref 0.4–1.8)
Globulin, Total: 4.1 g/dL — ABNORMAL HIGH (ref 2.2–3.9)
M-Spike, %: 1.7 g/dL — ABNORMAL HIGH
Total Protein ELP: 8 g/dL (ref 6.0–8.5)

## 2015-09-09 LAB — HEMOGLOBINOPATHY EVALUATION
Hgb A2 Quant: 2.1 % (ref 0.7–3.1)
Hgb A: 97.9 % (ref 94.0–98.0)
Hgb C: 0 %
Hgb F Quant: 0 % (ref 0.0–2.0)
Hgb S Quant: 0 %

## 2015-09-09 LAB — IGM: IgM, Serum: 2962 mg/dL — ABNORMAL HIGH (ref 26–217)

## 2015-09-27 ENCOUNTER — Ambulatory Visit
Admission: RE | Admit: 2015-09-27 | Discharge: 2015-09-27 | Disposition: A | Discharge status: Home / Self Care | Payer: Medicare Other | Source: Ambulatory Visit | Attending: Family Medicine | Admitting: Family Medicine

## 2015-09-27 ENCOUNTER — Other Ambulatory Visit: Payer: Self-pay | Admitting: Family Medicine

## 2015-09-27 DIAGNOSIS — Z1231 Encounter for screening mammogram for malignant neoplasm of breast: Secondary | ICD-10-CM

## 2015-10-02 ENCOUNTER — Encounter: Payer: Self-pay | Admitting: Hematology and Oncology

## 2015-12-07 ENCOUNTER — Inpatient Hospital Stay: Payer: Medicare Other | Admitting: Hematology and Oncology

## 2015-12-07 ENCOUNTER — Inpatient Hospital Stay: Payer: Medicare Other | Attending: Hematology and Oncology

## 2015-12-07 NOTE — Progress Notes (Deleted)
[facility] day:  12/07/15   Chief Complaint: Laurie Rice is a 55 y.o. female with a low grade B cell lymphoma, iron deficiency anemia, and meningioma who is seen for 3 month assessment.  HPI: The patient was last seen in the medical oncology clinic on 09/07/2015.  At that time,  she felt fine.  Exam revealed chronic lower extremity edema.  She had lost 8 pounds in 10 months.  She had a stable microcytic anemia.  She was on oral iron.  Ferritin was 62.  SPEP revealed an M-spike of 1.7 gm/dL.  IgM was slowly creeping up.  Hemoglobin electrophoresis was normal (97.9% Hgb A, 2.1% Hgb A2).    During the interim, she has had no problems.  She has had waxing and waning lower extremity swelling.  Caregivers also feel the left side of her face is slightly discolored.  She has had a weight loss of 8 pounds in the past 10 months.  She is eating well.  She denies any fevers or sweats.  She denies any adenopathy, bruising or bleeding.   Past Medical History:  Diagnosis Date  . Anemia    chronic, mcv chronically low  . Cellulitis    History of  . 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 History of lymphocytosis   . Impetigo    History of  . Liver masses    History of, Benign  . Meningioma ([place])   . Mild mental retardation   . Monoclonal gammopathy    low level  . Personal history of pulmonary embolism   . Seizures ([place])   . Thyroid mass    history of    Past Surgical History:  Procedure Laterality Date  . NO PAST SURGERIES      Family History  Problem Relation Age of Onset  . Hypertension    . Arthritis      Social History:  reports that she has never smoked. She does not have any smokeless tobacco history on file. She reports that she does not drink alcohol or use drugs.  The patient is accompanied by Verdie Drown, daughter of patient's caregiver, from family care at [facility] family home.  Per the patient, 6  women live at the [place] home.  Contact person is Alvia [phone]).  The patient spends her days watching TV.  She can dress on her own.  She needs help with showers.  She likes to fold towels and clean the bird cage.  She is accompanied by 2 people from the [place] home.  Allergies: No Known Allergies  Current Medications: Current Outpatient Prescriptions  Medication Sig Dispense Refill  . Emollient (CETAPHIL) cream APPLY SMALL AMOUNT TO AFFECTED AREAS ONCE DAILY    . ferrous sulfate 325 (65 FE) MG EC tablet Take 1 tablet (325 mg total) by mouth daily with breakfast. With orange juice 30 tablet 3  . PHENobarbital (LUMINAL) 32.4 MG tablet Take 64.8 mg by mouth at bedtime.     . phenytoin (DILANTIN) 100 MG ER capsule TAKE 2 CAPSULES BY MOUTH AT BEDTIME (SEIZURE DISORDER)    . phenytoin (DILANTIN) 50 MG tablet Chew 50 mg by mouth at bedtime.    Marland Kitchen warfarin (COUMADIN) 1 MG tablet TAKE 2 TABLETS (2MG) BY MOUTH ONCE DAILY (ALONG W/ 5MG=7MG TOTAL DOSAGE).    Marland Kitchen warfarin (COUMADIN) 5 MG tablet TAKE 1 TABLET BY MOUTH EVERY DAY (ALONG W/ 2MG=7MG TOTAL DOSAGE).  No current facility-administered medications for this visit.     Review of Systems:  GENERAL:  Feels "fine".  No fevers or sweats.  Weight down 5 pounds since last visit. PERFORMANCE STATUS (ECOG):  3 HEENT:  No visual changes, runny nose, sore throat, mouth sores or tenderness. Lungs: No shortness of breath or cough.  No hemoptysis. Cardiac:  No chest pain, palpitations, orthopnea, or PND. GI:  No nausea, vomiting, diarrhea, constipation, melena or hematochezia. GU:  No urgency, frequency, dysuria, or hematuria. Musculoskeletal:  No back pain.  No joint pain.  No muscle tenderness. Extremities:  No pain or swelling. Skin:  Left side of face discolored.  No rashes or skin changes. Neuro:  Unsteady gait.  No seizures.  No headache, numbness or weakness, balance or coordination issues. Endocrine:  No diabetes, thyroid issues, hot  flashes or night sweats. Psych:  No mood changes, depression or anxiety. Pain:  No focal pain. Review of systems:  All other systems reviewed and found to be negative.   Physical Exam: There were no vitals taken for this visit. GENERAL:  Well developed, well nourished, woman sitting comfortably in the exam room in no acute distress.   MENTAL STATUS:  Alert and oriented to person, place and time. HEAD:  Brown braided hair.  Flat facial expression.  Normocephalic, atraumatic, face symmetric, no Cushingoid features. EYES:  Small palpebral fissure.  Brown eyes.  Pupils equal round and reactive to light and accomodation.  No conjunctivitis or scleral icterus. ENT:  Oropharynx clear without lesion.  Dentures.  Tongue normal. Mucous membranes moist.  RESPIRATORY:  Clear to auscultation without rales, wheezes or rhonchi. CARDIOVASCULAR:  Regular rate and rhythm without murmur, rub or gallop. ABDOMEN:  Soft, non-tender, with active bowel sounds, and no appreciable hepatosplenomegaly.  No masses. SKIN:  Mild facial acne.  Slight pigmentation left side of face.  No rashes, ulcers or lesions. EXTREMITIES: Chronic 2+ lower extremity changes (no change).  No skin discoloration or tenderness.  No palpable cords. LYMPH NODES: No palpable cervical, supraclavicular, axillary or inguinal adenopathy  NEUROLOGICAL:  Slow.  Appropriate. PSYCH:  Appropriate.   No visits with results within 3 Day(s) from this visit.  Latest known visit with results is:  Clinical Support on 09/07/2015  Component Date Value Ref Range Status  . WBC 09/07/2015 18.7* 3.6 - 11.0 K/uL Final  . RBC 09/07/2015 4.75  3.80 - 5.20 MIL/uL Final  . Hemoglobin 09/07/2015 10.2* 12.0 - 16.0 g/dL Final  . HCT 09/07/2015 32.7* 35.0 - 47.0 % Final  . MCV 09/07/2015 68.9* 80.0 - 100.0 fL Final  . MCH 09/07/2015 21.4* 26.0 - 34.0 pg Final  . MCHC 09/07/2015 31.1* 32.0 - 36.0 g/dL Final  . RDW 09/07/2015 16.6* 11.5 - 14.5 % Final  . Platelets  09/07/2015 231  150 - 440 K/uL Final  . Neutrophils Relative % 09/07/2015 29  % Final  . Neutro Abs 09/07/2015 5.4  1.4 - 6.5 K/uL Final  . Lymphocytes Relative 09/07/2015 66  % Final  . Lymphs Abs 09/07/2015 12.4* 1.0 - 3.6 K/uL Final  . Monocytes Relative 09/07/2015 4  % Final  . Monocytes Absolute 09/07/2015 0.8  0.2 - 0.9 K/uL Final  . Eosinophils Relative 09/07/2015 1  % Final  . Eosinophils Absolute 09/07/2015 0.1  0 - 0.7 K/uL Final  . Basophils Relative 09/07/2015 0  % Final  . Basophils Absolute 09/07/2015 0.0  0 - 0.1 K/uL Final  . Sodium 09/07/2015 138  135 -  145 mmol/L Final  . Potassium 09/07/2015 4.1  3.5 - 5.1 mmol/L Final  . Chloride 09/07/2015 105  101 - 111 mmol/L Final  . CO2 09/07/2015 26  22 - 32 mmol/L Final  . Glucose, Bld 09/07/2015 69  65 - 99 mg/dL Final  . BUN 09/07/2015 13  6 - 20 mg/dL Final  . Creatinine, Ser 09/07/2015 0.61  0.44 - 1.00 mg/dL Final  . Calcium 09/07/2015 9.0  8.9 - 10.3 mg/dL Final  . Total Protein 09/07/2015 8.4* 6.5 - 8.1 g/dL Final  . Albumin 09/07/2015 3.7  3.5 - 5.0 g/dL Final  . AST 09/07/2015 18  15 - 41 U/L Final  . ALT 09/07/2015 14  14 - 54 U/L Final  . Alkaline Phosphatase 09/07/2015 101  38 - 126 U/L Final  . Total Bilirubin 09/07/2015 0.4  0.3 - 1.2 mg/dL Final  . GFR calc non Af Amer 09/07/2015 >60  >60 mL/min Final  . GFR calc Af Amer 09/07/2015 >60  >60 mL/min Final   Comment: (NOTE) The eGFR has been calculated using the CKD EPI equation. This calculation has not been validated in all clinical situations. eGFR's persistently <60 mL/min signify possible Chronic Kidney Disease.   . Anion gap 09/07/2015 7  5 - 15 Final  . Ferritin 09/07/2015 62  11 - 307 ng/mL Final  . Total Protein ELP 09/08/2015 8.0  6.0 - 8.5 g/dL Final  . Albumin ELP 09/08/2015 3.9  2.9 - 4.4 g/dL Final  . Alpha-1-Globulin 09/08/2015 0.2  0.0 - 0.4 g/dL Final  . Alpha-2-Globulin 09/08/2015 0.7  0.4 - 1.0 g/dL Final  . Beta Globulin 09/08/2015 2.6*  0.7 - 1.3 g/dL Final  . Gamma Globulin 09/08/2015 0.7  0.4 - 1.8 g/dL Final  . M-Spike, % 09/08/2015 1.7* Not Observed g/dL Final  . SPE Interp. 09/08/2015 Comment   Final   Comment: (NOTE) The SPE pattern demonstrates a single peak (M-spike) in the beta region which may represent monoclonal protein. This peak may also be caused by the presence of fibrinogen or circulating immune complexes. If clinically indicated, the presence of a monoclonal gammopathy may be confirmed by immunofixation, as well as evaluation of the urine for the presence of Bence-Jones protein. Performed At: [facility] [place], [place] [id number] Lindon Romp MD KG:[id number]   . Comment 09/08/2015 Comment   Final   Comment: (NOTE) Protein electrophoresis scan will follow via computer, mail, or courier delivery.   Marland Kitchen GLOBULIN, TOTAL 09/08/2015 4.1* 2.2 - 3.9 g/dL Corrected  . A/G Ratio 09/08/2015 1.0  0.7 - 1.7 Corrected  . LDH 09/07/2015 140  98 - 192 U/L Final  . Hgb A2 Quant 09/09/2015 2.1  0.7 - 3.1 % Final  . Hgb F Quant 09/09/2015 0.0  0.0 - 2.0 % Final  . Hgb S Quant 09/09/2015 0.0  0.0 % Final  . Hgb C 09/09/2015 0.0  0.0 % Corrected  . Hgb A 09/09/2015 97.9  94.0 - 98.0 % Final  . Please Note: 09/09/2015 Comment   Corrected   Comment: (NOTE) Normal adult hemoglobin present. Performed At: [facility] [place], [place] [id number] Lindon Romp MD [id number]   . IgM, Serum 09/09/2015 2962* 26 - 217 mg/dL Final   Comment: (NOTE) Results confirmed on dilution. Performed At: [facility] [address] [place], [place] [id number] Lindon Romp MD [id number]:[id number]     Assessment:  Laurie Rice is a 56 y.o. female  with  a history of lymphocytosis and neutrophilia dating back to at least 09/2006. Flow cytometry on 11/25/2008 revealed a low-grade B-cell lymphoma (CD20 positive, CD5 negative).  Phenotype did not suggest a specific  histologic type and was not typical for CLL/SLL, mantle cell lymphoma, hairy cell leukemia or follicular center cell lymphoma. BCR-ABL testing was negative.    In association, she has an IgM gammopathy.  Monoclonal protein was 300 mg/dL in 11/2008 and then later 1.3 g/dL.  On 07/07/2014,  IgM was 2420 (high) with an IgG 1129 (normal).  SPEP reveals a monoclonal spike in the beta region of 1.6 gm/dL- stable in the past 6 months.  LDH was 133.  Uric acid 5.3.    IgM is as follows: 0947 on 07/07/2014, 2510 on 11/09/2014, 2624 on 05/27/2015, and 2962 on 09/07/2015.  SPEP is as follows: 1.6 gm/dL on 07/07/2014, 1.6 gm/dL on 11/09/2014, 1.6 on 05/27/2015, and 1.7 on 09/07/2015.   She has had chronic microcytic anemia since 2008.  Hemoglobin has ranged between 10 and 11.  She has had negative stool guaiacs. B12 was normal.  Iron saturation was 19%. Hemoglobin electrophoresis revealed a normal hemoglobin F and A2 thus ruling out beta thalassemia. Colonoscopy in 05/2011 was normal.  She has a history of an abnormal thyroid noted on CT scan with abnormal PET uptake in 2010. Ultrasound guided biopsy on 06/24/2012 was negative.  She has a seizure disorder and a meningioma. Meningioma was 5 mm on 11/2008.  Head MRI on 07/14/2014 revealed small chronic subfrontal meningioma minimally increased in size from 5 mm to 8 mm since 2010.  There was asymmetric appearance of the mesial right temporal lobe including the right hippocampal formation.  Labs on 07/07/2014 revealed a hematocrit of 33.4, hemoglobin 10.0, MCV 69.2, platelets 275,000, WBC 22,800 with an ANC of 14,600. Absolute lymphocyte count (ALC) was 14,600.  Retic was 1.2% (low for level of anemia).  Ferritin was 34.  ESR was 20.  Ferritin goal is 100.  She has a history of bilateral DVT and pulmonary embolism in 2010. She developed a right lower extremity DVT on 07/09/2014 secondary to a sub-therapeutic INR.  She was also treated for a cellulitis.  She is on  Coumadin 7 mg a day (managed by her PCP).   Symptomatically, she feels fine.  Exam reveals chronic lower extremity edema.  She has lost 8 pounds in 10 months.  She has a stable microcytic anemia.  She is on oral iron.  Ferritin is 62.  SPEP reveals an M-spike of 1.7 gm/dL.  IgM is slowly creeping up.  Plan: 1.  Labs today:  CBC with diff, CMP, ferritin, SPEP, IgM.   2.  Continue oral iron.  Ferritin goal 100. 3.  RTC in 3 months for MD assessment and labs (CBC with diff, CMP, ferritin, IgM, SPEP).   Lequita Asal, MD  12/07/2015, 8:26 AM

## 2016-08-15 ENCOUNTER — Other Ambulatory Visit: Payer: Self-pay | Admitting: Family Medicine

## 2016-08-15 DIAGNOSIS — Z1231 Encounter for screening mammogram for malignant neoplasm of breast: Secondary | ICD-10-CM

## 2016-09-27 ENCOUNTER — Ambulatory Visit
Admission: RE | Admit: 2016-09-27 | Discharge: 2016-09-27 | Disposition: A | Discharge status: Home / Self Care | Payer: Medicare Other | Source: Ambulatory Visit | Attending: Family Medicine | Admitting: Family Medicine

## 2016-09-27 DIAGNOSIS — Z1231 Encounter for screening mammogram for malignant neoplasm of breast: Secondary | ICD-10-CM

## 2017-03-30 DIAGNOSIS — E049 Nontoxic goiter, unspecified: Secondary | ICD-10-CM | POA: Insufficient documentation

## 2017-08-21 ENCOUNTER — Emergency Department
Admission: EM | Admit: 2017-08-21 | Discharge: 2017-08-22 | Disposition: A | Discharge status: Home / Self Care | Payer: Medicare Other | Attending: Emergency Medicine | Admitting: Emergency Medicine

## 2017-08-21 ENCOUNTER — Other Ambulatory Visit: Payer: Self-pay

## 2017-08-21 DIAGNOSIS — G40909 Epilepsy, unspecified, not intractable, without status epilepticus: Secondary | ICD-10-CM | POA: Diagnosis not present

## 2017-08-21 DIAGNOSIS — N3000 Acute cystitis without hematuria: Secondary | ICD-10-CM | POA: Insufficient documentation

## 2017-08-21 DIAGNOSIS — Z7901 Long term (current) use of anticoagulants: Secondary | ICD-10-CM | POA: Insufficient documentation

## 2017-08-21 DIAGNOSIS — Z79899 Other long term (current) drug therapy: Secondary | ICD-10-CM | POA: Insufficient documentation

## 2017-08-21 DIAGNOSIS — F70 Mild intellectual disabilities: Secondary | ICD-10-CM | POA: Insufficient documentation

## 2017-08-21 DIAGNOSIS — R569 Unspecified convulsions: Secondary | ICD-10-CM | POA: Diagnosis present

## 2017-08-21 NOTE — ED Triage Notes (Addendum)
Pt arrived via [facility] from [facility] with c/o seizures. EMS states the caregivers said that the pt had a seizure this morning as well as tonight and did not have any seizures prior. EMS states pt is on dilantin for seizures.

## 2017-08-22 ENCOUNTER — Emergency Department: Payer: Medicare Other

## 2017-08-22 DIAGNOSIS — G40909 Epilepsy, unspecified, not intractable, without status epilepticus: Secondary | ICD-10-CM | POA: Diagnosis not present

## 2017-08-22 LAB — COMPREHENSIVE METABOLIC PANEL
ALT: 14 U/L (ref 0–44)
AST: 21 U/L (ref 15–41)
Albumin: 3.6 g/dL (ref 3.5–5.0)
Alkaline Phosphatase: 90 U/L (ref 38–126)
Anion gap: 7 (ref 5–15)
BUN: 19 mg/dL (ref 6–20)
CALCIUM: 9.3 mg/dL (ref 8.9–10.3)
CO2: 28 mmol/L (ref 22–32)
CREATININE: 0.74 mg/dL (ref 0.44–1.00)
Chloride: 104 mmol/L (ref 98–111)
Glucose, Bld: 99 mg/dL (ref 70–99)
Potassium: 3.7 mmol/L (ref 3.5–5.1)
Sodium: 139 mmol/L (ref 135–145)
Total Bilirubin: 0.3 mg/dL (ref 0.3–1.2)
Total Protein: 8.1 g/dL (ref 6.5–8.1)

## 2017-08-22 LAB — URINALYSIS, COMPLETE (UACMP) WITH MICROSCOPIC
Bilirubin Urine: NEGATIVE
GLUCOSE, UA: NEGATIVE mg/dL
KETONES UR: NEGATIVE mg/dL
Nitrite: POSITIVE — AB
PH: 6 (ref 5.0–8.0)
Protein, ur: NEGATIVE mg/dL
Specific Gravity, Urine: 1.008 (ref 1.005–1.030)

## 2017-08-22 LAB — CBC
HEMATOCRIT: 34.2 % — AB (ref 35.0–47.0)
HEMOGLOBIN: 10.6 g/dL — AB (ref 12.0–16.0)
MCH: 22 pg — ABNORMAL LOW (ref 26.0–34.0)
MCHC: 31 g/dL — ABNORMAL LOW (ref 32.0–36.0)
MCV: 70.9 fL — AB (ref 80.0–100.0)
PLATELETS: 205 10*3/uL (ref 150–440)
RBC: 4.83 MIL/uL (ref 3.80–5.20)
RDW: 15.8 % — ABNORMAL HIGH (ref 11.5–14.5)
WBC: 39.6 10*3/uL — AB (ref 3.6–11.0)

## 2017-08-22 LAB — PHENYTOIN LEVEL, TOTAL

## 2017-08-22 LAB — PROTIME-INR
INR: 1.6
Prothrombin Time: 18.9 seconds — ABNORMAL HIGH (ref 11.4–15.2)

## 2017-08-22 MED ORDER — FOSPHENYTOIN SODIUM 500 MG PE/10ML IJ SOLN: 1500.0000 mg | Freq: Once | INTRAMUSCULAR | Status: DC

## 2017-08-22 MED ORDER — CEPHALEXIN 500 MG PO CAPS
500.0000 mg | ORAL_CAPSULE | Freq: Once | ORAL | Status: AC
  Administered 2017-08-22: 500 mg via ORAL
  Filled 2017-08-22: qty 1

## 2017-08-22 MED ORDER — SODIUM CHLORIDE 0.9 % IV SOLN
1500.0000 mg | Freq: Once | INTRAVENOUS | Status: AC
  Administered 2017-08-22: 1500 mg via INTRAVENOUS
  Filled 2017-08-22: qty 30

## 2017-08-22 MED ORDER — CEPHALEXIN 500 MG PO CAPS: 500.0000 mg | ORAL_CAPSULE | Freq: Two times a day (BID) | ORAL | 0 refills | Status: AC

## 2017-08-22 NOTE — ED Notes (Addendum)
This nurse given number [phone] to call for transport back to facility.

## 2017-08-22 NOTE — ED Notes (Addendum)
Number given for [facility] called and person answered that said it would be 30 min-1 hour before patient could be picked up. This nurse told person who answered the phone that worked at facility that she would be here as soon as possible.

## 2017-08-22 NOTE — ED Notes (Addendum)
Staff member from facility [phone]) stated that transportation service was en route to the ED at this time and will be here very soon for pt's d/c.

## 2017-08-22 NOTE — ED Notes (Signed)
Patient transported to CT at this time. 

## 2017-08-22 NOTE — ED Notes (Signed)
Unable to obtain urine. Pt incontinent. Pt briefs changed by this RN.

## 2017-08-22 NOTE — ED Provider Notes (Signed)
[facility] Emergency Department Provider Note    First MD Initiated Contact with Patient 08/21/17 2325     (approximate)  I have reviewed the triage vital signs and the nursing notes.   HISTORY  Chief Complaint Seizures    HPI Laurie Rice is a 57 y.o. female with below list of chronic medical conditions eluding seizure disorder for which the patient takes phenobarbital and Dilantin presents emergency department from [facility] following 2 witnessed seizures today.  Per the patient's cousin at bedside the patient has not had a seizure" a while".  Patient denies any complaints at present.  No recent illness.  Any cough fever.  Patient denies any diarrhea or constipation.  Patient denies any vomiting.  Past Medical History:  Diagnosis Date  . Anemia    chronic, mcv chronically low  . Cellulitis    History of  . 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 History of lymphocytosis   . Impetigo    History of  . Liver masses    History of, Benign  . Meningioma ([place])   . Mild mental retardation   . Monoclonal gammopathy    low level  . Personal history of pulmonary embolism   . Seizures ([place])   . Thyroid mass    history of    Patient Active Problem List   Diagnosis Date Noted  . 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 Cellulitis 07/10/2014  . DVT (deep venous thrombosis) ([place]) 07/09/2014  . Cellulitis of leg, right 07/09/2014  . Mild mental retardation 07/09/2014  . Seizures ([place]) 07/09/2014  . Subtherapeutic international normalized ratio (INR) 07/09/2014  . Meningioma ([place]) 07/07/2014  . Microcytic anemia 07/07/2014  . 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 NO PAST SURGERIES      Prior to Admission medications   Medication Sig Start Date End Date Taking? Authorizing Provider  Emollient (CETAPHIL) cream APPLY SMALL AMOUNT TO  AFFECTED AREAS ONCE DAILY 04/07/14   [provider]  ferrous sulfate 325 (65 FE) MG EC tablet Take 1 tablet (325 mg total) by mouth daily with breakfast. With orange juice 12/09/14   Corcoran, Melissa C, MD  PHENobarbital (LUMINAL) 32.4 MG tablet Take 64.8 mg by mouth at bedtime.     [provider]  phenytoin (DILANTIN) 100 MG ER capsule TAKE 2 CAPSULES BY MOUTH AT BEDTIME (SEIZURE DISORDER) 11/19/13   [provider]  phenytoin (DILANTIN) 50 MG tablet Chew 50 mg by mouth at bedtime.    [provider]  warfarin (COUMADIN) 1 MG tablet TAKE 2 TABLETS (2MG ) BY MOUTH ONCE DAILY (ALONG W/ 5MG =7MG  TOTAL DOSAGE). 02/11/14   [provider]  warfarin (COUMADIN) 5 MG tablet TAKE 1 TABLET BY MOUTH EVERY DAY (ALONG W/ 2MG =7MG  TOTAL DOSAGE). 02/11/14   [provider]    Allergies No known drug allergies  Family History  Problem Relation Age of Onset  . Hypertension Unknown   . Arthritis Unknown     Social History Social History   Tobacco Use  . Smoking status: Never Smoker  Substance Use Topics  . Alcohol use: No  . Drug use: No    Review of Systems Constitutional: No fever/chills Eyes: No visual changes. ENT: No sore throat. Cardiovascular: Denies chest pain. Respiratory: Denies shortness of breath. Gastrointestinal: No abdominal pain.  No nausea, no vomiting.  No diarrhea.  No  constipation. Genitourinary: Negative for dysuria. Musculoskeletal: Negative for neck pain.  Negative for back pain. Integumentary: Negative for rash. Neurological: Negative for headaches, focal weakness or numbness.   ____________________________________________   PHYSICAL EXAM:  VITAL SIGNS: ED Triage Vitals [08/21/17 2235]  Enc Vitals Group     BP (!) 171/67     Pulse Rate 70     Resp 18     Temp 98.1 F (36.7 C)     Temp Source Oral     SpO2 100 %     Weight 90.7 kg (200 lb)     Height 1.626 m (5\' 4" )     Head Circumference      Peak Flow       Pain Score 0     Pain Loc      Pain Edu?      Excl. in [place]?     Constitutional: Alert and oriented. Well appearing and in no acute distress. Eyes: Conjunctivae are normal.  Head: Atraumatic. Mouth/Throat: Mucous membranes are moist.  Oropharynx non-erythematous. Neck: No stridor.   Cardiovascular: Normal rate, regular rhythm. Good peripheral circulation. Grossly normal heart sounds. Respiratory: Normal respiratory effort.  No retractions. Lungs CTAB. Gastrointestinal: Soft and nontender. No distention.  Musculoskeletal: No lower extremity tenderness nor edema. No gross deformities of extremities. Neurologic:  Normal speech and language. No gross focal neurologic deficits are appreciated.  Skin:  Skin is warm, dry and intact. No rash noted.   ____________________________________________   LABS (all labs ordered are listed, but only abnormal results are displayed)  Labs Reviewed  CBC - Abnormal; Notable for the following components:      Result Value   WBC 39.6 (*)    Hemoglobin 10.6 (*)    HCT 34.2 (*)    MCV 70.9 (*)    MCH 22.0 (*)    MCHC 31.0 (*)    RDW 15.8 (*)    All other components within normal limits  PHENYTOIN LEVEL, TOTAL - Abnormal; Notable for the following components:   Phenytoin Lvl <2.5 (*)    All other components within normal limits  COMPREHENSIVE METABOLIC PANEL  URINALYSIS, COMPLETE (UACMP) WITH MICROSCOPIC   _____  RADIOLOGY I, Olivet N BROWN, personally viewed and evaluated these images (plain radiographs) as part of my medical decision making, as well as reviewing the written report by the radiologist.  ED MD interpretation: No acute findings on CT had noted  Official radiology report(s): No results found.   Procedures   ____________________________________________   INITIAL IMPRESSION / ASSESSMENT AND PLAN / ED COURSE  As part of my medical decision making, I reviewed the following data within the electronic MEDICAL RECORD NUMBER    57 year old female presenting with above-stated history and physical exam following 2 witnessed seizures today.  Patient's Dilantin level noted to be less than 2.5 and as such patient given fosphenytoin 1 g.  In addition patient noted to have a urinary tract infection on laboratory done and as such patient was given Keflex and will be prescribed same for home.  Patient with no eizure-like activity while in the emergency department     ____________________________________________  FINAL CLINICAL IMPRESSION(S) / ED DIAGNOSES  Final diagnoses:  Seizure disorder ([place])  Acute cystitis without hematuria     MEDICATIONS GIVEN DURING THIS VISIT:  Medications  phenytoin (DILANTIN) 1,500 mg in sodium chloride 0.9 % 250 mL IVPB (0 mg Intravenous Stopped 08/22/17 0236)     ED Discharge Orders    None  Note:  This document was prepared using Dragon voice recognition software and may include unintentional dictation errors.    Gregor Hams, MD 08/23/17 [phone]

## 2017-10-05 DIAGNOSIS — I10 Essential (primary) hypertension: Secondary | ICD-10-CM | POA: Diagnosis present

## 2017-11-15 ENCOUNTER — Other Ambulatory Visit: Payer: Self-pay | Admitting: Family Medicine

## 2017-11-15 DIAGNOSIS — Z1231 Encounter for screening mammogram for malignant neoplasm of breast: Secondary | ICD-10-CM

## 2018-07-18 ENCOUNTER — Other Ambulatory Visit: Payer: Self-pay

## 2018-07-18 ENCOUNTER — Ambulatory Visit
Admission: RE | Admit: 2018-07-18 | Discharge: 2018-07-18 | Disposition: A | Discharge status: Home / Self Care | Payer: Medicare Other | Source: Ambulatory Visit | Attending: Family Medicine | Admitting: Family Medicine

## 2018-07-18 DIAGNOSIS — Z1231 Encounter for screening mammogram for malignant neoplasm of breast: Secondary | ICD-10-CM | POA: Diagnosis not present

## 2018-07-20 ENCOUNTER — Encounter: Payer: Self-pay | Admitting: Emergency Medicine

## 2018-07-20 ENCOUNTER — Emergency Department
Admission: EM | Admit: 2018-07-20 | Discharge: 2018-07-20 | Disposition: A | Discharge status: Home / Self Care | Payer: Medicare Other | Attending: Emergency Medicine | Admitting: Emergency Medicine

## 2018-07-20 ENCOUNTER — Other Ambulatory Visit: Payer: Self-pay

## 2018-07-20 DIAGNOSIS — Z7901 Long term (current) use of anticoagulants: Secondary | ICD-10-CM | POA: Diagnosis not present

## 2018-07-20 DIAGNOSIS — Z86718 Personal history of other venous thrombosis and embolism: Secondary | ICD-10-CM | POA: Diagnosis not present

## 2018-07-20 DIAGNOSIS — L0291 Cutaneous abscess, unspecified: Secondary | ICD-10-CM

## 2018-07-20 DIAGNOSIS — Z79899 Other long term (current) drug therapy: Secondary | ICD-10-CM | POA: Insufficient documentation

## 2018-07-20 DIAGNOSIS — Z86711 Personal history of pulmonary embolism: Secondary | ICD-10-CM | POA: Insufficient documentation

## 2018-07-20 DIAGNOSIS — L02414 Cutaneous abscess of left upper limb: Secondary | ICD-10-CM | POA: Diagnosis not present

## 2018-07-20 DIAGNOSIS — F70 Mild intellectual disabilities: Secondary | ICD-10-CM | POA: Diagnosis not present

## 2018-07-20 MED ORDER — PROBIOTIC 250 MG PO CAPS: 250.0000 mg | ORAL_CAPSULE | Freq: Two times a day (BID) | ORAL | 0 refills | Status: AC

## 2018-07-20 MED ORDER — CLINDAMYCIN HCL 150 MG PO CAPS
300.0000 mg | ORAL_CAPSULE | Freq: Once | ORAL | Status: AC
  Administered 2018-07-20: 300 mg via ORAL
  Filled 2018-07-20: qty 2

## 2018-07-20 MED ORDER — CLINDAMYCIN HCL 300 MG PO CAPS: 300.0000 mg | ORAL_CAPSULE | Freq: Four times a day (QID) | ORAL | 0 refills | Status: DC

## 2018-07-20 NOTE — ED Triage Notes (Signed)
Pt presents to ED via [facility] with c/o abscess to L shoulder. Per EMS pt has wound to L shoulder that was noted by [facility] today, wound was packed by staff with dressing placed over it by staff at family care home. Per EMS pt VSS and WNL, EMS reports that patient has appt with PCP on Monday that was cancelled by family care home.

## 2018-07-20 NOTE — ED Notes (Signed)
[facility] notified of pending D/C.

## 2018-07-20 NOTE — ED Notes (Signed)
Pt D/C into the care of Jocelyn from [facility]. Unable to obtain D/C signature due to patient hx of MR. Legal guardian aware of D/C. D/C instructions reviewed with Jocelyn from [facility]. Pt taken to lobby via wheelchair.

## 2018-07-20 NOTE — Discharge Instructions (Addendum)
In addition to antibiotic, please use probiotic twice daily for at least a week after finishing antibiotic.  Patient should be evaluated by primary care in 2 days for packing change, evaluation of wound.

## 2018-07-20 NOTE — ED Provider Notes (Signed)
[facility] Emergency Department Provider Note  ____________________________________________  Time seen: Approximately 1:37 PM  I have reviewed the triage vital signs and the nursing notes.   HISTORY  Chief Complaint Abscess    HPI Laurie Rice is a 58 y.o. female who presents the emergency department for evaluation of an abscess to the left shoulder.  Patient presents via EMS from a group home.  According to the group home staff, patient had a worsening erythematous and edematous lesion to the posterior shoulder.  This ruptured and drained spontaneously today.  They applied packing to the area with dressing over top.  Patient was sent to the emergency department for further evaluation.  No other reported complaints.  Patient denies any complaints at this time.         Past Medical History:  Diagnosis Date  . Anemia    chronic, mcv chronically low  . Cellulitis    History of  . 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 History of lymphocytosis   . Impetigo    History of  . Liver masses    History of, Benign  . Meningioma ([place])   . Mild mental retardation   . Monoclonal gammopathy    low level  . Personal history of pulmonary embolism   . Seizures ([place])   . Thyroid mass    history of    Patient Active Problem List   Diagnosis Date Noted  . 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 Cellulitis 07/10/2014  . DVT (deep venous thrombosis) ([place]) 07/09/2014  . Cellulitis of leg, right 07/09/2014  . Mild mental retardation 07/09/2014  . Seizures ([place]) 07/09/2014  . Subtherapeutic international normalized ratio (INR) 07/09/2014  . Meningioma ([place]) 07/07/2014  . Microcytic anemia 07/07/2014  . 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 NO PAST SURGERIES      Prior to Admission medications   Medication Sig Start Date End Date Taking? Authorizing Provider   clindamycin (CLEOCIN) 300 MG capsule Take 1 capsule (300 mg total) by mouth 4 (four) times daily. 07/20/18   Walker Paddack, Charline Bills, PA-C  Emollient (CETAPHIL) cream APPLY SMALL AMOUNT TO AFFECTED AREAS ONCE DAILY 04/07/14   [provider]  ferrous sulfate 325 (65 FE) MG EC tablet Take 1 tablet (325 mg total) by mouth daily with breakfast. With orange juice 12/09/14   Corcoran, Drue Second, MD  PHENobarbital (LUMINAL) 32.4 MG tablet Take 64.8 mg by mouth at bedtime.     [provider]  phenytoin (DILANTIN) 100 MG ER capsule TAKE 2 CAPSULES BY MOUTH AT BEDTIME (SEIZURE DISORDER) 11/19/13   [provider]  phenytoin (DILANTIN) 50 MG tablet Chew 50 mg by mouth at bedtime.    [provider]  Saccharomyces boulardii (PROBIOTIC) 250 MG CAPS Take 250 mg by mouth 2 (two) times daily for 14 days. 07/20/18 08/03/18  Demarius Archila, Charline Bills, PA-C  warfarin (COUMADIN) 1 MG tablet TAKE 2 TABLETS (2MG ) BY MOUTH ONCE DAILY (ALONG W/ 5MG =7MG  TOTAL DOSAGE). 02/11/14   [provider]  warfarin (COUMADIN) 5 MG tablet TAKE 1 TABLET BY MOUTH EVERY DAY (ALONG W/ 2MG =7MG  TOTAL DOSAGE). 02/11/14   [provider]    Allergies Patient has no known allergies.  Family History  Problem Relation Age of Onset  . Hypertension Other   . Arthritis Other   . Breast cancer Neg Hx     Social  History Social History   Tobacco Use  . Smoking status: Never Smoker  . Smokeless tobacco: Never Used  Substance Use Topics  . Alcohol use: No  . Drug use: No     Review of Systems  Constitutional: No fever/chills Eyes: No visual changes. No discharge ENT: No upper respiratory complaints. Cardiovascular: no chest pain. Respiratory: no cough. No SOB. Gastrointestinal: No abdominal pain.  No nausea, no vomiting.  Musculoskeletal: Negative for musculoskeletal pain. Skin: Positive for abscess to left shoulder Neurological: Negative for headaches, focal weakness or  numbness. 10-point ROS otherwise negative.  ____________________________________________   PHYSICAL EXAM:  VITAL SIGNS: ED Triage Vitals  Enc Vitals Group     BP 07/20/18 1223 (!) 155/70     Pulse Rate 07/20/18 1223 72     Resp 07/20/18 1223 18     Temp 07/20/18 1223 100 F (37.8 C)     Temp Source 07/20/18 1223 Oral     SpO2 07/20/18 1223 100 %     Weight 07/20/18 1224 180 lb (81.6 kg)     Height 07/20/18 1224 5\' 5"  (1.651 m)     Head Circumference --      Peak Flow --      Pain Score 07/20/18 1223 0     Pain Loc --      Pain Edu? --      Excl. in [place]? --      Constitutional: Alert and oriented. Well appearing and in no acute distress. Eyes: Conjunctivae are normal. PERRL. EOMI. Head: Atraumatic. ENT:      Ears:       Nose: No congestion/rhinnorhea.      Mouth/Throat: Mucous membranes are moist.  Neck: No stridor.  No cervical spine tenderness to palpation. Hematological/Lymphatic/Immunilogical: No cervical lymphadenopathy. Cardiovascular: Normal rate, regular rhythm. Normal S1 and S2.  Good peripheral circulation. Respiratory: Normal respiratory effort without tachypnea or retractions. Lungs CTAB. Good air entry to the bases with no decreased or absent breath sounds. Musculoskeletal: Full range of motion to all extremities. No gross deformities appreciated. Neurologic:  Normal speech and language. No gross focal neurologic deficits are appreciated.  Skin:  Skin is warm, dry and intact. No rash noted.  Evaluation of the posterior left shoulder reveals an erythematous and edematous lesion.  Area has spontaneously opened.  Packing observed from group home.  No active drainage.  Total area measures approximately 8 cm in diameter.  Edges are firm but no fluctuance.  Palpation does not express any additional purulent drainage.  No streaking.  No regional lymphadenopathy. Psychiatric: Mood and affect are normal. Speech and behavior are normal. Patient exhibits appropriate insight  and judgement.   ____________________________________________   LABS (all labs ordered are listed, but only abnormal results are displayed)  Labs Reviewed - No data to display ____________________________________________  EKG   ____________________________________________  RADIOLOGY   No results found.  ____________________________________________    PROCEDURES  Procedure(s) performed:    Procedures    Medications  clindamycin (CLEOCIN) capsule 300 mg (has no administration in time range)     ____________________________________________   INITIAL IMPRESSION / ASSESSMENT AND PLAN / ED COURSE  Pertinent labs & imaging results that were available during my care of the patient were reviewed by me and considered in my medical decision making (see chart for details).  Review of the [place] CSRS was performed in accordance of the [place] prior to dispensing any controlled drugs.           Patient's  diagnosis is consistent with abscess of the left shoulder.  Patient presented to the emergency department from a group home for evaluation of a spontaneously draining abscess.  Abscess was previously packed by group home.  At this time, findings are consistent with spontaneously draining abscess.  No evidence of deeper underlying abscess requiring further incision and drainage.  Packing is left in place.  Patient will be placed on antibiotics with first dose being given here in the emergency department.  Patient was placed on clindamycin..  Follow-up with primary care in 2 days for wound evaluation.  No labs or imaging at this time.. Patient is given ED precautions to return to the ED for any worsening or new symptoms.     ____________________________________________  FINAL CLINICAL IMPRESSION(S) / ED DIAGNOSES  Final diagnoses:  Abscess      NEW MEDICATIONS STARTED DURING THIS VISIT:  ED Discharge Orders         Ordered    clindamycin (CLEOCIN) 300 MG capsule  4  times daily     07/20/18 1343    Saccharomyces boulardii (PROBIOTIC) 250 MG CAPS  2 times daily     07/20/18 1343              This chart was dictated using voice recognition software/Dragon. Despite best efforts to proofread, errors can occur which can change the meaning. Any change was purely unintentional.    Darletta Moll, PA-C 07/20/18 1354    Arta Silence, MD 07/20/18 1539

## 2018-07-20 NOTE — ED Notes (Signed)
Pt assisted to the toilet, clean brief, clean linens given to pt at this time. Will continue to monitor.

## 2019-07-01 ENCOUNTER — Encounter: Payer: Self-pay | Admitting: Oncology

## 2019-07-01 NOTE — Progress Notes (Signed)
Patient contacted for new patient visit. Reviewed chart/ medications with patient's caregiver, Thomasene Mohair.

## 2019-07-02 ENCOUNTER — Encounter (INDEPENDENT_AMBULATORY_CARE_PROVIDER_SITE_OTHER): Payer: Self-pay

## 2019-07-02 ENCOUNTER — Other Ambulatory Visit: Payer: Self-pay

## 2019-07-02 ENCOUNTER — Inpatient Hospital Stay: Payer: Medicare Other | Attending: Oncology | Admitting: Oncology

## 2019-07-02 ENCOUNTER — Inpatient Hospital Stay: Payer: Medicare Other

## 2019-07-02 VITALS — BP 133/76 | HR 60 | Temp 96.3°F | Resp 18 | Ht 65.0 in | Wt 177.5 lb

## 2019-07-02 DIAGNOSIS — D72820 Lymphocytosis (symptomatic): Secondary | ICD-10-CM

## 2019-07-02 DIAGNOSIS — D509 Iron deficiency anemia, unspecified: Secondary | ICD-10-CM

## 2019-07-02 DIAGNOSIS — R160 Hepatomegaly, not elsewhere classified: Secondary | ICD-10-CM | POA: Insufficient documentation

## 2019-07-02 DIAGNOSIS — Z8572 Personal history of non-Hodgkin lymphomas: Secondary | ICD-10-CM | POA: Insufficient documentation

## 2019-07-02 DIAGNOSIS — D472 Monoclonal gammopathy: Secondary | ICD-10-CM | POA: Insufficient documentation

## 2019-07-02 DIAGNOSIS — D72829 Elevated white blood cell count, unspecified: Secondary | ICD-10-CM | POA: Diagnosis not present

## 2019-07-02 DIAGNOSIS — Z7901 Long term (current) use of anticoagulants: Secondary | ICD-10-CM | POA: Insufficient documentation

## 2019-07-02 DIAGNOSIS — Z86718 Personal history of other venous thrombosis and embolism: Secondary | ICD-10-CM | POA: Insufficient documentation

## 2019-07-02 LAB — CBC WITH DIFFERENTIAL/PLATELET
Abs Immature Granulocytes: 0.05 10*3/uL (ref 0.00–0.07)
Basophils Absolute: 0.1 10*3/uL (ref 0.0–0.1)
Basophils Relative: 0 %
Eosinophils Absolute: 0.1 10*3/uL (ref 0.0–0.5)
Eosinophils Relative: 0 %
HCT: 33 % — ABNORMAL LOW (ref 36.0–46.0)
Hemoglobin: 10 g/dL — ABNORMAL LOW (ref 12.0–15.0)
Immature Granulocytes: 0 %
Lymphocytes Relative: 86 %
Lymphs Abs: 33.9 10*3/uL — ABNORMAL HIGH (ref 0.7–4.0)
MCH: 22 pg — ABNORMAL LOW (ref 26.0–34.0)
MCHC: 30.3 g/dL (ref 30.0–36.0)
MCV: 72.7 fL — ABNORMAL LOW (ref 80.0–100.0)
Monocytes Absolute: 1.4 10*3/uL — ABNORMAL HIGH (ref 0.1–1.0)
Monocytes Relative: 3 %
Neutro Abs: 4.3 10*3/uL (ref 1.7–7.7)
Neutrophils Relative %: 11 %
Platelets: 196 10*3/uL (ref 150–400)
RBC: 4.54 MIL/uL (ref 3.87–5.11)
RDW: 16.5 % — ABNORMAL HIGH (ref 11.5–15.5)
WBC: 39.8 10*3/uL — ABNORMAL HIGH (ref 4.0–10.5)
nRBC: 0 % (ref 0.0–0.2)

## 2019-07-02 LAB — COMPREHENSIVE METABOLIC PANEL
ALT: 15 U/L (ref 0–44)
AST: 16 U/L (ref 15–41)
Albumin: 3.5 g/dL (ref 3.5–5.0)
Alkaline Phosphatase: 100 U/L (ref 38–126)
Anion gap: 10 (ref 5–15)
BUN: 14 mg/dL (ref 6–20)
CO2: 27 mmol/L (ref 22–32)
Calcium: 8.8 mg/dL — ABNORMAL LOW (ref 8.9–10.3)
Chloride: 102 mmol/L (ref 98–111)
Creatinine, Ser: 0.69 mg/dL (ref 0.44–1.00)
GFR calc Af Amer: >60 mL/min (ref >60)
GFR calc non Af Amer: >60 mL/min (ref >60)
Glucose, Bld: 91 mg/dL (ref 70–99)
Potassium: 3.9 mmol/L (ref 3.5–5.1)
Sodium: 139 mmol/L (ref 135–145)
Total Bilirubin: 0.4 mg/dL (ref 0.3–1.2)
Total Protein: 8.6 g/dL — ABNORMAL HIGH (ref 6.5–8.1)

## 2019-07-02 LAB — HEPATITIS PANEL, ACUTE
HCV Ab: NONREACTIVE
Hep A IgM: NONREACTIVE
Hep B C IgM: NONREACTIVE
Hepatitis B Surface Ag: NONREACTIVE

## 2019-07-02 LAB — TECHNOLOGIST SMEAR REVIEW: Tech Review: NORMAL

## 2019-07-02 LAB — HIV ANTIBODY (ROUTINE TESTING W REFLEX): HIV Screen 4th Generation wRfx: NONREACTIVE

## 2019-07-02 LAB — LACTATE DEHYDROGENASE: LDH: 124 U/L (ref 98–192)

## 2019-07-02 NOTE — Progress Notes (Signed)
Hematology/Oncology Consult note [facility] Telephone:([phone] [phone]   Patient Care Team: Juluis Pitch, MD as PCP - General (Family Medicine)  REFERRING PROVIDER: Juluis Pitch, MD  CHIEF COMPLAINTS/REASON FOR VISIT:  Evaluation of leukocytosis  HISTORY OF PRESENTING ILLNESS:  Laurie Rice is a  59 y.o.  female with PMH listed below who was referred to me for evaluation of leukocytosis Reviewed patient' recent labs via care everywhere.  06/11/2027 CBC showed elevated white count of 38.5, hemoglobin 10.2, MCV 74.7, absolute lymphocyte 30.4, basophil 0.13, Previous lab records reviewed. Leukocytosis onset of chronic, duration is since at least 2014 Anemia is also chronic since at least 2016 No aggravating or elevated factors. Patient is a poor historian.  She has a seizure disorder and meningioma.  She is not medical competent and has legal guardian.  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    Associated symptoms or signs:  Denies weight loss, fever, chills,night sweats.  Recent history of weight loss about 10 pounds over the past 1 year.  Smoking history: Never smoking History of recent oral steroid use or steroid injection: Denies History of recent infection: Denies Autoimmune disease history.  Denies  Reviewed patient's past medical history, was previously seen by Dr. Mike Gip with last visit more than 3 years ago.  Today she wants to reestablish care at [facility] site. 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  Phenotype did not suggest a specific histologic type and was not typical for CLL/SLL, mantle cell lymphoma, hairy cell leukemia or follicular cell lymphoma.  BCR ABL testing was negative.  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  Chronic microcytic anemia since 2008.  She had  hemoglobin electrophoresis reviewed normal hemoglobin F and A2 Patient has a history of bilateral DVT and pulmonary embolism in 2010.  Has been on Coumadin chronically She developed right lower extremity DVT on 07/09/2014 secondary to subtherapeutic INR  Review of Systems  Constitutional: Negative for appetite change, chills, fatigue and fever.  HENT:   Negative for hearing loss and voice change.   Eyes: Negative for eye problems.  Respiratory: Negative for chest tightness and cough.   Cardiovascular: Negative for chest pain.  Gastrointestinal: Negative for abdominal distention, abdominal pain and blood in stool.  Endocrine: Negative for hot flashes.  Genitourinary: Negative for difficulty urinating and frequency.   Musculoskeletal: Negative for arthralgias.  Skin: Negative for itching and rash.  Neurological: Negative for extremity weakness.  Hematological: Negative for adenopathy.  Psychiatric/Behavioral: Negative for confusion.     MEDICAL HISTORY:  Past Medical History:  Diagnosis Date  . Anemia    chronic, mcv chronically low  . Cellulitis    History of  . 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 History of lymphocytosis   . Impetigo    History of  . Liver masses    History of, Benign  . Meningioma ([place])   . Mild mental retardation   . Monoclonal gammopathy    low level  . Personal history of pulmonary embolism   . Seizures ([place])   . Thyroid mass    history of    SURGICAL HISTORY: Past Surgical History:  Procedure Laterality Date  . NO PAST SURGERIES      SOCIAL HISTORY: Social History   Socioeconomic History  . Marital status: Single    Spouse  name: Not on file  . Number of children: Not on file  . Years of education: Not on file  . Highest education level: Not on file  Occupational History  . Not on file  Tobacco Use  . Smoking status: Never Smoker  . Smokeless tobacco: Never Used  Substance and Sexual Activity  . Alcohol  use: No  . Drug use: No  . Sexual activity: Not on file  Other Topics Concern  . Not on file  Social History Narrative  . Not on file   Social Determinants of Health   Financial Resource Strain:   . Difficulty of Paying Living Expenses:   Food Insecurity:   . Worried About Charity fundraiser in the Last Year:   . Arboriculturist in the Last Year:   Transportation Needs:   . Film/video editor (Medical):   Marland Kitchen Lack of Transportation (Non-Medical):   Physical Activity:   . Days of Exercise per Week:   . Minutes of Exercise per Session:   Stress:   . Feeling of Stress :   Social Connections:   . Frequency of Communication with Friends and Family:   . Frequency of Social Gatherings with Friends and Family:   . Attends Religious Services:   . Active Member of Clubs or Organizations:   . Attends Archivist Meetings:   Marland Kitchen Marital Status:   Intimate Partner Violence:   . Fear of Current or Ex-Partner:   . Emotionally Abused:   Marland Kitchen Physically Abused:   . Sexually Abused:     FAMILY HISTORY: Family History  Problem Relation Age of Onset  . Hypertension Other   . Arthritis Other   . Breast cancer Neg Hx     ALLERGIES:  has No Known Allergies.  MEDICATIONS:  Current Outpatient Medications  Medication Sig Dispense Refill  . bisoprolol-hydrochlorothiazide (ZIAC) 2.5-6.25 MG tablet Take 2 tablets by mouth daily.    . Emollient (CETAPHIL) cream APPLY SMALL AMOUNT TO AFFECTED AREAS ONCE DAILY    . ferrous sulfate 325 (65 FE) MG EC tablet Take 1 tablet (325 mg total) by mouth daily with breakfast. With orange juice 30 tablet 3  . folic acid (FOLVITE) 1 MG tablet TAKE ONE TABLET BY MOUTH EACH DAY.    Marland Kitchen PHENobarbital (LUMINAL) 32.4 MG tablet Take 64.8 mg by mouth at bedtime.     . phenytoin (DILANTIN) 100 MG ER capsule TAKE 2 CAPSULES BY MOUTH AT BEDTIME (SEIZURE DISORDER)    . phenytoin (DILANTIN) 50 MG tablet Chew 50 mg by mouth at bedtime.    . triamcinolone cream  (KENALOG) 0.1 % Apply topically.    . warfarin (COUMADIN) 1 MG tablet TAKE 2 TABLETS (2MG) BY MOUTH ONCE DAILY (ALONG W/ 5MG=7MG TOTAL DOSAGE).    Marland Kitchen warfarin (COUMADIN) 5 MG tablet TAKE 1 TABLET BY MOUTH EVERY DAY (ALONG W/ 2MG=7MG TOTAL DOSAGE).    Marland Kitchen clindamycin (CLEOCIN) 300 MG capsule Take 1 capsule (300 mg total) by mouth 4 (four) times daily. (Patient not taking: Reported on 07/01/2019) 28 capsule 0   No current facility-administered medications for this visit.     PHYSICAL EXAMINATION: ECOG PERFORMANCE STATUS: 1 - Symptomatic but completely ambulatory Vitals:   07/02/19 1107  BP: 133/76  Pulse: 60  Resp: 18  Temp: (!) 96.3 F (35.7 C)   Filed Weights   07/02/19 1107  Weight: 177 lb 8 oz (80.5 kg)    Physical Exam  CMP Latest Ref Rng &  Units 07/02/2019  Glucose 70 - 99 mg/dL 91  BUN 6 - 20 mg/dL 14  Creatinine 0.44 - 1.00 mg/dL 0.69  Sodium 135 - 145 mmol/L 139  Potassium 3.5 - 5.1 mmol/L 3.9  Chloride 98 - 111 mmol/L 102  CO2 22 - 32 mmol/L 27  Calcium 8.9 - 10.3 mg/dL 8.8(L)  Total Protein 6.5 - 8.1 g/dL 8.6(H)  Total Bilirubin 0.3 - 1.2 mg/dL 0.4  Alkaline Phos 38 - 126 U/L 100  AST 15 - 41 U/L 16  ALT 0 - 44 U/L 15   CBC Latest Ref Rng & Units 07/02/2019  WBC 4.0 - 10.5 K/uL 39.8(H)  Hemoglobin 12.0 - 15.0 g/dL 10.0(L)  Hematocrit 36 - 46 % 33.0(L)  Platelets 150 - 400 K/uL 196     RADIOGRAPHIC STUDIES: I have personally reviewed the radiological images as listed and agreed with the findings in the report. No results found.  LABORATORY DATA:  I have reviewed the data as listed Lab Results  Component Value Date   WBC 39.8 (H) 07/02/2019   HGB 10.0 (L) 07/02/2019   HCT 33.0 (L) 07/02/2019   MCV 72.7 (L) 07/02/2019   PLT 196 07/02/2019   Recent Labs    07/02/19 1145  NA 139  K 3.9  CL 102  CO2 27  GLUCOSE 91  BUN 14  CREATININE 0.69  CALCIUM 8.8*  GFRNONAA >60  GFRAA >60  PROT 8.6*  ALBUMIN 3.5  AST 16  ALT 15  ALKPHOS 100  BILITOT  0.4   Iron/TIBC/Ferritin/ %Sat    Component Value Date/Time   IRON 66 11/09/2014 1455   IRON 94 08/22/2012 1142   TIBC 225 (L) 11/09/2014 1455   TIBC 221 (L) 08/22/2012 1142   FERRITIN 62 09/07/2015 0957   FERRITIN 65 08/22/2012 1142   IRONPCTSAT 29 11/09/2014 1455   IRONPCTSAT 43 08/22/2012 1142        ASSESSMENT & PLAN:  1. Lymphocytosis   2. Monoclonal gammopathy   3. Microcytic anemia    Labs reviewed and discussed with patient that Leukocytosis, predominantly neutrophilia, can be secondary to infection, chronic inflammation, smoking, autoimmune disease, or underlying bone marrow disorders.  Patient has a history of chronic low-grade B-cell lymphoma. For the work up of patient's leukocytosis, I recommend checking CBC;CMP, LDH, pathology smear review, flowcytometry, hepatitis, HIV etc. Also discussed possible bone marrow biopsy if above workup is inconclusive; however I would prefer not to do a bone marrow unless absolutely needed.  History of IgM gammopathy, of check multiple myeloma panel, light chain ratio. Chronic microcytic anemia, previous hemoglobinopathy showed normal hemoglobin.  Her previous work-up showed normal ferritin and iron saturation.  She probably has alpha thalassemia.  We can confirm it in the future.  Orders Placed This Encounter  Procedures  . CBC with Differential/Platelet    Standing Status:   Future    Number of Occurrences:   1    Standing Expiration Date:   07/01/2020  . Comprehensive metabolic panel    Standing Status:   Future    Number of Occurrences:   1    Standing Expiration Date:   07/01/2020  . Technologist smear review    Standing Status:   Future    Number of Occurrences:   1    Standing Expiration Date:   07/01/2020  . Flow cytometry panel-leukemia/lymphoma work-up    Standing Status:   Future    Number of Occurrences:   1    Standing Expiration  Date:   07/01/2020  . Hepatitis panel, acute    Standing Status:   Future    Number  of Occurrences:   1    Standing Expiration Date:   07/01/2020  . HIV Antibody (routine testing w rflx)    Standing Status:   Future    Number of Occurrences:   1    Standing Expiration Date:   07/01/2020  . Lactate dehydrogenase    Standing Status:   Future    Number of Occurrences:   1    Standing Expiration Date:   07/01/2020  . Multiple Myeloma Panel (SPEP&IFE w/QIG)    Standing Status:   Future    Number of Occurrences:   1    Standing Expiration Date:   07/01/2020    All questions were answered. The patient knows to call the clinic with any problems questions or concerns.  Return of visit: 2 to 3 weeks to discuss lab results and management plan. Thank you for this kind referral and the opportunity to participate in the care of this patient. A copy of today's note is routed to referring provider   Earlie Server, MD, PhD Hematology Oncology [facility] at [facility] Pager- [phone] 07/02/2019

## 2019-07-04 LAB — MULTIPLE MYELOMA PANEL, SERUM
Albumin SerPl Elph-Mcnc: 3.6 g/dL (ref 2.9–4.4)
Albumin/Glob SerPl: 0.8 (ref 0.7–1.7)
Alpha 1: 0.3 g/dL (ref 0.0–0.4)
Alpha2 Glob SerPl Elph-Mcnc: 0.7 g/dL (ref 0.4–1.0)
B-Globulin SerPl Elph-Mcnc: 2.9 g/dL — ABNORMAL HIGH (ref 0.7–1.3)
Gamma Glob SerPl Elph-Mcnc: 0.6 g/dL (ref 0.4–1.8)
Globulin, Total: 4.6 g/dL — ABNORMAL HIGH (ref 2.2–3.9)
IgA: 67 mg/dL — ABNORMAL LOW (ref 87–352)
IgG (Immunoglobin G), Serum: 782 mg/dL (ref 586–1602)
IgM (Immunoglobulin M), Srm: 3366 mg/dL — ABNORMAL HIGH (ref 26–217)
M Protein SerPl Elph-Mcnc: 2.1 g/dL — ABNORMAL HIGH
Total Protein ELP: 8.2 g/dL (ref 6.0–8.5)

## 2019-07-08 LAB — COMP PANEL: LEUKEMIA/LYMPHOMA: Immunophenotypic Profile: 71

## 2019-07-15 ENCOUNTER — Other Ambulatory Visit: Payer: Self-pay | Admitting: Family Medicine

## 2019-07-15 DIAGNOSIS — Z1231 Encounter for screening mammogram for malignant neoplasm of breast: Secondary | ICD-10-CM

## 2019-07-29 ENCOUNTER — Inpatient Hospital Stay: Payer: Medicare Other | Attending: Oncology | Admitting: Oncology

## 2019-07-29 ENCOUNTER — Encounter: Payer: Self-pay | Admitting: Oncology

## 2019-07-29 ENCOUNTER — Other Ambulatory Visit: Payer: Self-pay

## 2019-07-29 ENCOUNTER — Telehealth: Payer: Self-pay

## 2019-07-29 VITALS — BP 136/74 | HR 63 | Temp 96.0°F | Resp 18 | Wt 179.0 lb

## 2019-07-29 DIAGNOSIS — D509 Iron deficiency anemia, unspecified: Secondary | ICD-10-CM | POA: Insufficient documentation

## 2019-07-29 DIAGNOSIS — Z86711 Personal history of pulmonary embolism: Secondary | ICD-10-CM | POA: Diagnosis not present

## 2019-07-29 DIAGNOSIS — Z79899 Other long term (current) drug therapy: Secondary | ICD-10-CM | POA: Diagnosis not present

## 2019-07-29 DIAGNOSIS — E079 Disorder of thyroid, unspecified: Secondary | ICD-10-CM | POA: Diagnosis not present

## 2019-07-29 DIAGNOSIS — Z7901 Long term (current) use of anticoagulants: Secondary | ICD-10-CM | POA: Diagnosis not present

## 2019-07-29 DIAGNOSIS — Z86718 Personal history of other venous thrombosis and embolism: Secondary | ICD-10-CM | POA: Insufficient documentation

## 2019-07-29 DIAGNOSIS — D479 Neoplasm of uncertain behavior of lymphoid, hematopoietic and related tissue, unspecified: Secondary | ICD-10-CM | POA: Diagnosis not present

## 2019-07-29 DIAGNOSIS — D72820 Lymphocytosis (symptomatic): Secondary | ICD-10-CM | POA: Insufficient documentation

## 2019-07-29 DIAGNOSIS — D472 Monoclonal gammopathy: Secondary | ICD-10-CM | POA: Insufficient documentation

## 2019-07-29 DIAGNOSIS — G40909 Epilepsy, unspecified, not intractable, without status epilepticus: Secondary | ICD-10-CM | POA: Insufficient documentation

## 2019-07-29 NOTE — Telephone Encounter (Addendum)
Order faxed for BM bx.  Message left with Radford Pax (legal guardian) that he will need to accompany her to bx appt to sign consent.  Will contact caregiver Jobe Marker at [phone] with appt detail.    MD will have virtual visit with caregiver and legal guardian 2 weeks after BM bx.

## 2019-07-29 NOTE — Progress Notes (Signed)
Hematology/Oncology Consult note [facility] Telephone:([phone] [phone]   Patient Care Team: Juluis Pitch, MD as PCP - General (Family Medicine)  REFERRING PROVIDER: Juluis Pitch, MD  CHIEF COMPLAINTS/REASON FOR VISIT:  Evaluation of leukocytosis  HISTORY OF PRESENTING ILLNESS:  Laurie Rice is a  59 y.o.  female with PMH listed below who was referred to me for evaluation of leukocytosis Reviewed patient' recent labs via care everywhere.  06/11/2027 CBC showed elevated white count of 38.5, hemoglobin 10.2, MCV 74.7, absolute lymphocyte 30.4, basophil 0.13, Previous lab records reviewed. Leukocytosis onset of chronic, duration is since at least 2014 Anemia is also chronic since at least 2016 No aggravating or elevated factors. Patient is a poor historian.  She has a seizure disorder and meningioma.  She is not medical competent and has legal guardian.  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    Associated symptoms or signs:  Denies weight loss, fever, chills,night sweats.  Recent history of weight loss about 10 pounds over the past 1 year.  Smoking history: Never smoking History of recent oral steroid use or steroid injection: Denies History of recent infection: Denies Autoimmune disease history.  Denies  Reviewed patient's past medical history, was previously seen by Dr. Mike Gip with last visit more than 3 years ago.  Today she wants to reestablish care at [facility] site. 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  Phenotype did not suggest a specific histologic type and was not typical for CLL/SLL, mantle cell lymphoma, hairy cell leukemia or follicular cell lymphoma.  BCR ABL testing was negative.  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  Chronic microcytic anemia since 2008.  She had  hemoglobin electrophoresis reviewed normal hemoglobin F and A2 Patient has a history of bilateral DVT and pulmonary embolism in 2010.  Has been on Coumadin chronically She developed right lower extremity DVT on 07/09/2014 secondary to subtherapeutic INR  Review of Systems  Unable to perform ROS: Other (Intellectual disability)  Constitutional: Positive for fatigue and unexpected weight change.     MEDICAL HISTORY:  Past Medical History:  Diagnosis Date  . Anemia    chronic, mcv chronically low  . Cellulitis    History of  . 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 History of lymphocytosis   . Impetigo    History of  . Liver masses    History of, Benign  . Meningioma ([place])   . Mild mental retardation   . Monoclonal gammopathy    low level  . Personal history of pulmonary embolism   . Seizures ([place])   . Thyroid mass    history of    SURGICAL HISTORY: Past Surgical History:  Procedure Laterality Date  . NO PAST SURGERIES      SOCIAL HISTORY: Social History   Socioeconomic History  . Marital status: Single    Spouse name: Not on file  . Number of children: Not on file  . Years of education: Not on file  . Highest education level: Not on file  Occupational History  . Not on file  Tobacco Use  . Smoking status: Never Smoker  . Smokeless tobacco: Never Used  Substance and Sexual Activity  . Alcohol use: No  . Drug use: No  . Sexual activity: Not on file  Other Topics Concern  . Not on  file  Social History Narrative  . Not on file   Social Determinants of Health   Financial Resource Strain:   . Difficulty of Paying Living Expenses:   Food Insecurity:   . Worried About Charity fundraiser in the Last Year:   . Arboriculturist in the Last Year:   Transportation Needs:   . Film/video editor (Medical):   Marland Kitchen Lack of Transportation (Non-Medical):   Physical Activity:   . Days of Exercise per Week:   . Minutes of Exercise per Session:    Stress:   . Feeling of Stress :   Social Connections:   . Frequency of Communication with Friends and Family:   . Frequency of Social Gatherings with Friends and Family:   . Attends Religious Services:   . Active Member of Clubs or Organizations:   . Attends Archivist Meetings:   Marland Kitchen Marital Status:   Intimate Partner Violence:   . Fear of Current or Ex-Partner:   . Emotionally Abused:   Marland Kitchen Physically Abused:   . Sexually Abused:     FAMILY HISTORY: Family History  Problem Relation Age of Onset  . Hypertension Other   . Arthritis Other   . Breast cancer Neg Hx     ALLERGIES:  has No Known Allergies.  MEDICATIONS:  Current Outpatient Medications  Medication Sig Dispense Refill  . bisoprolol-hydrochlorothiazide (ZIAC) 2.5-6.25 MG tablet Take 2 tablets by mouth daily.    . Emollient (CETAPHIL) cream APPLY SMALL AMOUNT TO AFFECTED AREAS ONCE DAILY    . ferrous sulfate 325 (65 FE) MG EC tablet Take 1 tablet (325 mg total) by mouth daily with breakfast. With orange juice 30 tablet 3  . folic acid (FOLVITE) 1 MG tablet TAKE ONE TABLET BY MOUTH EACH DAY.    Marland Kitchen PHENobarbital (LUMINAL) 32.4 MG tablet Take 60 mg by mouth at bedtime.     . phenytoin (DILANTIN) 100 MG ER capsule TAKE 2 CAPSULES BY MOUTH AT BEDTIME (SEIZURE DISORDER)    . phenytoin (DILANTIN) 50 MG tablet Chew 50 mg by mouth at bedtime.    . triamcinolone cream (KENALOG) 0.1 % Apply topically.    . warfarin (COUMADIN) 1 MG tablet TAKE 2 TABLETS (2MG) BY MOUTH ONCE DAILY (ALONG W/ 5MG=7MG TOTAL DOSAGE).    Marland Kitchen warfarin (COUMADIN) 5 MG tablet TAKE 1 TABLET BY MOUTH EVERY DAY (ALONG W/ 2MG=7MG TOTAL DOSAGE).    Marland Kitchen clindamycin (CLEOCIN) 300 MG capsule Take 1 capsule (300 mg total) by mouth 4 (four) times daily. (Patient not taking: Reported on 07/01/2019) 28 capsule 0   No current facility-administered medications for this visit.     PHYSICAL EXAMINATION: ECOG PERFORMANCE STATUS: 1 - Symptomatic but completely  ambulatory Vitals:   07/29/19 1324  BP: (!) 136/74  Pulse: 63  Resp: 18  Temp: (!) 96 F (35.6 C)   Filed Weights   07/29/19 1324  Weight: 179 lb (81.2 kg)    Physical Exam Constitutional:      General: She is not in acute distress. HENT:     Head: Normocephalic and atraumatic.  Eyes:     General: No scleral icterus. Cardiovascular:     Rate and Rhythm: Normal rate and regular rhythm.     Heart sounds: Normal heart sounds.  Pulmonary:     Effort: Pulmonary effort is normal. No respiratory distress.     Breath sounds: No wheezing.  Abdominal:     General: Bowel sounds are normal. There  is no distension.     Palpations: Abdomen is soft.  Musculoskeletal:        General: No deformity. Normal range of motion.     Cervical back: Normal range of motion and neck supple.  Skin:    General: Skin is warm and dry.     Findings: No erythema or rash.  Neurological:     Mental Status: She is alert. Mental status is at baseline.  Psychiatric:        Mood and Affect: Mood normal.     CMP Latest Ref Rng & Units 07/02/2019  Glucose 70 - 99 mg/dL 91  BUN 6 - 20 mg/dL 14  Creatinine 0.44 - 1.00 mg/dL 0.69  Sodium 135 - 145 mmol/L 139  Potassium 3.5 - 5.1 mmol/L 3.9  Chloride 98 - 111 mmol/L 102  CO2 22 - 32 mmol/L 27  Calcium 8.9 - 10.3 mg/dL 8.8(L)  Total Protein 6.5 - 8.1 g/dL 8.6(H)  Total Bilirubin 0.3 - 1.2 mg/dL 0.4  Alkaline Phos 38 - 126 U/L 100  AST 15 - 41 U/L 16  ALT 0 - 44 U/L 15   CBC Latest Ref Rng & Units 07/02/2019  WBC 4.0 - 10.5 K/uL 39.8(H)  Hemoglobin 12.0 - 15.0 g/dL 10.0(L)  Hematocrit 36 - 46 % 33.0(L)  Platelets 150 - 400 K/uL 196     RADIOGRAPHIC STUDIES: I have personally reviewed the radiological images as listed and agreed with the findings in the report. No results found.  LABORATORY DATA:  I have reviewed the data as listed Lab Results  Component Value Date   WBC 39.8 (H) 07/02/2019   HGB 10.0 (L) 07/02/2019   HCT 33.0 (L) 07/02/2019    MCV 72.7 (L) 07/02/2019   PLT 196 07/02/2019   Recent Labs    07/02/19 1145  NA 139  K 3.9  CL 102  CO2 27  GLUCOSE 91  BUN 14  CREATININE 0.69  CALCIUM 8.8*  GFRNONAA >60  GFRAA >60  PROT 8.6*  ALBUMIN 3.5  AST 16  ALT 15  ALKPHOS 100  BILITOT 0.4   Iron/TIBC/Ferritin/ %Sat    Component Value Date/Time   IRON 66 11/09/2014 1455   IRON 94 08/22/2012 1142   TIBC 225 (L) 11/09/2014 1455   TIBC 221 (L) 08/22/2012 1142   FERRITIN 62 09/07/2015 0957   FERRITIN 65 08/22/2012 1142   IRONPCTSAT 29 11/09/2014 1455   IRONPCTSAT 43 08/22/2012 1142        ASSESSMENT & PLAN:  1. 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 Monoclonal gammopathy    #Chronic lymphocytosis, flow cytometry showed CD5+, CD23+, FMC 7+ monoclonal B-cell lymphocyte population with lambda light chain restriction. IgM gammopathy,IgM level 3366, M protein is elevated at 2.1. I called patient's guardian Mr. Radford Pax and discussed with him about bone marrow biopsy to further evaluate possible lymphoproliferative disorder. I will obtain CT chest abdomen pelvis after bone marrow biopsy results.  Chronic microcytic anemia, previous hemoglobinopathy showed normal hemoglobin.  Her previous work-up showed normal ferritin and iron saturation.  She probably has alpha thalassemia.  We can confirm it in the future.  Orders Placed This Encounter  Procedures  . CT BONE MARROW BIOPSY & ASPIRATION    Standing Status:   Future    Standing Expiration Date:   07/28/2020    Order Specific Question:   Reason for Exam (SYMPTOM  OR DIAGNOSIS REQUIRED)    Answer:   lymphoproliferation    Order Specific Question:  Preferred location?    Answer:   [facility]    Order Specific Question:   Radiology Contrast Protocol - do NOT remove file path    Answer:   [email or web address]    Order Specific Question:   Is patient pregnant?    Answer:   No    All questions were answered. The patient  knows to call the clinic with any problems questions or concerns.  Return of visit: 2 weeks after bone marrow biopsy to discuss results. We spent sufficient time to discuss many aspect of care, questions were answered to patient's satisfaction. A total of 25 minutes was spent on this visit.  With 5 minutes spent reviewing pathology reports, 15 minutes discussion with patient's legal guardian about lab results, working diagnosis, plan of bone marrow biopsy and additional imaging work-up.  Additional 5 minutes was spent on answering patient's guardians questions.   Earlie Server, MD, PhD Hematology Oncology [facility] at [facility] Pager- [phone] 07/29/2019

## 2019-07-29 NOTE — Progress Notes (Signed)
Caregiver Jobe Marker (P: [phone]) states patient's legal guardian is Radford Pax (P: [phone])

## 2019-07-29 NOTE — Progress Notes (Signed)
Pt here for follow up. No new concerns voiced.   

## 2019-07-30 NOTE — Telephone Encounter (Signed)
Please schedule the MD visit to virtual with the contact number of patient's legal guardian Radford Pax (P: [phone]).

## 2019-07-30 NOTE — Telephone Encounter (Signed)
Done..  Appt to RTC for MD f/u 2 weeks after biopsy has been scheduled for 08/20/19 @10 :45

## 2019-07-30 NOTE — Telephone Encounter (Signed)
Caregiver Jobe Marker is aware of all appts.  I have faxed a medical clearance letter to Dr. Lovie Macadamia for patient to stop Warfarin 4 days prior to [facility] biopsy.  I will also call his office tomorrow.

## 2019-07-30 NOTE — Telephone Encounter (Signed)
Done...  MyChart visit on 08/20/19 @ 1:45pm A NEW appt reminder letter will be mailed out.

## 2019-07-30 NOTE — Telephone Encounter (Signed)
Bone marrow biopsy is scheduled for 08/06/19 @ 9:00 arrive at 8:00  Please schedule MD f/u 2 weeks after biopsy.  I will let them know of MD appt details when I call about the biopsy appt.

## 2019-07-31 NOTE — Telephone Encounter (Signed)
Called Dr. Reuel Boom office to request medical clearance for patient to stop Warfarin for the bone marrow biopsy.  Medical Clearance for was re-faxed.

## 2019-07-31 NOTE — Telephone Encounter (Signed)
Dr. Reuel Boom office did receive the fax but he is out of the office today and will return tomorrow.  I explained that I need a response by tomorrow since she would need to hold the Warfarin starting Saturday.  Will f/u with his office tomorrow.

## 2019-08-01 NOTE — Telephone Encounter (Signed)
We have not received the medical clearance for patient to hold Warfarin in preparation for the BM biopsy on Wed.  I spoke with Stanton Kidney at the facility that was aware of Ms. Witz's case and advised them to continue her current Warfarin regimen until we receive the clearance.  Stanton Kidney wrote down the instructions and verbalized back the instructions to continue Ms. Manchester's Warfarin.  Will contact specials on Monday to get the biopsy cancelled.

## 2019-08-04 NOTE — Telephone Encounter (Signed)
Confirmed that patient does NOT need to hold blood thinner for bone marrow biopsy.  Notified the care facility to keep the appt on Wed for the BM biopsy and keep all mediations as they are (no change).

## 2019-08-05 ENCOUNTER — Other Ambulatory Visit: Payer: Self-pay | Admitting: Radiology

## 2019-08-05 ENCOUNTER — Other Ambulatory Visit: Payer: Self-pay

## 2019-08-05 NOTE — Telephone Encounter (Signed)
Patient's legal guardian is going to accompany Ms. Hayashida to the [facility] bx and will be available to sign consent.

## 2019-08-06 ENCOUNTER — Other Ambulatory Visit: Payer: Self-pay

## 2019-08-06 ENCOUNTER — Ambulatory Visit
Admission: RE | Admit: 2019-08-06 | Discharge: 2019-08-06 | Disposition: A | Discharge status: Home / Self Care | Payer: Medicare Other | Source: Ambulatory Visit | Attending: Oncology | Admitting: Oncology

## 2019-08-06 DIAGNOSIS — E079 Disorder of thyroid, unspecified: Secondary | ICD-10-CM | POA: Insufficient documentation

## 2019-08-06 DIAGNOSIS — C8510 Unspecified B-cell lymphoma, unspecified site: Secondary | ICD-10-CM | POA: Insufficient documentation

## 2019-08-06 DIAGNOSIS — Z86711 Personal history of pulmonary embolism: Secondary | ICD-10-CM | POA: Insufficient documentation

## 2019-08-06 DIAGNOSIS — Z7901 Long term (current) use of anticoagulants: Secondary | ICD-10-CM | POA: Diagnosis not present

## 2019-08-06 DIAGNOSIS — Z803 Family history of malignant neoplasm of breast: Secondary | ICD-10-CM | POA: Diagnosis not present

## 2019-08-06 DIAGNOSIS — D509 Iron deficiency anemia, unspecified: Secondary | ICD-10-CM | POA: Diagnosis not present

## 2019-08-06 DIAGNOSIS — D61818 Other pancytopenia: Secondary | ICD-10-CM | POA: Insufficient documentation

## 2019-08-06 DIAGNOSIS — Z79899 Other long term (current) drug therapy: Secondary | ICD-10-CM | POA: Insufficient documentation

## 2019-08-06 DIAGNOSIS — Z8249 Family history of ischemic heart disease and other diseases of the circulatory system: Secondary | ICD-10-CM | POA: Diagnosis not present

## 2019-08-06 DIAGNOSIS — F70 Mild intellectual disabilities: Secondary | ICD-10-CM | POA: Diagnosis not present

## 2019-08-06 DIAGNOSIS — Z8261 Family history of arthritis: Secondary | ICD-10-CM | POA: Diagnosis not present

## 2019-08-06 DIAGNOSIS — D72829 Elevated white blood cell count, unspecified: Secondary | ICD-10-CM | POA: Diagnosis present

## 2019-08-06 DIAGNOSIS — D479 Neoplasm of uncertain behavior of lymphoid, hematopoietic and related tissue, unspecified: Secondary | ICD-10-CM

## 2019-08-06 DIAGNOSIS — Z86718 Personal history of other venous thrombosis and embolism: Secondary | ICD-10-CM | POA: Diagnosis not present

## 2019-08-06 LAB — CBC WITH DIFFERENTIAL/PLATELET
Abs Immature Granulocytes: 0.06 10*3/uL (ref 0.00–0.07)
Basophils Absolute: 0.1 10*3/uL (ref 0.0–0.1)
Basophils Relative: 0 %
Eosinophils Absolute: 0.1 10*3/uL (ref 0.0–0.5)
Eosinophils Relative: 0 %
HCT: 32.4 % — ABNORMAL LOW (ref 36.0–46.0)
Hemoglobin: 9.5 g/dL — ABNORMAL LOW (ref 12.0–15.0)
Immature Granulocytes: 0 %
Lymphocytes Relative: 82 %
Lymphs Abs: 23.2 10*3/uL — ABNORMAL HIGH (ref 0.7–4.0)
MCH: 21.6 pg — ABNORMAL LOW (ref 26.0–34.0)
MCHC: 29.3 g/dL — ABNORMAL LOW (ref 30.0–36.0)
MCV: 73.6 fL — ABNORMAL LOW (ref 80.0–100.0)
Monocytes Absolute: 1.3 10*3/uL — ABNORMAL HIGH (ref 0.1–1.0)
Monocytes Relative: 4 %
Neutro Abs: 4.1 10*3/uL (ref 1.7–7.7)
Neutrophils Relative %: 14 %
Platelets: 182 10*3/uL (ref 150–400)
RBC: 4.4 MIL/uL (ref 3.87–5.11)
RDW: 16.1 % — ABNORMAL HIGH (ref 11.5–15.5)
Smear Review: NORMAL
WBC: 28.6 10*3/uL — ABNORMAL HIGH (ref 4.0–10.5)
nRBC: 0 % (ref 0.0–0.2)

## 2019-08-06 LAB — PATHOLOGIST SMEAR REVIEW

## 2019-08-06 MED ORDER — SODIUM CHLORIDE 0.9 % IV SOLN
INTRAVENOUS | Status: DC
  Administered 2019-08-06: 10 mL via INTRAVENOUS

## 2019-08-06 MED ORDER — FENTANYL CITRATE (PF) 100 MCG/2ML IJ SOLN
INTRAMUSCULAR | Status: AC | PRN
  Administered 2019-08-06: 25 ug via INTRAVENOUS

## 2019-08-06 MED ORDER — MIDAZOLAM HCL 2 MG/2ML IJ SOLN
INTRAMUSCULAR | Status: AC | PRN
  Administered 2019-08-06: 0.5 mg via INTRAVENOUS

## 2019-08-06 MED ORDER — MIDAZOLAM HCL 2 MG/2ML IJ SOLN
INTRAMUSCULAR | Status: AC
  Filled 2019-08-06: qty 2

## 2019-08-06 MED ORDER — HEPARIN SOD (PORK) LOCK FLUSH 100 UNIT/ML IV SOLN
INTRAVENOUS | Status: AC
  Filled 2019-08-06: qty 5

## 2019-08-06 MED ORDER — FENTANYL CITRATE (PF) 100 MCG/2ML IJ SOLN
INTRAMUSCULAR | Status: AC
  Filled 2019-08-06: qty 2

## 2019-08-06 NOTE — Procedures (Signed)
Interventional Radiology Procedure Note  Procedure: CT guided aspirate and core biopsy of right iliac bone Complications: None Recommendations: - Bedrest supine x 1 hrs - Hydrocodone PRN  Pain - Follow biopsy results  Signed,  Harpreet Pompey K. Amaria Mundorf, MD   

## 2019-08-06 NOTE — Discharge Instructions (Signed)
Bone Marrow Aspiration and Bone Marrow Biopsy, Adult, Care After This sheet gives you information about how to care for yourself after your procedure. Your health care provider may also give you more specific instructions. If you have problems or questions, contact your health care provider. What can I expect after the procedure? After the procedure, it is common to have:  Mild pain and tenderness.  Swelling.  Bruising. Follow these instructions at home: Puncture site care   Follow instructions from your health care provider about how to take care of the puncture site. Make sure you: ? Wash your hands with soap and water before and after you change your bandage (dressing). If soap and water are not available, use hand sanitizer. ? Change your dressing as told by your health care provider.  Check your puncture site every day for signs of infection. Check for: ? More redness, swelling, or pain. ? Fluid or blood. ? Warmth. ? Pus or a bad smell. Activity  Return to your normal activities as told by your health care provider. Ask your health care provider what activities are safe for you.  Do not lift anything that is heavier than 10 lb (4.5 kg), or the limit that you are told, until your health care provider says that it is safe.  Do not drive for 24 hours if you were given a sedative during your procedure. General instructions   Take over-the-counter and prescription medicines only as told by your health care provider.  Do not take baths, swim, or use a hot tub until your health care provider approves. Ask your health care provider if you may take showers. You may only be allowed to take sponge baths.  If directed, put ice on the affected area. To do this: ? Put ice in a plastic bag. ? Place a towel between your skin and the bag. ? Leave the ice on for 20 minutes, 2-3 times a day.  Keep all follow-up visits as told by your health care provider. This is important. Contact a  health care provider if:  Your pain is not controlled with medicine.  You have a fever.  You have more redness, swelling, or pain around the puncture site.  You have fluid or blood coming from the puncture site.  Your puncture site feels warm to the touch.  You have pus or a bad smell coming from the puncture site. Summary  After the procedure, it is common to have mild pain, tenderness, swelling, and bruising.  Follow instructions from your health care provider about how to take care of the puncture site and what activities are safe for you.  Take over-the-counter and prescription medicines only as told by your health care provider.  Contact a health care provider if you have any signs of infection, such as fluid or blood coming from the puncture site. This information is not intended to replace advice given to you by your health care provider. Make sure you discuss any questions you have with your health care provider. Document Revised: 05/07/2018 Document Reviewed: 05/07/2018 Elsevier Patient Education  2020 Elsevier Inc. Moderate Conscious Sedation, Adult, Care After These instructions provide you with information about caring for yourself after your procedure. Your health care provider may also give you more specific instructions. Your treatment has been planned according to current medical practices, but problems sometimes occur. Call your health care provider if you have any problems or questions after your procedure. What can I expect after the procedure? After your procedure,   it is common:  To feel sleepy for several hours.  To feel clumsy and have poor balance for several hours.  To have poor judgment for several hours.  To vomit if you eat too soon. Follow these instructions at home: For at least 24 hours after the procedure:   Do not: ? Participate in activities where you could fall or become injured. ? Drive. ? Use heavy machinery. ? Drink alcohol. ? Take  sleeping pills or medicines that cause drowsiness. ? Make important decisions or sign legal documents. ? Take care of children on your own.  Rest. Eating and drinking  Follow the diet recommended by your health care provider.  If you vomit: ? Drink water, juice, or soup when you can drink without vomiting. ? Make sure you have little or no nausea before eating solid foods. General instructions  Have a responsible adult stay with you until you are awake and alert.  Take over-the-counter and prescription medicines only as told by your health care provider.  If you smoke, do not smoke without supervision.  Keep all follow-up visits as told by your health care provider. This is important. Contact a health care provider if:  You keep feeling nauseous or you keep vomiting.  You feel light-headed.  You develop a rash.  You have a fever. Get help right away if:  You have trouble breathing. This information is not intended to replace advice given to you by your health care provider. Make sure you discuss any questions you have with your health care provider. Document Revised: 12/01/2016 Document Reviewed: 04/10/2015 Elsevier Patient Education  2020 Elsevier Inc.  

## 2019-08-06 NOTE — Tx Team (Signed)
Pt back from procedure. Report received. Pt given a sandwich and a drink. Pt tolerating well. Family called to bedside. Per procedure RN, pt can be d/c at 1050.

## 2019-08-06 NOTE — H&P (Signed)
Chief Complaint: Patient was seen in consultation today for No chief complaint on file.  at the request of Yu,Zhou  Referring Physician(s): Yu,Zhou  Patient Status: [facility] - Out-pt  History of Present Illness: Laurie Rice is a 59 y.o. female with a history of chronic low gdare B-cell lymphoma, leukocytosis and microcytic anemia.  She has previously been seen by Dr. Haywood Pao years ago and recently presented to Dr. Tasia Catchings to re-establish care at [facility].  She presents today for a bone marrow biopsy to further assess her marrow dysfunction.    She is in her usual state of health and is accompanied by her cousin who is also her PoA.  She has developmental delay.  No active complaints at this time.   Past Medical History:  Diagnosis Date  . Anemia    chronic, mcv chronically low  . Cellulitis    History of  . 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 History of lymphocytosis   . Impetigo    History of  . Liver masses    History of, Benign  . Meningioma ([place])   . Mild mental retardation   . Monoclonal gammopathy    low level  . Personal history of pulmonary embolism   . Seizures ([place])   . Thyroid mass    history of    Past Surgical History:  Procedure Laterality Date  . NO PAST SURGERIES      Allergies: Patient has no known allergies.  Medications: Prior to Admission medications   Medication Sig Start Date End Date Taking? Authorizing Provider  bisoprolol-hydrochlorothiazide (ZIAC) 2.5-6.25 MG tablet Take 2 tablets by mouth daily. 09/04/18  Yes [provider]  Emollient (CETAPHIL) cream APPLY SMALL AMOUNT TO AFFECTED AREAS ONCE DAILY 04/07/14  Yes [provider]  ferrous sulfate 325 (65 FE) MG EC tablet Take 1 tablet (325 mg total) by mouth daily with breakfast. With orange juice 12/09/14  Yes Corcoran, Melissa C, MD  folic acid (FOLVITE) 1 MG tablet TAKE ONE TABLET BY MOUTH EACH DAY. 07/12/16  Yes [provider]    PHENobarbital (LUMINAL) 32.4 MG tablet Take 60 mg by mouth at bedtime.    Yes [provider]  phenytoin (DILANTIN) 100 MG ER capsule TAKE 2 CAPSULES BY MOUTH AT BEDTIME (SEIZURE DISORDER) 11/19/13  Yes [provider]  phenytoin (DILANTIN) 50 MG tablet Chew 50 mg by mouth at bedtime.   Yes [provider]  triamcinolone cream (KENALOG) 0.1 % Apply topically. 12/06/18 12/06/19 Yes [provider]  warfarin (COUMADIN) 1 MG tablet TAKE 2 TABLETS (2MG) BY MOUTH ONCE DAILY (ALONG W/ 5MG=7MG TOTAL DOSAGE). 02/11/14  Yes [provider]  warfarin (COUMADIN) 5 MG tablet TAKE 1 TABLET BY MOUTH EVERY DAY (ALONG W/ 2MG=7MG TOTAL DOSAGE). 02/11/14  Yes [provider]  clindamycin (CLEOCIN) 300 MG capsule Take 1 capsule (300 mg total) by mouth 4 (four) times daily. Patient not taking: Reported on 07/01/2019 07/20/18   Cuthriell, Charline Bills, PA-C     Family History  Problem Relation Age of Onset  . Hypertension Other   . Arthritis Other   . Breast cancer Neg Hx     Social History   Socioeconomic History  . Marital status: Single    Spouse name: Not on file  . Number of children: Not on file  . Years of education: Not on file  . Highest education level: Not on file  Occupational History  .  Not on file  Tobacco Use  . Smoking status: Never Smoker  . Smokeless tobacco: Never Used  Substance and Sexual Activity  . Alcohol use: No  . Drug use: No  . Sexual activity: Not on file  Other Topics Concern  . Not on file  Social History Narrative  . Not on file   Social Determinants of Health   Financial Resource Strain:   . Difficulty of Paying Living Expenses:   Food Insecurity:   . Worried About Charity fundraiser in the Last Year:   . Arboriculturist in the Last Year:   Transportation Needs:   . Film/video editor (Medical):   Marland Kitchen Lack of Transportation (Non-Medical):   Physical Activity:   . Days of Exercise per Week:   . Minutes of  Exercise per Session:   Stress:   . Feeling of Stress :   Social Connections:   . Frequency of Communication with Friends and Family:   . Frequency of Social Gatherings with Friends and Family:   . Attends Religious Services:   . Active Member of Clubs or Organizations:   . Attends Archivist Meetings:   Marland Kitchen Marital Status:     Review of Systems: A 12 point ROS discussed and pertinent positives are indicated in the HPI above.  All other systems are negative.  Review of Systems  Vital Signs: BP 139/66   Pulse 64   Temp 98.6 F (37 C) (Oral)   Resp 16   Ht _0  (1.676 m)   Wt 81.2 kg   SpO2 100%   BMI 28.89 kg/m   Physical Exam Constitutional:      Appearance: Normal appearance.  HENT:     Head: Normocephalic and atraumatic.  Eyes:     General: No scleral icterus. Cardiovascular:     Rate and Rhythm: Normal rate.  Pulmonary:     Effort: Pulmonary effort is normal.     Breath sounds: Normal breath sounds.  Abdominal:     General: Abdomen is flat.     Palpations: Abdomen is soft.  Skin:    General: Skin is warm and dry.  Neurological:     Mental Status: She is alert and oriented to person, place, and time.  Psychiatric:        Mood and Affect: Mood normal.        Behavior: Behavior normal.     Imaging: No results found.  Labs:  CBC: Recent Labs    07/02/19 1145 08/06/19 0901  WBC 39.8* 28.6*  HGB 10.0* 9.5*  HCT 33.0* 32.4*  PLT 196 182    COAGS: No results for input(s): INR, APTT in the last 8760 hours.  BMP: Recent Labs    07/02/19 1145  NA 139  K 3.9  CL 102  CO2 27  GLUCOSE 91  BUN 14  CALCIUM 8.8*  CREATININE 0.69  GFRNONAA >60  GFRAA >60    LIVER FUNCTION TESTS: Recent Labs    07/02/19 1145  BILITOT 0.4  AST 16  ALT 15  ALKPHOS 100  PROT 8.6*  ALBUMIN 3.5    TUMOR MARKERS: No results for input(s): AFPTM, CEA, CA199, CHROMGRNA in the last 8760 hours.  Assessment and Plan:  Chronic B-cell lymphoma,  leukocytosis, anemia.    1.) Proceed with CT guided bone marrow biopsy.   Thank you for this interesting consult.  I greatly enjoyed meeting Laurie Rice and look forward to participating in their care.  A copy of this report was sent to the requesting provider on this date.  Electronically Signed: Jacqulynn Cadet, MD 08/06/2019, 10:35 AM   I spent a total of  15 Minutes  in face to face in clinical consultation, greater than 50% of which was counseling/coordinating care for leukocytosis.

## 2019-08-08 LAB — SURGICAL PATHOLOGY

## 2019-08-14 ENCOUNTER — Encounter (HOSPITAL_COMMUNITY): Payer: Self-pay | Admitting: Oncology

## 2019-08-15 ENCOUNTER — Encounter (HOSPITAL_COMMUNITY): Payer: Self-pay | Admitting: Oncology

## 2019-08-20 ENCOUNTER — Inpatient Hospital Stay: Payer: Medicare Other | Attending: Oncology | Admitting: Oncology

## 2019-08-20 ENCOUNTER — Encounter: Payer: Self-pay | Admitting: Oncology

## 2019-08-20 DIAGNOSIS — D479 Neoplasm of uncertain behavior of lymphoid, hematopoietic and related tissue, unspecified: Secondary | ICD-10-CM | POA: Diagnosis not present

## 2019-08-20 DIAGNOSIS — D509 Iron deficiency anemia, unspecified: Secondary | ICD-10-CM | POA: Diagnosis not present

## 2019-08-20 DIAGNOSIS — D472 Monoclonal gammopathy: Secondary | ICD-10-CM | POA: Insufficient documentation

## 2019-08-20 NOTE — Progress Notes (Signed)
HEMATOLOGY-ONCOLOGY TeleHEALTH VISIT PROGRESS NOTE  I connected with Laurie Rice on 08/20/19 at  1:45 PM EDT by video enabled telemedicine visit and verified that I am speaking with the correct person using two identifiers. I discussed the limitations, risks, security and privacy concerns of performing an evaluation and management service by telemedicine and the availability of in-person appointments. I also discussed with the patient that there may be a patient responsible charge related to this service. The patient expressed understanding and agreed to proceed.   Other persons participating in the visit and their role in the encounter:  Caregiver and legal guardian [name]  Patient's location: Home  Provider's location: office Chief Complaint: Follow-up for bone marrow biopsy results.   INTERVAL HISTORY Laurie Rice is a 59 y.o. female who has above history reviewed by me today presents for follow up visit for management of bone marrow biopsy results Problems and complaints are listed below:  Patient has had bone marrow biopsy done recently. Caregiver reports that patient has lost weight recently.  Review of Systems  Unable to perform ROS: Other (Intellectual disability)    Past Medical History:  Diagnosis Date  . Anemia    chronic, mcv chronically low  . Cellulitis    History of  . 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 History of lymphocytosis   . Impetigo    History of  . Liver masses    History of, Benign  . Meningioma ([place])   . Mild mental retardation   . Monoclonal gammopathy    low level  . Personal history of pulmonary embolism   . Seizures ([place])   . Thyroid mass    history of   Past Surgical History:  Procedure Laterality Date  . NO PAST SURGERIES      Family History  Problem Relation Age of Onset  . Hypertension Other   . Arthritis Other   . Breast cancer Neg Hx     Social History   Socioeconomic History  .  Marital status: Single    Spouse name: Not on file  . Number of children: Not on file  . Years of education: Not on file  . Highest education level: Not on file  Occupational History  . Not on file  Tobacco Use  . Smoking status: Never Smoker  . Smokeless tobacco: Never Used  Substance and Sexual Activity  . Alcohol use: No  . Drug use: No  . Sexual activity: Not on file  Other Topics Concern  . Not on file  Social History Narrative  . Not on file   Social Determinants of Health   Financial Resource Strain:   . Difficulty of Paying Living Expenses:   Food Insecurity:   . Worried About Charity fundraiser in the Last Year:   . Arboriculturist in the Last Year:   Transportation Needs:   . Film/video editor (Medical):   Marland Kitchen Lack of Transportation (Non-Medical):   Physical Activity:   . Days of Exercise per Week:   . Minutes of Exercise per Session:   Stress:   . Feeling of Stress :   Social Connections:   . Frequency of Communication with Friends and Family:   . Frequency of Social Gatherings with Friends and Family:   . Attends Religious Services:   . Active Member of Clubs or Organizations:   . Attends Archivist Meetings:   Marland Kitchen Marital Status:  Intimate Partner Violence:   . Fear of Current or Ex-Partner:   . Emotionally Abused:   Marland Kitchen Physically Abused:   . Sexually Abused:     Current Outpatient Medications on File Prior to Visit  Medication Sig Dispense Refill  . bisoprolol-hydrochlorothiazide (ZIAC) 2.5-6.25 MG tablet Take 2 tablets by mouth daily.    . Emollient (CETAPHIL) cream APPLY SMALL AMOUNT TO AFFECTED AREAS ONCE DAILY    . ferrous sulfate 325 (65 FE) MG EC tablet Take 1 tablet (325 mg total) by mouth daily with breakfast. With orange juice 30 tablet 3  . folic acid (FOLVITE) 1 MG tablet TAKE ONE TABLET BY MOUTH EACH DAY.    Marland Kitchen PHENobarbital (LUMINAL) 32.4 MG tablet Take 60 mg by mouth at bedtime.     . phenytoin (DILANTIN) 100 MG ER capsule  TAKE 2 CAPSULES BY MOUTH AT BEDTIME (SEIZURE DISORDER)    . phenytoin (DILANTIN) 50 MG tablet Chew 50 mg by mouth at bedtime.    . triamcinolone cream (KENALOG) 0.1 % Apply topically.    . warfarin (COUMADIN) 1 MG tablet TAKE 2 TABLETS (2MG) BY MOUTH ONCE DAILY (ALONG W/ 5MG=7MG TOTAL DOSAGE).    Marland Kitchen warfarin (COUMADIN) 5 MG tablet TAKE 1 TABLET BY MOUTH EVERY DAY (ALONG W/ 2MG=7MG TOTAL DOSAGE).    Marland Kitchen clindamycin (CLEOCIN) 300 MG capsule Take 1 capsule (300 mg total) by mouth 4 (four) times daily. (Patient not taking: Reported on 07/01/2019) 28 capsule 0   No current facility-administered medications on file prior to visit.    No Known Allergies     Observations/Objective: There were no vitals filed for this visit. There is no height or weight on file to calculate BMI.  Physical Exam Neurological:     Mental Status: She is alert.     CBC    Component Value Date/Time   WBC 28.6 (H) 08/06/2019 0901   RBC 4.40 08/06/2019 0901   HGB 9.5 (L) 08/06/2019 0901   HGB 11.6 (L) 08/22/2012 1142   HCT 32.4 (L) 08/06/2019 0901   HCT 37.3 08/22/2012 1142   PLT 182 08/06/2019 0901   PLT 303 08/22/2012 1142   MCV 73.6 (L) 08/06/2019 0901   MCV 68 (L) 08/22/2012 1142   MCH 21.6 (L) 08/06/2019 0901   MCHC 29.3 (L) 08/06/2019 0901   RDW 16.1 (H) 08/06/2019 0901   RDW 16.2 (H) 08/22/2012 1142   LYMPHSABS 23.2 (H) 08/06/2019 0901   LYMPHSABS 10.5 (H) 08/22/2012 1142   MONOABS 1.3 (H) 08/06/2019 0901   MONOABS 1.0 (H) 08/22/2012 1142   EOSABS 0.1 08/06/2019 0901   EOSABS 0.1 08/22/2012 1142   BASOSABS 0.1 08/06/2019 0901   BASOSABS 0.1 08/22/2012 1142    CMP     Component Value Date/Time   NA 139 07/02/2019 1145   NA 139 11/23/2011 1712   K 3.9 07/02/2019 1145   K 4.3 11/23/2011 1712   CL 102 07/02/2019 1145   CL 107 11/23/2011 1712   CO2 27 07/02/2019 1145   CO2 29 11/23/2011 1712   GLUCOSE 91 07/02/2019 1145   GLUCOSE 83 11/23/2011 1712   BUN 14 07/02/2019 1145   BUN 12  11/23/2011 1712   CREATININE 0.69 07/02/2019 1145   CREATININE 0.80 07/17/2012 1123   CALCIUM 8.8 (L) 07/02/2019 1145   CALCIUM 8.9 11/23/2011 1712   PROT 8.6 (H) 07/02/2019 1145   PROT 8.8 (H) 11/23/2011 1712   ALBUMIN 3.5 07/02/2019 1145   ALBUMIN 3.5 11/23/2011 1712  AST 16 07/02/2019 1145   AST 25 11/23/2011 1712   ALT 15 07/02/2019 1145   ALT 20 11/23/2011 1712   ALKPHOS 100 07/02/2019 1145   ALKPHOS 147 (H) 11/23/2011 1712   BILITOT 0.4 07/02/2019 1145   BILITOT 0.1 (L) 11/23/2011 1712   GFRNONAA >60 07/02/2019 1145   GFRNONAA >60 07/17/2012 1123   GFRAA >60 07/02/2019 1145   GFRAA >60 07/17/2012 1123     Assessment and Plan: 1. Lymphoproliferative disorder ([place])   2. Monoclonal gammopathy   3. Microcytic anemia   Bone marrow results were reviewed and discussed with the patient, caregiver as well as patient's legal guardian [name] IgM gammopathy with IgM level at 3366, M protein elevated 2.1 Bone marrow result is positive for marked involvement of low-grade non-Hodgkin B-cell lymphoma, no CD5 or CD10 expression.  Associated population of lambda restricted plasma cells, IgM paraprotein the main differential includes lymphoplasmacytic lymphoma-[place] Strom or marginal zone lymphoma.  Molecular testing showed negative for MYD 88  Will discuss her case on tumor board. Will obtain PET scan.  Given that she has experience unintentional weight loss, and has hemoglobin <10, extensive marrow involvements,  I recommend Rituximab treatment weekly x 4    Follow Up Instructions: TBD   I discussed the assessment and treatment plan with the patient. The patient was provided an opportunity to ask questions and all were answered. The patient agreed with the plan and demonstrated an understanding of the instructions.  The patient was advised to call back or seek an in-person evaluation if the symptoms worsen or if the condition fails to improve as anticipated.    Earlie Server, MD  08/20/2019 6:12 PM

## 2019-08-22 ENCOUNTER — Telehealth: Payer: Self-pay

## 2019-08-22 NOTE — Telephone Encounter (Signed)
Please schedule PET and I will call family/ caregiver to inform of appt.

## 2019-08-22 NOTE — Telephone Encounter (Signed)
Done. PET scan has been sched as requested 09/02/19  @ [facility]-PET CT1 at 8:30 must arrive 48mins prior. NPO 6hrs except water

## 2019-08-22 NOTE — Telephone Encounter (Signed)
-----   Message from Earlie Server, MD sent at 08/22/2019  8:52 AM EDT ----- Please schedule her to get a PET scan. She has lega=l guardian Mr.Graves, phone number in the chart. Thanks.

## 2019-08-22 NOTE — Telephone Encounter (Signed)
[name] (legal guardian) and Laurie Rice (caregiver) notified of appts.

## 2019-08-28 ENCOUNTER — Ambulatory Visit
Admission: RE | Admit: 2019-08-28 | Discharge: 2019-08-28 | Disposition: A | Discharge status: Home / Self Care | Payer: Medicare Other | Source: Ambulatory Visit | Attending: Family Medicine | Admitting: Family Medicine

## 2019-08-28 ENCOUNTER — Other Ambulatory Visit: Payer: Self-pay

## 2019-08-28 DIAGNOSIS — Z1231 Encounter for screening mammogram for malignant neoplasm of breast: Secondary | ICD-10-CM | POA: Insufficient documentation

## 2019-09-02 ENCOUNTER — Encounter
Admission: RE | Admit: 2019-09-02 | Discharge: 2019-09-02 | Disposition: A | Discharge status: Home / Self Care | Payer: Medicare Other | Source: Ambulatory Visit | Attending: Oncology | Admitting: Oncology

## 2019-09-02 ENCOUNTER — Other Ambulatory Visit: Payer: Self-pay

## 2019-09-02 DIAGNOSIS — Q788 Other specified osteochondrodysplasias: Secondary | ICD-10-CM | POA: Insufficient documentation

## 2019-09-02 DIAGNOSIS — I2510 Atherosclerotic heart disease of native coronary artery without angina pectoris: Secondary | ICD-10-CM | POA: Insufficient documentation

## 2019-09-02 DIAGNOSIS — D472 Monoclonal gammopathy: Secondary | ICD-10-CM

## 2019-09-02 DIAGNOSIS — E041 Nontoxic single thyroid nodule: Secondary | ICD-10-CM | POA: Insufficient documentation

## 2019-09-02 DIAGNOSIS — I700 Atherosclerosis of aorta: Secondary | ICD-10-CM | POA: Diagnosis not present

## 2019-09-02 DIAGNOSIS — D479 Neoplasm of uncertain behavior of lymphoid, hematopoietic and related tissue, unspecified: Secondary | ICD-10-CM | POA: Diagnosis present

## 2019-09-02 DIAGNOSIS — D509 Iron deficiency anemia, unspecified: Secondary | ICD-10-CM

## 2019-09-02 LAB — GLUCOSE, CAPILLARY: Glucose-Capillary: 61 mg/dL — ABNORMAL LOW (ref 70–99)

## 2019-09-02 MED ORDER — FLUDEOXYGLUCOSE F - 18 (FDG) INJECTION
9.3000 | Freq: Once | INTRAVENOUS | Status: AC | PRN
  Administered 2019-09-02: 9.39 via INTRAVENOUS

## 2019-10-02 ENCOUNTER — Telehealth: Payer: Self-pay

## 2019-10-02 NOTE — Telephone Encounter (Signed)
Error

## 2019-10-10 ENCOUNTER — Inpatient Hospital Stay: Payer: Medicare Other | Attending: Oncology | Admitting: Oncology

## 2019-12-18 ENCOUNTER — Other Ambulatory Visit: Payer: Self-pay | Admitting: Dermatology

## 2019-12-25 ENCOUNTER — Telehealth: Payer: Self-pay

## 2019-12-25 NOTE — Telephone Encounter (Signed)
Please schedule patient for lab/MD/chemo class Retuximab and call Caregiver Sofie Hartigan [phone] with appt details    Patient did not come to the 10/10/19 appt and Dr. Tasia Catchings would like her scheduled for lab/MD (cbc cmp). she needs Rituximab treatments so needs chemo education. I have previously discussed with her legal guardian. I guess legal guadian needs to attend chemo edu? please schedule chemo class with guadian.    I have spoken to legal guardian Valerie Salts and he agrees with plan but would like for [name] to contact caregiver Sofie Hartigan [phone].

## 2019-12-30 ENCOUNTER — Inpatient Hospital Stay: Payer: Medicare Other

## 2020-01-05 NOTE — Patient Instructions (Incomplete)
Rituximab injection What is this medicine? RITUXIMAB (ri TUX i mab) is a monoclonal antibody. It is used to treat certain types of cancer like non-Hodgkin lymphoma and chronic lymphocytic leukemia. It is also used to treat rheumatoid arthritis, granulomatosis with polyangiitis (or Wegener's granulomatosis), microscopic polyangiitis, and pemphigus vulgaris. This medicine may be used for other purposes; ask your health care provider or pharmacist if you have questions. COMMON BRAND NAME(S): Rituxan, RUXIENCE What should I tell my health care provider before I take this medicine? They need to know if you have any of these conditions:  heart disease  infection (especially a virus infection such as hepatitis B, chickenpox, cold sores, or herpes)  immune system problems  irregular heartbeat  kidney disease  low blood counts, like low white cell, platelet, or red cell counts  lung or breathing disease, like asthma  recently received or scheduled to receive a vaccine  an unusual or allergic reaction to rituximab, other medicines, foods, dyes, or preservatives  pregnant or trying to get pregnant  breast-feeding How should I use this medicine? This medicine is for infusion into a vein. It is administered in a hospital or clinic by a specially trained health care professional. A special MedGuide will be given to you by the pharmacist with each prescription and refill. Be sure to read this information carefully each time. Talk to your pediatrician regarding the use of this medicine in children. This medicine is not approved for use in children. Overdosage: If you think you have taken too much of this medicine contact a poison control center or emergency room at once. NOTE: This medicine is only for you. Do not share this medicine with others. What if I miss a dose? It is important not to miss a dose. Call your doctor or health care professional if you are unable to keep an appointment. What  may interact with this medicine?  cisplatin  live virus vaccines This list may not describe all possible interactions. Give your health care provider a list of all the medicines, herbs, non-prescription drugs, or dietary supplements you use. Also tell them if you smoke, drink alcohol, or use illegal drugs. Some items may interact with your medicine. What should I watch for while using this medicine? Your condition will be monitored carefully while you are receiving this medicine. You may need blood work done while you are taking this medicine. This medicine can cause serious allergic reactions. To reduce your risk you may need to take medicine before treatment with this medicine. Take your medicine as directed. In some patients, this medicine may cause a serious brain infection that may cause death. If you have any problems seeing, thinking, speaking, walking, or standing, tell your healthcare professional right away. If you cannot reach your healthcare professional, urgently seek other source of medical care. Call your doctor or health care professional for advice if you get a fever, chills or sore throat, or other symptoms of a cold or flu. Do not treat yourself. This drug decreases your body's ability to fight infections. Try to avoid being around people who are sick. Do not become pregnant while taking this medicine or for at least 12 months after stopping it. Women should inform their doctor if they wish to become pregnant or think they might be pregnant. There is a potential for serious side effects to an unborn child. Talk to your health care professional or pharmacist for more information. Do not breast-feed an infant while taking this medicine or for at   least 6 months after stopping it. What side effects may I notice from receiving this medicine? Side effects that you should report to your doctor or health care professional as soon as possible:  allergic reactions like skin rash, itching or  hives; swelling of the face, lips, or tongue  breathing problems  chest pain  changes in vision  diarrhea  headache with fever, neck stiffness, sensitivity to light, nausea, or confusion  fast, irregular heartbeat  loss of memory  low blood counts - this medicine may decrease the number of white blood cells, red blood cells and platelets. You may be at increased risk for infections and bleeding.  mouth sores  problems with balance, talking, or walking  redness, blistering, peeling or loosening of the skin, including inside the mouth  signs of infection - fever or chills, cough, sore throat, pain or difficulty passing urine  signs and symptoms of kidney injury like trouble passing urine or change in the amount of urine  signs and symptoms of liver injury like dark yellow or brown urine; general ill feeling or flu-like symptoms; light-colored stools; loss of appetite; nausea; right upper belly pain; unusually weak or tired; yellowing of the eyes or skin  signs and symptoms of low blood pressure like dizziness; feeling faint or lightheaded, falls; unusually weak or tired  stomach pain  swelling of the ankles, feet, hands  unusual bleeding or bruising  vomiting Side effects that usually do not require medical attention (report to your doctor or health care professional if they continue or are bothersome):  headache  joint pain  muscle cramps or muscle pain  nausea  tiredness This list may not describe all possible side effects. Call your doctor for medical advice about side effects. You may report side effects to FDA at 1-800-FDA-1088. Where should I keep my medicine? This drug is given in a hospital or clinic and will not be stored at home. NOTE: This sheet is a summary. It may not cover all possible information. If you have questions about this medicine, talk to your doctor, pharmacist, or health care provider.  2020 Elsevier/Gold Standard (2018-01-30  22:01:36)  

## 2020-01-06 ENCOUNTER — Inpatient Hospital Stay: Payer: Medicare Other | Attending: Oncology

## 2020-01-06 DIAGNOSIS — E079 Disorder of thyroid, unspecified: Secondary | ICD-10-CM | POA: Insufficient documentation

## 2020-01-06 DIAGNOSIS — Z5112 Encounter for antineoplastic immunotherapy: Secondary | ICD-10-CM | POA: Insufficient documentation

## 2020-01-06 DIAGNOSIS — C8510 Unspecified B-cell lymphoma, unspecified site: Secondary | ICD-10-CM | POA: Insufficient documentation

## 2020-01-06 DIAGNOSIS — D509 Iron deficiency anemia, unspecified: Secondary | ICD-10-CM | POA: Insufficient documentation

## 2020-01-09 ENCOUNTER — Other Ambulatory Visit: Payer: Self-pay | Admitting: Oncology

## 2020-01-09 DIAGNOSIS — C8590 Non-Hodgkin lymphoma, unspecified, unspecified site: Secondary | ICD-10-CM | POA: Insufficient documentation

## 2020-01-09 NOTE — Progress Notes (Signed)
START OFF PATHWAY REGIMEN - Lymphoma and CLL   [id number]:Rituximab (Weekly):   Administer weekly:     Rituximab-xxxx   **Always confirm dose/schedule in your pharmacy ordering system**  Patient Characteristics: Disease Type: Not Applicable Disease Type: Not Applicable Disease Type: Not Applicable Intent of Therapy: Non-Curative / Palliative Intent, Discussed with Patient 

## 2020-01-14 ENCOUNTER — Inpatient Hospital Stay: Payer: Medicare Other

## 2020-01-14 ENCOUNTER — Other Ambulatory Visit: Payer: Self-pay | Admitting: Oncology

## 2020-01-14 ENCOUNTER — Other Ambulatory Visit: Payer: Self-pay

## 2020-01-14 ENCOUNTER — Encounter: Payer: Self-pay | Admitting: Oncology

## 2020-01-14 ENCOUNTER — Inpatient Hospital Stay (HOSPITAL_BASED_OUTPATIENT_CLINIC_OR_DEPARTMENT_OTHER): Payer: Medicare Other | Admitting: Oncology

## 2020-01-14 VITALS — BP 118/77 | HR 60 | Temp 97.0°F | Resp 16 | Wt 181.9 lb

## 2020-01-14 DIAGNOSIS — D509 Iron deficiency anemia, unspecified: Secondary | ICD-10-CM | POA: Diagnosis not present

## 2020-01-14 DIAGNOSIS — C8590 Non-Hodgkin lymphoma, unspecified, unspecified site: Secondary | ICD-10-CM | POA: Diagnosis not present

## 2020-01-14 DIAGNOSIS — C8510 Unspecified B-cell lymphoma, unspecified site: Secondary | ICD-10-CM | POA: Diagnosis present

## 2020-01-14 DIAGNOSIS — Z5112 Encounter for antineoplastic immunotherapy: Secondary | ICD-10-CM | POA: Diagnosis present

## 2020-01-14 DIAGNOSIS — E079 Disorder of thyroid, unspecified: Secondary | ICD-10-CM | POA: Diagnosis not present

## 2020-01-14 DIAGNOSIS — D759 Disease of blood and blood-forming organs, unspecified: Secondary | ICD-10-CM | POA: Diagnosis not present

## 2020-01-14 LAB — COMPREHENSIVE METABOLIC PANEL
ALT: 13 U/L (ref 0–44)
AST: 19 U/L (ref 15–41)
Albumin: 3.3 g/dL — ABNORMAL LOW (ref 3.5–5.0)
Alkaline Phosphatase: 97 U/L (ref 38–126)
Anion gap: 7 (ref 5–15)
BUN: 16 mg/dL (ref 6–20)
CO2: 25 mmol/L (ref 22–32)
Calcium: 8.7 mg/dL — ABNORMAL LOW (ref 8.9–10.3)
Chloride: 104 mmol/L (ref 98–111)
Creatinine, Ser: 0.66 mg/dL (ref 0.44–1.00)
GFR, Estimated: >60 mL/min (ref >60)
Glucose, Bld: 113 mg/dL — ABNORMAL HIGH (ref 70–99)
Potassium: 3.6 mmol/L (ref 3.5–5.1)
Sodium: 136 mmol/L (ref 135–145)
Total Bilirubin: 0.2 mg/dL — ABNORMAL LOW (ref 0.3–1.2)
Total Protein: 7.8 g/dL (ref 6.5–8.1)

## 2020-01-14 LAB — CBC WITH DIFFERENTIAL/PLATELET
Abs Immature Granulocytes: 0.05 10*3/uL (ref 0.00–0.07)
Basophils Absolute: 0.1 10*3/uL (ref 0.0–0.1)
Basophils Relative: 0 %
Eosinophils Absolute: 0.1 10*3/uL (ref 0.0–0.5)
Eosinophils Relative: 0 %
HCT: 30.9 % — ABNORMAL LOW (ref 36.0–46.0)
Hemoglobin: 9.3 g/dL — ABNORMAL LOW (ref 12.0–15.0)
Immature Granulocytes: 0 %
Lymphocytes Relative: 87 %
Lymphs Abs: 28.3 10*3/uL — ABNORMAL HIGH (ref 0.7–4.0)
MCH: 21.6 pg — ABNORMAL LOW (ref 26.0–34.0)
MCHC: 30.1 g/dL (ref 30.0–36.0)
MCV: 71.7 fL — ABNORMAL LOW (ref 80.0–100.0)
Monocytes Absolute: 1.7 10*3/uL — ABNORMAL HIGH (ref 0.1–1.0)
Monocytes Relative: 5 %
Neutro Abs: 2.6 10*3/uL (ref 1.7–7.7)
Neutrophils Relative %: 8 %
Platelets: 181 10*3/uL (ref 150–400)
RBC: 4.31 MIL/uL (ref 3.87–5.11)
RDW: 17.2 % — ABNORMAL HIGH (ref 11.5–15.5)
Smear Review: NORMAL
WBC Morphology: ABNORMAL
WBC: 32.8 10*3/uL — ABNORMAL HIGH (ref 4.0–10.5)
nRBC: 0 % (ref 0.0–0.2)

## 2020-01-14 LAB — LACTATE DEHYDROGENASE: LDH: 114 U/L (ref 98–192)

## 2020-01-14 NOTE — Progress Notes (Signed)
Patient here for follow up. She has a sore on her on her right breast that has been treated by her PCP.She has had several blisters over the past few months. She was treated with ABT.

## 2020-01-14 NOTE — Progress Notes (Signed)
Hematology/Oncology note [facility] [phone] [phone]   Patient Care Team: Juluis Pitch, MD as PCP - General (Family Medicine)  REFERRING PROVIDER: Juluis Pitch, MD  CHIEF COMPLAINTS/REASON FOR VISIT:  Follow-up for lymphoma  HISTORY OF PRESENTING ILLNESS:  Laurie Rice is a  60 y.o.  female with PMH listed below follows up for lymphoma 06/10/2017 CBC showed elevated white count of 38.5, hemoglobin 10.2, MCV 74.7, absolute lymphocyte 30.4, basophil 0.13, Previous lab records reviewed. Leukocytosis onset of chronic, duration is since at least 2014 Anemia is also chronic since at least 2016 No aggravating or elevated factors. Patient is a poor historian.  She has a seizure disorder and meningioma.  She is not medical competent and has legal guardian.  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    Associated symptoms or signs:  Denies weight loss, fever, chills,night sweats.  Recent history of weight loss about 10 pounds over the past 1 year.  Smoking history: Never smoking History of recent oral steroid use or steroid injection: Denies History of recent infection: Denies Autoimmune disease history.  Denies  Reviewed patient's past medical history, was previously seen by Dr. Mike Gip with last visit more than 3 years ago.  Today she wants to reestablish care at [facility] site. 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  Phenotype did not suggest a specific histologic type and was not typical for CLL/SLL, mantle cell lymphoma, hairy cell leukemia or follicular cell lymphoma.  BCR ABL testing was negative.  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  Chronic microcytic anemia since 2008.  She had hemoglobin electrophoresis reviewed normal hemoglobin F and A2 Patient has a history of bilateral DVT  and pulmonary embolism in 2010.  Has been on Coumadin chronically She developed right lower extremity DVT on 07/09/2014 secondary to subtherapeutic INR   INTERVAL HISTORY Laurie Rice is a 60 y.o. female who has above history reviewed by me today presents for follow up visit for lymphoma. Problems and complaints are listed below: Patient was accompanied by caregiver today.  Per caregiver, patient has good appetite, weight has been stable.  Patient reports slightly fatigued.  Otherwise no new complaints. She was last seen by me in August 2021 and then no showed for the follow-up appointment.  Review of Systems  Unable to perform ROS: Other (Intellectual disability)  Constitutional: Positive for fatigue and unexpected weight change.     MEDICAL HISTORY:  Past Medical History:  Diagnosis Date  . Anemia    chronic, mcv chronically low  . Cellulitis    History of  . 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 History of lymphocytosis   . Impetigo    History of  . Liver masses    History of, Benign  . Meningioma ([place])   . Mild mental retardation   . Monoclonal gammopathy    low level  . Personal history of pulmonary embolism   . Seizures ([place])   . Thyroid mass    history of    SURGICAL HISTORY: Past Surgical History:  Procedure Laterality Date  . NO PAST SURGERIES      SOCIAL HISTORY: Social History   Socioeconomic History  . Marital status: Single    Spouse name: Not on file  . Number of children: Not on file  . Years of education: Not  on file  . Highest education level: Not on file  Occupational History  . Not on file  Tobacco Use  . Smoking status: Never Smoker  . Smokeless tobacco: Never Used  Substance and Sexual Activity  . Alcohol use: No  . Drug use: No  . Sexual activity: Not on file  Other Topics Concern  . Not on file  Social History Narrative  . Not on file   Social Determinants of Health   Financial Resource Strain:  Not on file  Food Insecurity: Not on file  Transportation Needs: Not on file  Physical Activity: Not on file  Stress: Not on file  Social Connections: Not on file  Intimate Partner Violence: Not on file    FAMILY HISTORY: Family History  Problem Relation Age of Onset  . Hypertension Other   . Arthritis Other   . Breast cancer Neg Hx     ALLERGIES:  has No Known Allergies.  MEDICATIONS:  Current Outpatient Medications  Medication Sig Dispense Refill  . bisoprolol-hydrochlorothiazide (ZIAC) 2.5-6.25 MG tablet Take 2 tablets by mouth daily.    . Emollient (CETAPHIL) cream APPLY SMALL AMOUNT TO AFFECTED AREAS ONCE DAILY    . ferrous sulfate 325 (65 FE) MG EC tablet Take 1 tablet (325 mg total) by mouth daily with breakfast. With orange juice 30 tablet 3  . folic acid (FOLVITE) 1 MG tablet TAKE ONE TABLET BY MOUTH EACH DAY.    Marland Kitchen PHENobarbital (LUMINAL) 32.4 MG tablet Take 60 mg by mouth at bedtime.     . phenytoin (DILANTIN) 100 MG ER capsule TAKE 2 CAPSULES BY MOUTH AT BEDTIME (SEIZURE DISORDER)    . phenytoin (DILANTIN) 50 MG tablet Chew 50 mg by mouth at bedtime.    . triamcinolone (KENALOG) 0.1 % APPLY TO RIGHT LOWER LEG ONCE DAILY AS NEEDED FOR FLARES (AVOID FACE, GROIN, AXILLA) 80 g 1  . warfarin (COUMADIN) 1 MG tablet TAKE 2 TABLETS (2MG) BY MOUTH ONCE DAILY (ALONG W/ 5MG=7MG TOTAL DOSAGE).    Marland Kitchen warfarin (COUMADIN) 5 MG tablet TAKE 1 TABLET BY MOUTH EVERY DAY (ALONG W/ 2MG=7MG TOTAL DOSAGE).     No current facility-administered medications for this visit.     PHYSICAL EXAMINATION: ECOG PERFORMANCE STATUS: 1 - Symptomatic but completely ambulatory Vitals:   01/14/20 0920  BP: 118/77  Pulse: 60  Resp: 16  Temp: (!) 97 F (36.1 C)   Filed Weights   01/14/20 0920  Weight: 181 lb 14.4 oz (82.5 kg)    Physical Exam Constitutional:      General: She is not in acute distress. HENT:     Head: Normocephalic and atraumatic.  Eyes:     General: No scleral  icterus. Cardiovascular:     Rate and Rhythm: Normal rate and regular rhythm.     Heart sounds: Normal heart sounds.  Pulmonary:     Effort: Pulmonary effort is normal. No respiratory distress.     Breath sounds: No wheezing.  Abdominal:     General: Bowel sounds are normal. There is no distension.     Palpations: Abdomen is soft.  Musculoskeletal:        General: No deformity. Normal range of motion.     Cervical back: Normal range of motion and neck supple.  Skin:    General: Skin is warm and dry.     Findings: No erythema or rash.  Neurological:     Mental Status: She is alert. Mental status is at baseline.  Psychiatric:  Mood and Affect: Mood normal.     CMP Latest Ref Rng & Units 01/14/2020  Glucose 70 - 99 mg/dL 113(H)  BUN 6 - 20 mg/dL 16  Creatinine 0.44 - 1.00 mg/dL 0.66  Sodium 135 - 145 mmol/L 136  Potassium 3.5 - 5.1 mmol/L 3.6  Chloride 98 - 111 mmol/L 104  CO2 22 - 32 mmol/L 25  Calcium 8.9 - 10.3 mg/dL 8.7(L)  Total Protein 6.5 - 8.1 g/dL 7.8  Total Bilirubin 0.3 - 1.2 mg/dL 0.2(L)  Alkaline Phos 38 - 126 U/L 97  AST 15 - 41 U/L 19  ALT 0 - 44 U/L 13   CBC Latest Ref Rng & Units 01/14/2020  WBC 4.0 - 10.5 K/uL 32.8(H)  Hemoglobin 12.0 - 15.0 g/dL 9.3(L)  Hematocrit 36.0 - 46.0 % 30.9(L)  Platelets 150 - 400 K/uL 181     RADIOGRAPHIC STUDIES: I have personally reviewed the radiological images as listed and agreed with the findings in the report. No results found.  LABORATORY DATA:  I have reviewed the data as listed Lab Results  Component Value Date   WBC 32.8 (H) 01/14/2020   HGB 9.3 (L) 01/14/2020   HCT 30.9 (L) 01/14/2020   MCV 71.7 (L) 01/14/2020   PLT 181 01/14/2020   Recent Labs    07/02/19 1145 01/14/20 0906  NA 139 136  K 3.9 3.6  CL 102 104  CO2 27 25  GLUCOSE 91 113*  BUN 14 16  CREATININE 0.69 0.66  CALCIUM 8.8* 8.7*  GFRNONAA >60 >60  GFRAA >60  --   PROT 8.6* 7.8  ALBUMIN 3.5 3.3*  AST 16 19  ALT 15 13   ALKPHOS 100 97  BILITOT 0.4 0.2*   Iron/TIBC/Ferritin/ %Sat    Component Value Date/Time   IRON 66 11/09/2014 1455   IRON 94 08/22/2012 1142   TIBC 225 (L) 11/09/2014 1455   TIBC 221 (L) 08/22/2012 1142   FERRITIN 62 09/07/2015 0957   FERRITIN 65 08/22/2012 1142   IRONPCTSAT 29 11/09/2014 1455   IRONPCTSAT 43 08/22/2012 1142        ASSESSMENT & PLAN:  1. Low grade malignant lymphoma ([place])   2. Microcytic anemia   3. 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 Bone marrow biopsy reveals marked involvement of lymphoma. MYD 88 negative,  Cytogenetics showed translocation (4;6) Repeat SPEP, immunofixation, immunoglobulin level, viscosity, LDH, CBC, CMP today. Labs are reviewed and discussed with patient. Bone marrow is marked involvement of lymphoma, anemia with hemoglobin less than 10, gradually worsening.  Viscosity is now elevated at 2.3. IgM is 3135.  I called patient's legal guardian Mr.Graves and discussed with him. I recommend weekly Rituximab x 4 treatment. Discussed in details about the rationale and possible side effects. Mr.Graves voices understanding and gives permission to treat patient.  Chemo Education -Rituxmiab with legal guardian.    Chronic microcytic anemia, previous hemoglobinopathy showed normal hemoglobin.  Her previous work-up showed normal ferritin and iron saturation.  She probably has alpha thalassemia.  We can confirm it in the future.  Orders Placed This Encounter  Procedures  . CBC with Differential/Platelet    Standing Status:   Future    Standing Expiration Date:   01/19/2021  . Comprehensive metabolic panel    Standing Status:   Future    Standing Expiration Date:   01/19/2021  . Hgb Fractionation Cascade    Standing Status:  Future    Standing Expiration Date:   01/19/2021    All questions were answered. The patient knows to call the clinic with any  problems questions or concerns.  Return of visit: 1-2 weeks to start treatment.   Earlie Server, MD, PhD Hematology Oncology [facility] Pager- [phone] 01/14/2020

## 2020-01-15 LAB — KAPPA/LAMBDA LIGHT CHAINS
Kappa free light chain: 8.9 mg/L (ref 3.3–19.4)
Kappa, lambda light chain ratio: 0.09 — ABNORMAL LOW (ref 0.26–1.65)
Lambda free light chains: 97.7 mg/L — ABNORMAL HIGH (ref 5.7–26.3)

## 2020-01-16 LAB — MULTIPLE MYELOMA PANEL, SERUM
Albumin SerPl Elph-Mcnc: 3.5 g/dL (ref 2.9–4.4)
Albumin/Glob SerPl: 0.9 (ref 0.7–1.7)
Alpha 1: 0.2 g/dL (ref 0.0–0.4)
Alpha2 Glob SerPl Elph-Mcnc: 0.7 g/dL (ref 0.4–1.0)
B-Globulin SerPl Elph-Mcnc: 0.5 g/dL — ABNORMAL LOW (ref 0.7–1.3)
Gamma Glob SerPl Elph-Mcnc: 2.6 g/dL — ABNORMAL HIGH (ref 0.4–1.8)
Globulin, Total: 4.1 g/dL — ABNORMAL HIGH (ref 2.2–3.9)
IgA: 73 mg/dL — ABNORMAL LOW (ref 87–352)
IgG (Immunoglobin G), Serum: 729 mg/dL (ref 586–1602)
IgM (Immunoglobulin M), Srm: 3135 mg/dL — ABNORMAL HIGH (ref 26–217)
M Protein SerPl Elph-Mcnc: 1.8 g/dL — ABNORMAL HIGH
Total Protein ELP: 7.6 g/dL (ref 6.0–8.5)

## 2020-01-16 LAB — VISCOSITY, SERUM: Viscosity, Serum: 2.3 rel.saline — ABNORMAL HIGH (ref 1.4–2.1)

## 2020-01-18 LAB — CRYOGLOBULIN

## 2020-01-21 ENCOUNTER — Telehealth: Payer: Self-pay

## 2020-01-21 DIAGNOSIS — C8590 Non-Hodgkin lymphoma, unspecified, unspecified site: Secondary | ICD-10-CM

## 2020-01-21 NOTE — Telephone Encounter (Signed)
-----   Message from Earlie Server, MD sent at 01/20/2020 10:11 AM EST ----- Can you check if her legal guardian can do virtual chemo education for rituximab?  If yes, please arrange. And arrange pt to do see me lab cbc cmp rituximab thanks.

## 2020-01-21 NOTE — Telephone Encounter (Signed)
Please contact Legal guardian Quillian Quince [phone]) and schedule him to do virtual chemo education for rituximab, per MD request. Schedule patient for lab/MD/rituximab *new* after legal guardian has done chemo edu. Notify caregiver Jobe Marker) of new tx appts once scheduled.

## 2020-01-22 NOTE — Telephone Encounter (Signed)
FYI...  I called pts Legal guardian Quillian Quince and schedule him to do virtual chemo education as requested, Then I reached out to Caregiver Jobe Marker) to get pt sched for lab/MD/*NEW* Rituximab per MD....Marland Kitchenfor nx week. Caregiver asked about the Chemo edu class, she was made aware of the fact the Quillian Quince would to doing the class, at that point she got upset asking why was he set up to do the class because the pt doesn't live with him. At that point she said not to sched anything yet and wants someone to call her back to explain to her why she's not the one taking the class because the pt lives with her. She requesting to have someone call back.  Please Advise

## 2020-01-23 ENCOUNTER — Other Ambulatory Visit: Payer: Medicare Other

## 2020-01-26 NOTE — Patient Instructions (Addendum)
Rituximab Injection What is this medicine? RITUXIMAB (ri TUX i mab) is a monoclonal antibody. It is used to treat certain types of cancer like non-Hodgkin lymphoma and chronic lymphocytic leukemia. It is also used to treat rheumatoid arthritis, granulomatosis with polyangiitis, microscopic polyangiitis, and pemphigus vulgaris. This medicine may be used for other purposes; ask your health care provider or pharmacist if you have questions. COMMON BRAND NAME(S): RIABNI, Rituxan, RUXIENCE What should I tell my health care provider before I take this medicine? They need to know if you have any of these conditions:  chest pain  heart disease  infection especially a viral infection such as chickenpox, cold sores, hepatitis B, or herpes  immune system problems  irregular heartbeat or rhythm  kidney disease  low blood counts (white cells, platelets, or red cells)  lung disease  recent or upcoming vaccine  an unusual or allergic reaction to rituximab, other medicines, foods, dyes, or preservatives  pregnant or trying to get pregnant  breast-feeding How should I use this medicine? This medicine is injected into a vein. It is given by a health care provider in a hospital or clinic setting. A special MedGuide will be given to you before each treatment. Be sure to read this information carefully each time. Talk to your health care provider about the use of this medicine in children. While this drug may be prescribed for children as young as 2 years for selected conditions, precautions do apply. Overdosage: If you think you have taken too much of this medicine contact a poison control center or emergency room at once. NOTE: This medicine is only for you. Do not share this medicine with others. What if I miss a dose? Keep appointments for follow-up doses. It is important not to miss your dose. Call your health care provider if you are unable to keep an appointment. What may interact with this  medicine? Do not take this medicine with any of the following medicines:  live vaccines This medicine may also interact with the following medicines:  cisplatin This list may not describe all possible interactions. Give your health care provider a list of all the medicines, herbs, non-prescription drugs, or dietary supplements you use. Also tell them if you smoke, drink alcohol, or use illegal drugs. Some items may interact with your medicine. What should I watch for while using this medicine? Your condition will be monitored carefully while you are receiving this medicine. You may need blood work done while you are taking this medicine. This medicine can cause serious infusion reactions. To reduce the risk your health care provider may give you other medicines to take before receiving this one. Be sure to follow the directions from your health care provider. This medicine may increase your risk of getting an infection. Call your health care provider for advice if you get a fever, chills, sore throat, or other symptoms of a cold or flu. Do not treat yourself. Try to avoid being around people who are sick. Call your health care provider if you are around anyone with measles, chickenpox, or if you develop sores or blisters that do not heal properly. Avoid taking medicines that contain aspirin, acetaminophen, ibuprofen, naproxen, or ketoprofen unless instructed by your health care provider. These medicines may hide a fever. This medicine may cause serious skin reactions. They can happen weeks to months after starting the medicine. Contact your health care provider right away if you notice fevers or flu-like symptoms with a rash. The rash may be red   or purple and then turn into blisters or peeling of the skin. Or, you might notice a red rash with swelling of the face, lips or lymph nodes in your neck or under your arms. In some patients, this medicine may cause a serious brain infection that may cause  death. If you have any problems seeing, thinking, speaking, walking, or standing, tell your healthcare professional right away. If you cannot reach your healthcare professional, urgently seek other source of medical care. Do not become pregnant while taking this medicine or for at least 12 months after stopping it. Women should inform their health care provider if they wish to become pregnant or think they might be pregnant. There is potential for serious harm to an unborn child. Talk to your health care provider for more information. Women should use a reliable form of birth control while taking this medicine and for 12 months after stopping it. Do not breast-feed while taking this medicine or for at least 6 months after stopping it. What side effects may I notice from receiving this medicine? Side effects that you should report to your health care provider as soon as possible:  allergic reactions (skin rash, itching or hives; swelling of the face, lips, or tongue)  diarrhea  edema (sudden weight gain; swelling of the ankles, feet, hands or other unusual swelling; trouble breathing)  fast, irregular heartbeat  heart attack (trouble breathing; pain or tightness in the chest, neck, back or arms; unusually weak or tired)  infection (fever, chills, cough, sore throat, pain or trouble passing urine)  kidney injury (trouble passing urine or change in the amount of urine)  liver injury (dark yellow or brown urine; general ill feeling or flu-like symptoms; loss of appetite, right upper belly pain; unusually weak or tired, yellowing of the eyes or skin)  low blood pressure (dizziness; feeling faint or lightheaded, falls; unusually weak or tired)  low red blood cell counts (trouble breathing; feeling faint; lightheaded, falls; unusually weak or tired)  mouth sores  redness, blistering, peeling, or loosening of the skin, including inside the mouth  stomach pain  unusual bruising or  bleeding  wheezing (trouble breathing with loud or whistling sounds)  vomiting Side effects that usually do not require medical attention (report to your health care provider if they continue or are bothersome):  headache  joint pain  muscle cramps, pain  nausea This list may not describe all possible side effects. Call your doctor for medical advice about side effects. You may report side effects to FDA at 1-800-FDA-1088. Where should I keep my medicine? This medicine is given in a hospital or clinic. It will not be stored at home. NOTE: This sheet is a summary. It may not cover all possible information. If you have questions about this medicine, talk to your doctor, pharmacist, or health care provider.  2021 Elsevier/Gold Standard (2019-10-02 21:35:50)

## 2020-01-27 ENCOUNTER — Inpatient Hospital Stay: Payer: Medicare Other

## 2020-01-27 ENCOUNTER — Telehealth: Payer: Self-pay

## 2020-01-27 NOTE — Telephone Encounter (Signed)
Attempted to do a Virtual visit with guardian Quillian Quince and caregiver Elie Confer.   Elie Confer states she thought the Virtual visit started at 9:00 this morning.  Quillian Quince did not join in the visit.  We kept getting disconnected and after 30 minutes we decided when the patient comes in on Friday at 8:00 am to see MD, the caregiver and guardian can come to chemo class starting at 9:00 in person to receive the New Patient Education packet and class.

## 2020-01-27 NOTE — Telephone Encounter (Signed)
T/C to Laurie Rice, the guardian, and I did the New Patient Education with him.   See Flowsheet.   I will also teach the caregiver Laurie Rice in person on Friday and give her a separate personalized binder so they both will have the New Patient Education binder.

## 2020-01-27 NOTE — Telephone Encounter (Signed)
Mosie Epstein, RN spoke to [employer] who stated that there is a resident in same facility as Mr. Marrin that has tested positive for COVID.  Called and spoke to Benham (caregiver at facility) who stated that patient did a home COVID test yesterday with negative result.

## 2020-01-27 NOTE — Telephone Encounter (Signed)
Dr. Tasia Catchings approves the home COVID neg test result.  Advised Joy to contact the office if patient develops COVID symptoms.

## 2020-01-30 ENCOUNTER — Encounter: Payer: Self-pay | Admitting: Oncology

## 2020-01-30 ENCOUNTER — Inpatient Hospital Stay (HOSPITAL_BASED_OUTPATIENT_CLINIC_OR_DEPARTMENT_OTHER): Payer: Medicare Other | Admitting: Oncology

## 2020-01-30 ENCOUNTER — Other Ambulatory Visit: Payer: Self-pay

## 2020-01-30 ENCOUNTER — Inpatient Hospital Stay: Payer: Medicare Other

## 2020-01-30 VITALS — BP 141/61 | HR 69 | Temp 97.2°F | Wt 187.6 lb

## 2020-01-30 VITALS — BP 133/75 | HR 68 | Temp 99.2°F | Resp 17

## 2020-01-30 DIAGNOSIS — C8510 Unspecified B-cell lymphoma, unspecified site: Secondary | ICD-10-CM

## 2020-01-30 DIAGNOSIS — C8590 Non-Hodgkin lymphoma, unspecified, unspecified site: Secondary | ICD-10-CM | POA: Diagnosis not present

## 2020-01-30 DIAGNOSIS — R197 Diarrhea, unspecified: Secondary | ICD-10-CM

## 2020-01-30 DIAGNOSIS — D759 Disease of blood and blood-forming organs, unspecified: Secondary | ICD-10-CM

## 2020-01-30 DIAGNOSIS — D509 Iron deficiency anemia, unspecified: Secondary | ICD-10-CM

## 2020-01-30 DIAGNOSIS — Z5111 Encounter for antineoplastic chemotherapy: Secondary | ICD-10-CM | POA: Diagnosis not present

## 2020-01-30 DIAGNOSIS — Z5112 Encounter for antineoplastic immunotherapy: Secondary | ICD-10-CM | POA: Diagnosis not present

## 2020-01-30 LAB — COMPREHENSIVE METABOLIC PANEL
ALT: 13 U/L (ref 0–44)
AST: 16 U/L (ref 15–41)
Albumin: 2.9 g/dL — ABNORMAL LOW (ref 3.5–5.0)
Alkaline Phosphatase: 78 U/L (ref 38–126)
Anion gap: 9 (ref 5–15)
BUN: 14 mg/dL (ref 6–20)
CO2: 24 mmol/L (ref 22–32)
Calcium: 8.3 mg/dL — ABNORMAL LOW (ref 8.9–10.3)
Chloride: 106 mmol/L (ref 98–111)
Creatinine, Ser: 0.54 mg/dL (ref 0.44–1.00)
GFR, Estimated: >60 mL/min (ref >60)
Glucose, Bld: 95 mg/dL (ref 70–99)
Potassium: 3.7 mmol/L (ref 3.5–5.1)
Sodium: 139 mmol/L (ref 135–145)
Total Bilirubin: 0.3 mg/dL (ref 0.3–1.2)
Total Protein: 7.3 g/dL (ref 6.5–8.1)

## 2020-01-30 LAB — CBC WITH DIFFERENTIAL/PLATELET
Abs Immature Granulocytes: 0.05 10*3/uL (ref 0.00–0.07)
Basophils Absolute: 0.1 10*3/uL (ref 0.0–0.1)
Basophils Relative: 0 %
Eosinophils Absolute: 0.1 10*3/uL (ref 0.0–0.5)
Eosinophils Relative: 0 %
HCT: 30.2 % — ABNORMAL LOW (ref 36.0–46.0)
Hemoglobin: 9.1 g/dL — ABNORMAL LOW (ref 12.0–15.0)
Immature Granulocytes: 0 %
Lymphocytes Relative: 82 %
Lymphs Abs: 25.1 10*3/uL — ABNORMAL HIGH (ref 0.7–4.0)
MCH: 21.5 pg — ABNORMAL LOW (ref 26.0–34.0)
MCHC: 30.1 g/dL (ref 30.0–36.0)
MCV: 71.4 fL — ABNORMAL LOW (ref 80.0–100.0)
Monocytes Absolute: 1.9 10*3/uL — ABNORMAL HIGH (ref 0.1–1.0)
Monocytes Relative: 6 %
Neutro Abs: 3.8 10*3/uL (ref 1.7–7.7)
Neutrophils Relative %: 12 %
Platelets: 237 10*3/uL (ref 150–400)
RBC: 4.23 MIL/uL (ref 3.87–5.11)
RDW: 17.2 % — ABNORMAL HIGH (ref 11.5–15.5)
Smear Review: NORMAL
WBC Morphology: ABNORMAL
WBC: 31.1 10*3/uL — ABNORMAL HIGH (ref 4.0–10.5)
nRBC: 0 % (ref 0.0–0.2)

## 2020-01-30 LAB — PATHOLOGIST SMEAR REVIEW

## 2020-01-30 MED ORDER — SODIUM CHLORIDE 0.9 % IV SOLN
375.0000 mg/m2 | Freq: Once | INTRAVENOUS | Status: AC
  Administered 2020-01-30: 700 mg via INTRAVENOUS
  Filled 2020-01-30: qty 50

## 2020-01-30 MED ORDER — SODIUM CHLORIDE 0.9 % IV SOLN
Freq: Once | INTRAVENOUS | Status: AC
  Filled 2020-01-30: qty 250

## 2020-01-30 MED ORDER — SODIUM CHLORIDE 0.9 % IV SOLN
Freq: Once | INTRAVENOUS | Status: DC | PRN
  Filled 2020-01-30: qty 250

## 2020-01-30 MED ORDER — ACETAMINOPHEN 325 MG PO TABS
650.0000 mg | ORAL_TABLET | Freq: Once | ORAL | Status: AC
  Administered 2020-01-30: 650 mg via ORAL
  Filled 2020-01-30: qty 2

## 2020-01-30 MED ORDER — DIPHENHYDRAMINE HCL 25 MG PO CAPS
50.0000 mg | ORAL_CAPSULE | Freq: Once | ORAL | Status: AC
  Administered 2020-01-30: 50 mg via ORAL
  Filled 2020-01-30: qty 2

## 2020-01-30 MED ORDER — DIPHENOXYLATE-ATROPINE 2.5-0.025 MG PO TABS
1.0000 | ORAL_TABLET | Freq: Once | ORAL | Status: AC
  Administered 2020-01-30: 1 via ORAL
  Filled 2020-01-30: qty 1

## 2020-01-30 MED ORDER — SODIUM CHLORIDE 0.9 % IV SOLN
10.0000 mg | Freq: Once | INTRAVENOUS | Status: AC
  Administered 2020-01-30: 10 mg via INTRAVENOUS
  Filled 2020-01-30: qty 10

## 2020-01-30 NOTE — Progress Notes (Signed)
1130: approx 45 minutes spent in the BR with staff assist, stomach is upset, pt had at least two episodes of diarrhea, pt states that "it could be what I had for breakfast".  Upon returning to chair, pt appears to be shivering and states "I am cold". Rituximab paused and extra blankets given to pt. Faythe Casa NP aware.  1135: Faythe Casa NP at chairside, pt at baseline. Per Faythe Casa NP restart Rituximab at the second rate and continue to monitor.   1307: B/P 81/33, Pt continues to deny any s/s and no s/s of distress noted. At this time pt states that her stomach continues to feel upset. Rituximab stopped Faythe Casa NP aware.  After consulting with Dr. Tasia Catchings, Faythe Casa NP orders to give NS at 999 ml/hour for 15 minutes and 10 mg IV Decadron.   1327: Pt up to BR with staff assist with continued diarrhea. Pt continues to deny any other s/s or concerns. Pt remains stable.  Per Faythe Casa NP give Lomotil to help with upset stomach and diarrhea.  1404: B/P 103/71, Temp 98.2 (which is increased from 96.0 at the beginning of infusion) Per Dr. Tasia Catchings restart Rituxian at 4th rate.  1434: Temp 99.3, Pt continues to deny any s/s or concerns. Pt reports that her stomach is feeling better at this time, Dr. Tasia Catchings aware. Per Dr. Tasia Catchings proceed with Rituximab.   2119: Pt tolerated remainder of infusion well, no s/s of distress or reaction noted. Pt and VS stable at this time.

## 2020-01-30 NOTE — Progress Notes (Signed)
Pt here for follow up and new Rituximab tx. No new concerns voiced.

## 2020-01-30 NOTE — Progress Notes (Signed)
Hematology/Oncology note [facility] [phone] [phone]   Patient Care Team: Juluis Pitch, MD as PCP - General (Family Medicine)  REFERRING PROVIDER: Juluis Pitch, MD  CHIEF COMPLAINTS/REASON FOR VISIT:  Follow-up for lymphoma  HISTORY OF PRESENTING ILLNESS:  Laurie Rice is a  60 y.o.  female with PMH listed below follows up for lymphoma 06/10/2017 CBC showed elevated white count of 38.5, hemoglobin 10.2, MCV 74.7, absolute lymphocyte 30.4, basophil 0.13, Previous lab records reviewed. Leukocytosis onset of chronic, duration is since at least 2014 Anemia is also chronic since at least 2016 No aggravating or elevated factors. Patient is a poor historian.  She has a seizure disorder and meningioma.  She is not medical competent and has legal guardian.  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    Associated symptoms or signs:  Denies weight loss, fever, chills,night sweats.  Recent history of weight loss about 10 pounds over the past 1 year.  Smoking history: Never smoking History of recent oral steroid use or steroid injection: Denies History of recent infection: Denies Autoimmune disease history.  Denies  Reviewed patient's past medical history, was previously seen by Dr. Mike Gip with last visit more than 3 years ago.  Today she wants to reestablish care at [facility] site. 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  Phenotype did not suggest a specific histologic type and was not typical for CLL/SLL, mantle cell lymphoma, hairy cell leukemia or follicular cell lymphoma.  BCR ABL testing was negative.  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  Chronic microcytic anemia since 2008.  She had hemoglobin electrophoresis reviewed normal hemoglobin F and A2 Patient has a history of bilateral DVT  and pulmonary embolism in 2010.  Has been on Coumadin chronically She developed right lower extremity DVT on 07/09/2014 secondary to subtherapeutic INR   INTERVAL HISTORY Laurie Rice is a 60 y.o. female who has above history reviewed by me today presents for follow up visit for lymphoma. Problems and complaints are listed below: Patient was accompanied by caregiver today.   Patient presents for evaluation prior to rituximab treatments.  No new complaints. Caregiver reported 1 Covid positive case at the facility patient was tested negative using home kit.  She has no symptoms today. Review of Systems  Unable to perform ROS: Other (Intellectual disability)  Constitutional: Positive for fatigue and unexpected weight change.     MEDICAL HISTORY:  Past Medical History:  Diagnosis Date  . Anemia    chronic, mcv chronically low  . Cellulitis    History of  . 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 History of lymphocytosis   . Impetigo    History of  . Liver masses    History of, Benign  . Meningioma ([place])   . Mild mental retardation   . Monoclonal gammopathy    low level  . Personal history of pulmonary embolism   . Seizures ([place])   . Thyroid mass    history of    SURGICAL HISTORY: Past Surgical History:  Procedure Laterality Date  . NO PAST SURGERIES      SOCIAL HISTORY: Social History   Socioeconomic History  . Marital status: Single    Spouse name: Not on file  . Number of children: Not on file  . Years of education: Not on file  .  Highest education level: Not on file  Occupational History  . Not on file  Tobacco Use  . Smoking status: Never Smoker  . Smokeless tobacco: Never Used  Substance and Sexual Activity  . Alcohol use: No  . Drug use: No  . Sexual activity: Not on file  Other Topics Concern  . Not on file  Social History Narrative  . Not on file   Social Determinants of Health   Financial Resource Strain: Not on  file  Food Insecurity: Not on file  Transportation Needs: Not on file  Physical Activity: Not on file  Stress: Not on file  Social Connections: Not on file  Intimate Partner Violence: Not on file    FAMILY HISTORY: Family History  Problem Relation Age of Onset  . Hypertension Other   . Arthritis Other   . Breast cancer Neg Hx     ALLERGIES:  has No Known Allergies.  MEDICATIONS:  Current Outpatient Medications  Medication Sig Dispense Refill  . bisoprolol-hydrochlorothiazide (ZIAC) 2.5-6.25 MG tablet Take 2 tablets by mouth daily.    . Emollient (CETAPHIL) cream APPLY SMALL AMOUNT TO AFFECTED AREAS ONCE DAILY    . ferrous sulfate 325 (65 FE) MG EC tablet Take 1 tablet (325 mg total) by mouth daily with breakfast. With orange juice 30 tablet 3  . folic acid (FOLVITE) 1 MG tablet TAKE ONE TABLET BY MOUTH EACH DAY.    Marland Kitchen PHENobarbital (LUMINAL) 32.4 MG tablet Take 60 mg by mouth at bedtime.     . phenytoin (DILANTIN) 100 MG ER capsule TAKE 2 CAPSULES BY MOUTH AT BEDTIME (SEIZURE DISORDER)    . phenytoin (DILANTIN) 50 MG tablet Chew 50 mg by mouth at bedtime.    . triamcinolone (KENALOG) 0.1 % APPLY TO RIGHT LOWER LEG ONCE DAILY AS NEEDED FOR FLARES (AVOID FACE, GROIN, AXILLA) 80 g 1  . warfarin (COUMADIN) 1 MG tablet TAKE 2 TABLETS (2MG) BY MOUTH ONCE DAILY (ALONG W/ 5MG=7MG TOTAL DOSAGE).    Marland Kitchen warfarin (COUMADIN) 5 MG tablet TAKE 1 TABLET BY MOUTH EVERY DAY (ALONG W/ 2MG=7MG TOTAL DOSAGE).     No current facility-administered medications for this visit.     PHYSICAL EXAMINATION: ECOG PERFORMANCE STATUS: 1 - Symptomatic but completely ambulatory Vitals:   01/30/20 0834  BP: (!) 141/61  Pulse: 69  Temp: (!) 97.2 F (36.2 C)  SpO2: 100%   Filed Weights   01/30/20 0834  Weight: 187 lb 9 oz (85.1 kg)    Physical Exam Constitutional:      General: She is not in acute distress. HENT:     Head: Normocephalic and atraumatic.  Eyes:     General: No scleral  icterus. Cardiovascular:     Rate and Rhythm: Normal rate and regular rhythm.     Heart sounds: Normal heart sounds.  Pulmonary:     Effort: Pulmonary effort is normal. No respiratory distress.     Breath sounds: No wheezing.  Abdominal:     General: Bowel sounds are normal. There is no distension.     Palpations: Abdomen is soft.  Musculoskeletal:        General: No deformity. Normal range of motion.     Cervical back: Normal range of motion and neck supple.  Skin:    General: Skin is warm and dry.     Findings: No erythema or rash.  Neurological:     Mental Status: She is alert. Mental status is at baseline.  Psychiatric:  Mood and Affect: Mood normal.     CMP Latest Ref Rng & Units 01/14/2020  Glucose 70 - 99 mg/dL 113(H)  BUN 6 - 20 mg/dL 16  Creatinine 0.44 - 1.00 mg/dL 0.66  Sodium 135 - 145 mmol/L 136  Potassium 3.5 - 5.1 mmol/L 3.6  Chloride 98 - 111 mmol/L 104  CO2 22 - 32 mmol/L 25  Calcium 8.9 - 10.3 mg/dL 8.7(L)  Total Protein 6.5 - 8.1 g/dL 7.8  Total Bilirubin 0.3 - 1.2 mg/dL 0.2(L)  Alkaline Phos 38 - 126 U/L 97  AST 15 - 41 U/L 19  ALT 0 - 44 U/L 13   CBC Latest Ref Rng & Units 01/14/2020  WBC 4.0 - 10.5 K/uL 32.8(H)  Hemoglobin 12.0 - 15.0 g/dL 9.3(L)  Hematocrit 36.0 - 46.0 % 30.9(L)  Platelets 150 - 400 K/uL 181     RADIOGRAPHIC STUDIES: I have personally reviewed the radiological images as listed and agreed with the findings in the report. No results found.  LABORATORY DATA:  I have reviewed the data as listed Lab Results  Component Value Date   WBC 32.8 (H) 01/14/2020   HGB 9.3 (L) 01/14/2020   HCT 30.9 (L) 01/14/2020   MCV 71.7 (L) 01/14/2020   PLT 181 01/14/2020   Recent Labs    07/02/19 1145 01/14/20 0906  NA 139 136  K 3.9 3.6  CL 102 104  CO2 27 25  GLUCOSE 91 113*  BUN 14 16  CREATININE 0.69 0.66  CALCIUM 8.8* 8.7*  GFRNONAA >60 >60  GFRAA >60  --   PROT 8.6* 7.8  ALBUMIN 3.5 3.3*  AST 16 19  ALT 15 13   ALKPHOS 100 97  BILITOT 0.4 0.2*   Iron/TIBC/Ferritin/ %Sat    Component Value Date/Time   IRON 66 11/09/2014 1455   IRON 94 08/22/2012 1142   TIBC 225 (L) 11/09/2014 1455   TIBC 221 (L) 08/22/2012 1142   FERRITIN 62 09/07/2015 0957   FERRITIN 65 08/22/2012 1142   IRONPCTSAT 29 11/09/2014 1455   IRONPCTSAT 43 08/22/2012 1142        ASSESSMENT & PLAN:  1. Low grade malignant lymphoma ([place])   2. Serum viscosity increased   3. Encounter for antineoplastic chemotherapy   4. Low grade B-cell lymphoma ([facility])    #Low-grade non-Hodgkin B-cell lymphoma IgM gammopathy,IgM level 3366, M protein is elevated at 2.1. Bone marrow biopsy reveals marked involvement of lymphoma. MYD 88 negative,  Cytogenetics showed translocation (4;6) Increased viscosity Progressively worsening anemia.  Hemoglobin 9.1. Bone marrow is marked involvement of lymphoma, anemia with hemoglobin less than 10, gradually worsening.  Viscosity is now elevated at 2.3. IgM is 3135.  I have called patient's legal guardian Mr.Graves and discussed with him. I recommend weekly Rituximab x 4 treatment. Discussed in details about the rationale and possible side effects. Mr.Graves voices understanding and gives permission to treat patient. Chemo Education -Rituxmiab with legal guardian.  Legal guardian and caregiver have been provided chemotherapy education during interval Labs reviewed.  Proceed with rituximab treatment today. Patient will come in 1 week and proceed with cycle 2 rituximab. Plan total 4 treatments.   Chronic microcytic anemia, previous hemoglobinopathy showed normal hemoglobin.  Her previous work-up showed normal ferritin and iron saturation.  She probably has alpha thalassemia.  Check hemoglobinopathy evaluation..  Orders Placed This Encounter  Procedures  . CBC with Differential/Platelet    Standing Status:   Future    Standing Expiration Date:   01/29/2021  .  Comprehensive metabolic panel    Standing  Status:   Future    Standing Expiration Date:   01/29/2021  . PHYSICIAN COMMUNICATION ORDER    Hepatitis B Virus screening with HBsAg and anti-HBc recommended prior to treatment with rituximab, ofatumumab, or obinutuzumab.    All questions were answered. The patient knows to call the clinic with any problems questions or concerns.  Return of visit: 2 weeks  Earlie Server, MD, PhD Hematology Oncology [facility] Pager- [phone] 01/30/2020

## 2020-02-02 ENCOUNTER — Telehealth: Payer: Self-pay

## 2020-02-02 NOTE — Telephone Encounter (Signed)
Telephone call to patient for follow up after receiving first infusion.   Patient caregiver, Elie Confer states infusion went great.  States eating good and drinking plenty of fluids.   Denies any nausea or vomiting.  Encouraged patient caregiver to call for any concerns or questions.

## 2020-02-04 ENCOUNTER — Other Ambulatory Visit: Payer: Self-pay | Admitting: Oncology

## 2020-02-04 LAB — HGB FRACTIONATION BY HPLC
Hgb A2: 2.6 % (ref 1.8–3.2)
Hgb A: 97.4 % (ref 96.4–98.8)
Hgb C: 0 %
Hgb E: 0 %
Hgb F: 0 % (ref 0.0–2.0)
Hgb S: 0 %
Hgb Variant: 0 %

## 2020-02-04 LAB — HGB FRACTIONATION CASCADE

## 2020-02-06 ENCOUNTER — Other Ambulatory Visit: Payer: Self-pay | Admitting: Oncology

## 2020-02-06 ENCOUNTER — Inpatient Hospital Stay: Payer: Medicare Other | Attending: Oncology

## 2020-02-06 VITALS — BP 129/63 | HR 65 | Temp 97.9°F | Resp 16

## 2020-02-06 DIAGNOSIS — C8519 Unspecified B-cell lymphoma, extranodal and solid organ sites: Secondary | ICD-10-CM | POA: Diagnosis present

## 2020-02-06 DIAGNOSIS — Z5112 Encounter for antineoplastic immunotherapy: Secondary | ICD-10-CM | POA: Insufficient documentation

## 2020-02-06 DIAGNOSIS — D472 Monoclonal gammopathy: Secondary | ICD-10-CM | POA: Diagnosis not present

## 2020-02-06 DIAGNOSIS — D509 Iron deficiency anemia, unspecified: Secondary | ICD-10-CM | POA: Diagnosis not present

## 2020-02-06 DIAGNOSIS — C8510 Unspecified B-cell lymphoma, unspecified site: Secondary | ICD-10-CM

## 2020-02-06 MED ORDER — SODIUM CHLORIDE 0.9 % IV SOLN
Freq: Once | INTRAVENOUS | Status: AC
  Filled 2020-02-06: qty 250

## 2020-02-06 MED ORDER — SODIUM CHLORIDE 0.9 % IV SOLN
375.0000 mg/m2 | Freq: Once | INTRAVENOUS | Status: AC
  Administered 2020-02-06: 700 mg via INTRAVENOUS
  Filled 2020-02-06: qty 50

## 2020-02-06 MED ORDER — ACETAMINOPHEN 325 MG PO TABS
650.0000 mg | ORAL_TABLET | Freq: Once | ORAL | Status: AC
Start: 2020-02-06 — End: 2020-02-06
  Administered 2020-02-06: 650 mg via ORAL
  Filled 2020-02-06: qty 2

## 2020-02-06 MED ORDER — SODIUM CHLORIDE 0.9 % IV SOLN
10.0000 mg | Freq: Once | INTRAVENOUS | Status: AC
  Administered 2020-02-06: 10 mg via INTRAVENOUS
  Filled 2020-02-06: qty 10

## 2020-02-06 MED ORDER — DIPHENHYDRAMINE HCL 25 MG PO CAPS
50.0000 mg | ORAL_CAPSULE | Freq: Once | ORAL | Status: AC
  Administered 2020-02-06: 50 mg via ORAL
  Filled 2020-02-06: qty 2

## 2020-02-06 NOTE — Progress Notes (Signed)
Per Dr. Tasia Catchings, add Decadron 10 MG IV to premeds due to previous infusion reaction and to start patient as a first time Rituxan treatment.   Patient has tolerated treatment well thus far. Per Dr. Tasia Catchings, okay to skip every other rate change until 400mg /hr.   Patient tolerated infusion well with no complications. Patient and VSS at discharge. Patient discharged home with caregiver, Elie Confer.

## 2020-02-13 ENCOUNTER — Inpatient Hospital Stay: Payer: Medicare Other

## 2020-02-13 ENCOUNTER — Inpatient Hospital Stay (HOSPITAL_BASED_OUTPATIENT_CLINIC_OR_DEPARTMENT_OTHER): Payer: Medicare Other | Admitting: Oncology

## 2020-02-13 ENCOUNTER — Encounter: Payer: Self-pay | Admitting: Oncology

## 2020-02-13 VITALS — BP 129/76 | HR 56 | Resp 18

## 2020-02-13 VITALS — BP 133/79 | HR 64 | Temp 98.5°F | Wt 192.5 lb

## 2020-02-13 DIAGNOSIS — C8590 Non-Hodgkin lymphoma, unspecified, unspecified site: Secondary | ICD-10-CM

## 2020-02-13 DIAGNOSIS — Z5112 Encounter for antineoplastic immunotherapy: Secondary | ICD-10-CM | POA: Diagnosis not present

## 2020-02-13 DIAGNOSIS — Z5111 Encounter for antineoplastic chemotherapy: Secondary | ICD-10-CM

## 2020-02-13 DIAGNOSIS — C8510 Unspecified B-cell lymphoma, unspecified site: Secondary | ICD-10-CM | POA: Diagnosis not present

## 2020-02-13 DIAGNOSIS — D509 Iron deficiency anemia, unspecified: Secondary | ICD-10-CM

## 2020-02-13 LAB — CBC WITH DIFFERENTIAL/PLATELET
Abs Immature Granulocytes: 0.01 10*3/uL (ref 0.00–0.07)
Basophils Absolute: 0.1 10*3/uL (ref 0.0–0.1)
Basophils Relative: 1 %
Eosinophils Absolute: 0.1 10*3/uL (ref 0.0–0.5)
Eosinophils Relative: 1 %
HCT: 33.8 % — ABNORMAL LOW (ref 36.0–46.0)
Hemoglobin: 10.2 g/dL — ABNORMAL LOW (ref 12.0–15.0)
Immature Granulocytes: 0 %
Lymphocytes Relative: 39 %
Lymphs Abs: 2.7 10*3/uL (ref 0.7–4.0)
MCH: 21.7 pg — ABNORMAL LOW (ref 26.0–34.0)
MCHC: 30.2 g/dL (ref 30.0–36.0)
MCV: 71.9 fL — ABNORMAL LOW (ref 80.0–100.0)
Monocytes Absolute: 0.7 10*3/uL (ref 0.1–1.0)
Monocytes Relative: 10 %
Neutro Abs: 3.4 10*3/uL (ref 1.7–7.7)
Neutrophils Relative %: 49 %
Platelets: 286 10*3/uL (ref 150–400)
RBC: 4.7 MIL/uL (ref 3.87–5.11)
RDW: 18.6 % — ABNORMAL HIGH (ref 11.5–15.5)
WBC: 6.9 10*3/uL (ref 4.0–10.5)
nRBC: 0 % (ref 0.0–0.2)

## 2020-02-13 LAB — COMPREHENSIVE METABOLIC PANEL
ALT: 14 U/L (ref 0–44)
AST: 21 U/L (ref 15–41)
Albumin: 3.1 g/dL — ABNORMAL LOW (ref 3.5–5.0)
Alkaline Phosphatase: 80 U/L (ref 38–126)
Anion gap: 10 (ref 5–15)
BUN: 15 mg/dL (ref 6–20)
CO2: 24 mmol/L (ref 22–32)
Calcium: 8.6 mg/dL — ABNORMAL LOW (ref 8.9–10.3)
Chloride: 106 mmol/L (ref 98–111)
Creatinine, Ser: 0.74 mg/dL (ref 0.44–1.00)
GFR, Estimated: >60 mL/min (ref >60)
Glucose, Bld: 70 mg/dL (ref 70–99)
Potassium: 4.2 mmol/L (ref 3.5–5.1)
Sodium: 140 mmol/L (ref 135–145)
Total Bilirubin: 0.5 mg/dL (ref 0.3–1.2)
Total Protein: 7.7 g/dL (ref 6.5–8.1)

## 2020-02-13 LAB — IRON AND TIBC
Iron: 99 ug/dL (ref 28–170)
Saturation Ratios: 51 % — ABNORMAL HIGH (ref 10.4–31.8)
TIBC: 196 ug/dL — ABNORMAL LOW (ref 250–450)
UIBC: 97 ug/dL

## 2020-02-13 LAB — FERRITIN: Ferritin: 112 ng/mL (ref 11–307)

## 2020-02-13 MED ORDER — SODIUM CHLORIDE 0.9 % IV SOLN
375.0000 mg/m2 | Freq: Once | INTRAVENOUS | Status: AC
  Administered 2020-02-13: 700 mg via INTRAVENOUS
  Filled 2020-02-13: qty 50

## 2020-02-13 MED ORDER — ACETAMINOPHEN 325 MG PO TABS
650.0000 mg | ORAL_TABLET | Freq: Once | ORAL | Status: AC
  Administered 2020-02-13: 650 mg via ORAL
  Filled 2020-02-13: qty 2

## 2020-02-13 MED ORDER — SODIUM CHLORIDE 0.9 % IV SOLN
10.0000 mg | Freq: Once | INTRAVENOUS | Status: AC
  Administered 2020-02-13: 10 mg via INTRAVENOUS
  Filled 2020-02-13: qty 10

## 2020-02-13 MED ORDER — SODIUM CHLORIDE 0.9 % IV SOLN
Freq: Once | INTRAVENOUS | Status: AC
  Filled 2020-02-13: qty 250

## 2020-02-13 MED ORDER — DIPHENHYDRAMINE HCL 25 MG PO CAPS
50.0000 mg | ORAL_CAPSULE | Freq: Once | ORAL | Status: AC
  Administered 2020-02-13: 50 mg via ORAL
  Filled 2020-02-13: qty 2

## 2020-02-13 NOTE — Progress Notes (Unsigned)
Pt had rapid rituxan today without any issues, VSS, pt did have decadron 10mg  IVPB premed.

## 2020-02-13 NOTE — Progress Notes (Signed)
Hematology/Oncology note [facility] Telephone:([phone] Fax:([phone]   Patient Care Team: Juluis Pitch, MD as PCP - General (Family Medicine)  REFERRING PROVIDER: Juluis Pitch, MD  CHIEF COMPLAINTS/REASON FOR VISIT:  Follow-up for lymphoma  HISTORY OF PRESENTING ILLNESS:  Laurie Rice is a  60 y.o.  female with PMH listed below follows up for lymphoma 06/10/2017 CBC showed elevated white count of 38.5, hemoglobin 10.2, MCV 74.7, absolute lymphocyte 30.4, basophil 0.13, Previous lab records reviewed. Leukocytosis onset of chronic, duration is since at least 2014 Anemia is also chronic since at least 2016 No aggravating or elevated factors. Patient is a poor historian.  She has a seizure disorder and meningioma.  She is not medical competent and has legal guardian.  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    Associated symptoms or signs:  Denies weight loss, fever, chills,night sweats.  Recent history of weight loss about 10 pounds over the past 1 year.  Smoking history: Never smoking History of recent oral steroid use or steroid injection: Denies History of recent infection: Denies Autoimmune disease history.  Denies  Reviewed patient's past medical history, was previously seen by Dr. Mike Gip with last visit more than 3 years ago.  Today she wants to reestablish care at [facility] site. 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  Phenotype did not suggest a specific histologic type and was not typical for CLL/SLL, mantle cell lymphoma, hairy cell leukemia or follicular cell lymphoma.  BCR ABL testing was negative.  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  Chronic microcytic anemia since 2008.  She had hemoglobin electrophoresis reviewed normal hemoglobin F and A2 Patient has a history of bilateral DVT  and pulmonary embolism in 2010.  Has been on Coumadin chronically She developed right lower extremity DVT on 07/09/2014 secondary to subtherapeutic INR   INTERVAL HISTORY Laurie Rice is a 60 y.o. female who has above history reviewed by me today presents for follow up visit for lymphoma. Problems and complaints are listed below: Patient was accompanied by caregiver today.   Patient tolerated first 2 rituximab treatments.  01/29/2017 she developed possible myeloid rituximab infusion reaction and was seen by nurse practitioner.  Infusion was temporarily held, patient was given dexamethasone and symptoms improved and the patient was able to finish rituximab treatment.   .Review of Systems  Unable to perform ROS: Other (Intellectual disability)  Constitutional: Positive for fatigue and unexpected weight change.     MEDICAL HISTORY:  Past Medical History:  Diagnosis Date  . Anemia    chronic, mcv chronically low  . Cellulitis    History of  . 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 History of lymphocytosis   . Impetigo    History of  . Liver masses    History of, Benign  . Meningioma ([place])   . Mild mental retardation   . Monoclonal gammopathy    low level  . Personal history of pulmonary embolism   . Seizures ([place])   . Thyroid mass    history of    SURGICAL HISTORY: Past Surgical History:  Procedure Laterality Date  . NO PAST SURGERIES      SOCIAL HISTORY: Social History   Socioeconomic History  . Marital status: Single    Spouse name: Not on file  . Number of children: Not on  file  . Years of education: Not on file  . Highest education level: Not on file  Occupational History  . Not on file  Tobacco Use  . Smoking status: Never Smoker  . Smokeless tobacco: Never Used  Substance and Sexual Activity  . Alcohol use: No  . Drug use: No  . Sexual activity: Not on file  Other Topics Concern  . Not on file  Social History Narrative   . Not on file   Social Determinants of Health   Financial Resource Strain: Not on file  Food Insecurity: Not on file  Transportation Needs: Not on file  Physical Activity: Not on file  Stress: Not on file  Social Connections: Not on file  Intimate Partner Violence: Not on file    FAMILY HISTORY: Family History  Problem Relation Age of Onset  . Hypertension Other   . Arthritis Other   . Breast cancer Neg Hx     ALLERGIES:  has No Known Allergies.  MEDICATIONS:  Current Outpatient Medications  Medication Sig Dispense Refill  . bisoprolol-hydrochlorothiazide (ZIAC) 2.5-6.25 MG tablet Take 2 tablets by mouth daily.    . Emollient (CETAPHIL) cream APPLY SMALL AMOUNT TO AFFECTED AREAS ONCE DAILY    . ferrous sulfate 325 (65 FE) MG EC tablet Take 1 tablet (325 mg total) by mouth daily with breakfast. With orange juice 30 tablet 3  . folic acid (FOLVITE) 1 MG tablet TAKE ONE TABLET BY MOUTH EACH DAY.    Marland Kitchen PHENobarbital (LUMINAL) 32.4 MG tablet Take 60 mg by mouth at bedtime.     . phenytoin (DILANTIN) 100 MG ER capsule TAKE 2 CAPSULES BY MOUTH AT BEDTIME (SEIZURE DISORDER)    . phenytoin (DILANTIN) 50 MG tablet Chew 50 mg by mouth at bedtime.    . triamcinolone (KENALOG) 0.1 % APPLY TO RIGHT LOWER LEG ONCE DAILY AS NEEDED FOR FLARES (AVOID FACE, GROIN, AXILLA) 80 g 1  . warfarin (COUMADIN) 1 MG tablet TAKE 2 TABLETS (2MG) BY MOUTH ONCE DAILY (ALONG W/ 5MG=7MG TOTAL DOSAGE).    Marland Kitchen warfarin (COUMADIN) 5 MG tablet TAKE 1 TABLET BY MOUTH EVERY DAY (ALONG W/ 2MG=7MG TOTAL DOSAGE).     No current facility-administered medications for this visit.     PHYSICAL EXAMINATION: ECOG PERFORMANCE STATUS: 1 - Symptomatic but completely ambulatory Vitals:   02/13/20 0833  BP: 133/79  Pulse: 64  Temp: 98.5 F (36.9 C)  SpO2: 100%   Filed Weights   02/13/20 0833  Weight: 192 lb 8 oz (87.3 kg)    Physical Exam Constitutional:      General: She is not in acute distress. HENT:      Head: Normocephalic and atraumatic.  Eyes:     General: No scleral icterus. Cardiovascular:     Rate and Rhythm: Normal rate and regular rhythm.     Heart sounds: Normal heart sounds.  Pulmonary:     Effort: Pulmonary effort is normal. No respiratory distress.     Breath sounds: No wheezing.  Abdominal:     General: Bowel sounds are normal. There is no distension.     Palpations: Abdomen is soft.  Musculoskeletal:        General: No deformity. Normal range of motion.     Cervical back: Normal range of motion and neck supple.  Skin:    General: Skin is warm and dry.     Findings: No erythema or rash.  Neurological:     Mental Status: She is alert. Mental  status is at baseline.  Psychiatric:        Mood and Affect: Mood normal.     CMP Latest Ref Rng & Units 01/30/2020  Glucose 70 - 99 mg/dL 95  BUN 6 - 20 mg/dL 14  Creatinine 0.44 - 1.00 mg/dL 0.54  Sodium 135 - 145 mmol/L 139  Potassium 3.5 - 5.1 mmol/L 3.7  Chloride 98 - 111 mmol/L 106  CO2 22 - 32 mmol/L 24  Calcium 8.9 - 10.3 mg/dL 8.3(L)  Total Protein 6.5 - 8.1 g/dL 7.3  Total Bilirubin 0.3 - 1.2 mg/dL 0.3  Alkaline Phos 38 - 126 U/L 78  AST 15 - 41 U/L 16  ALT 0 - 44 U/L 13   CBC Latest Ref Rng & Units 01/30/2020  WBC 4.0 - 10.5 K/uL 31.1(H)  Hemoglobin 12.0 - 15.0 g/dL 9.1(L)  Hematocrit 36.0 - 46.0 % 30.2(L)  Platelets 150 - 400 K/uL 237     RADIOGRAPHIC STUDIES: I have personally reviewed the radiological images as listed and agreed with the findings in the report. No results found.  LABORATORY DATA:  I have reviewed the data as listed Lab Results  Component Value Date   WBC 31.1 (H) 01/30/2020   HGB 9.1 (L) 01/30/2020   HCT 30.2 (L) 01/30/2020   MCV 71.4 (L) 01/30/2020   PLT 237 01/30/2020   Recent Labs    07/02/19 1145 01/14/20 0906 01/30/20 0821  NA 139 136 139  K 3.9 3.6 3.7  CL 102 104 106  CO2 '27 25 24  ' GLUCOSE 91 113* 95  BUN '14 16 14  ' CREATININE 0.69 0.66 0.54  CALCIUM 8.8* 8.7*  8.3*  GFRNONAA >60 >60 >60  GFRAA >60  --   --   PROT 8.6* 7.8 7.3  ALBUMIN 3.5 3.3* 2.9*  AST '16 19 16  ' ALT '15 13 13  ' ALKPHOS 100 97 78  BILITOT 0.4 0.2* 0.3   Iron/TIBC/Ferritin/ %Sat    Component Value Date/Time   IRON 66 11/09/2014 1455   IRON 94 08/22/2012 1142   TIBC 225 (L) 11/09/2014 1455   TIBC 221 (L) 08/22/2012 1142   FERRITIN 62 09/07/2015 0957   FERRITIN 65 08/22/2012 1142   IRONPCTSAT 29 11/09/2014 1455   IRONPCTSAT 43 08/22/2012 1142        ASSESSMENT & PLAN:  1. Encounter for antineoplastic chemotherapy   2. Low grade B-cell lymphoma ([place])   3. Microcytic anemia    #Low-grade non-Hodgkin B-cell lymphoma IgM gammopathy,IgM level 3366, M protein is elevated at 2.1. Bone marrow biopsy reveals marked involvement of lymphoma. MYD 88 negative,  Cytogenetics showed translocation (4;6) Increased viscosity  at 2.3 S/p 2 rituximab treatments.  Hemoglobin has improved to 10 today.  Labs are reviewed and discussed with patient. Proceed with 3rd Rituximab treatment today and 4th treatment in 1 week.  Patient will come back in 6 weeks with repeat multiple myeloma, CBC CMP.   Chronic microcytic anemia, previous hemoglobinopathy showed normal hemoglobin.   I will check iron TIBC ferritin.  Orders Placed This Encounter  Procedures  . Ferritin    Standing Status:   Future    Standing Expiration Date:   08/12/2020  . Iron and TIBC    Standing Status:   Future    Standing Expiration Date:   02/12/2021    All questions were answered. The patient knows to call the clinic with any problems questions or concerns.  Return of visit: 6 weeks  Earlie Server, MD, PhD  Hematology [place] at [facility] Pager- [phone] 02/13/2020

## 2020-02-13 NOTE — Progress Notes (Signed)
Patient here for follow up. No new concerns voiced.  °

## 2020-02-20 ENCOUNTER — Inpatient Hospital Stay: Payer: Medicare Other

## 2020-02-20 ENCOUNTER — Ambulatory Visit: Payer: Medicare Other | Admitting: Oncology

## 2020-02-20 ENCOUNTER — Other Ambulatory Visit: Payer: Self-pay

## 2020-02-20 ENCOUNTER — Other Ambulatory Visit: Payer: Medicare Other

## 2020-02-20 ENCOUNTER — Other Ambulatory Visit: Payer: Self-pay | Admitting: Oncology

## 2020-02-20 VITALS — BP 157/69 | HR 60 | Temp 97.1°F | Resp 20 | Wt 190.6 lb

## 2020-02-20 DIAGNOSIS — C8510 Unspecified B-cell lymphoma, unspecified site: Secondary | ICD-10-CM

## 2020-02-20 DIAGNOSIS — Z5112 Encounter for antineoplastic immunotherapy: Secondary | ICD-10-CM | POA: Diagnosis not present

## 2020-02-20 MED ORDER — DIPHENHYDRAMINE HCL 25 MG PO CAPS
50.0000 mg | ORAL_CAPSULE | Freq: Once | ORAL | Status: AC
  Administered 2020-02-20: 50 mg via ORAL
  Filled 2020-02-20: qty 2

## 2020-02-20 MED ORDER — SODIUM CHLORIDE 0.9 % IV SOLN
375.0000 mg/m2 | Freq: Once | INTRAVENOUS | Status: AC
  Administered 2020-02-20: 700 mg via INTRAVENOUS
  Filled 2020-02-20: qty 50

## 2020-02-20 MED ORDER — SODIUM CHLORIDE 0.9 % IV SOLN
10.0000 mg | Freq: Once | INTRAVENOUS | Status: AC
  Administered 2020-02-20: 10 mg via INTRAVENOUS
  Filled 2020-02-20: qty 10

## 2020-02-20 MED ORDER — ACETAMINOPHEN 325 MG PO TABS
650.0000 mg | ORAL_TABLET | Freq: Once | ORAL | Status: AC
Start: 2020-02-20 — End: 2020-02-20
  Administered 2020-02-20: 650 mg via ORAL
  Filled 2020-02-20: qty 2

## 2020-02-20 MED ORDER — SODIUM CHLORIDE 0.9 % IV SOLN
Freq: Once | INTRAVENOUS | Status: AC
  Filled 2020-02-20: qty 250

## 2020-03-26 ENCOUNTER — Encounter: Payer: Self-pay | Admitting: Oncology

## 2020-03-26 ENCOUNTER — Inpatient Hospital Stay (HOSPITAL_BASED_OUTPATIENT_CLINIC_OR_DEPARTMENT_OTHER): Payer: Medicare Other | Admitting: Oncology

## 2020-03-26 ENCOUNTER — Other Ambulatory Visit: Payer: Self-pay

## 2020-03-26 ENCOUNTER — Inpatient Hospital Stay: Payer: Medicare Other | Attending: Oncology

## 2020-03-26 ENCOUNTER — Other Ambulatory Visit: Payer: Self-pay | Admitting: Oncology

## 2020-03-26 VITALS — BP 138/64 | HR 86 | Temp 97.8°F | Resp 16 | Wt 184.5 lb

## 2020-03-26 DIAGNOSIS — Z79899 Other long term (current) drug therapy: Secondary | ICD-10-CM | POA: Diagnosis not present

## 2020-03-26 DIAGNOSIS — Z7901 Long term (current) use of anticoagulants: Secondary | ICD-10-CM | POA: Insufficient documentation

## 2020-03-26 DIAGNOSIS — Z8261 Family history of arthritis: Secondary | ICD-10-CM | POA: Insufficient documentation

## 2020-03-26 DIAGNOSIS — D649 Anemia, unspecified: Secondary | ICD-10-CM | POA: Insufficient documentation

## 2020-03-26 DIAGNOSIS — G40909 Epilepsy, unspecified, not intractable, without status epilepticus: Secondary | ICD-10-CM | POA: Insufficient documentation

## 2020-03-26 DIAGNOSIS — E079 Disorder of thyroid, unspecified: Secondary | ICD-10-CM | POA: Diagnosis not present

## 2020-03-26 DIAGNOSIS — Z8249 Family history of ischemic heart disease and other diseases of the circulatory system: Secondary | ICD-10-CM | POA: Diagnosis not present

## 2020-03-26 DIAGNOSIS — C8510 Unspecified B-cell lymphoma, unspecified site: Secondary | ICD-10-CM

## 2020-03-26 DIAGNOSIS — D759 Disease of blood and blood-forming organs, unspecified: Secondary | ICD-10-CM

## 2020-03-26 DIAGNOSIS — Z86711 Personal history of pulmonary embolism: Secondary | ICD-10-CM | POA: Insufficient documentation

## 2020-03-26 DIAGNOSIS — Z86718 Personal history of other venous thrombosis and embolism: Secondary | ICD-10-CM | POA: Diagnosis not present

## 2020-03-26 LAB — CBC WITH DIFFERENTIAL/PLATELET
Abs Immature Granulocytes: 0.02 10*3/uL (ref 0.00–0.07)
Basophils Absolute: 0.1 10*3/uL (ref 0.0–0.1)
Basophils Relative: 1 %
Eosinophils Absolute: 0.1 10*3/uL (ref 0.0–0.5)
Eosinophils Relative: 1 %
HCT: 32.9 % — ABNORMAL LOW (ref 36.0–46.0)
Hemoglobin: 9.8 g/dL — ABNORMAL LOW (ref 12.0–15.0)
Immature Granulocytes: 0 %
Lymphocytes Relative: 31 %
Lymphs Abs: 2.4 10*3/uL (ref 0.7–4.0)
MCH: 21.4 pg — ABNORMAL LOW (ref 26.0–34.0)
MCHC: 29.8 g/dL — ABNORMAL LOW (ref 30.0–36.0)
MCV: 71.8 fL — ABNORMAL LOW (ref 80.0–100.0)
Monocytes Absolute: 0.6 10*3/uL (ref 0.1–1.0)
Monocytes Relative: 8 %
Neutro Abs: 4.5 10*3/uL (ref 1.7–7.7)
Neutrophils Relative %: 59 %
Platelets: 176 10*3/uL (ref 150–400)
RBC: 4.58 MIL/uL (ref 3.87–5.11)
RDW: 17.1 % — ABNORMAL HIGH (ref 11.5–15.5)
WBC: 7.6 10*3/uL (ref 4.0–10.5)
nRBC: 0 % (ref 0.0–0.2)

## 2020-03-26 LAB — COMPREHENSIVE METABOLIC PANEL
ALT: 13 U/L (ref 0–44)
AST: 19 U/L (ref 15–41)
Albumin: 3.1 g/dL — ABNORMAL LOW (ref 3.5–5.0)
Alkaline Phosphatase: 73 U/L (ref 38–126)
Anion gap: 10 (ref 5–15)
BUN: 16 mg/dL (ref 6–20)
CO2: 25 mmol/L (ref 22–32)
Calcium: 8.8 mg/dL — ABNORMAL LOW (ref 8.9–10.3)
Chloride: 103 mmol/L (ref 98–111)
Creatinine, Ser: 0.76 mg/dL (ref 0.44–1.00)
GFR, Estimated: >60 mL/min (ref >60)
Glucose, Bld: 84 mg/dL (ref 70–99)
Potassium: 3.6 mmol/L (ref 3.5–5.1)
Sodium: 138 mmol/L (ref 135–145)
Total Bilirubin: 0.3 mg/dL (ref 0.3–1.2)
Total Protein: 8.6 g/dL — ABNORMAL HIGH (ref 6.5–8.1)

## 2020-03-26 NOTE — Progress Notes (Signed)
Patient here for follow up. No new concerns voiced.  °

## 2020-03-26 NOTE — Progress Notes (Signed)
Hematology/Oncology note [facility] [phone] [phone]   Patient Care Team: Juluis Pitch, MD as PCP - General (Family Medicine)  REFERRING PROVIDER: Juluis Pitch, MD  CHIEF COMPLAINTS/REASON FOR VISIT:  Follow-up for lymphoma  HISTORY OF PRESENTING ILLNESS:  Laurie Rice is a  60 y.o.  female with PMH listed below follows up for lymphoma 06/10/2017 CBC showed elevated white count of 38.5, hemoglobin 10.2, MCV 74.7, absolute lymphocyte 30.4, basophil 0.13, Previous lab records reviewed. Leukocytosis onset of chronic, duration is since at least 2014 Anemia is also chronic since at least 2016 No aggravating or elevated factors. Patient is a poor historian.  She has a seizure disorder and meningioma.  She is not medical competent and has legal guardian.  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    Associated symptoms or signs:  Denies weight loss, fever, chills,night sweats.  Recent history of weight loss about 10 pounds over the past 1 year.  Smoking history: Never smoking History of recent oral steroid use or steroid injection: Denies History of recent infection: Denies Autoimmune disease history.  Denies  Reviewed patient's past medical history, was previously seen by Dr. Mike Gip with last visit more than 3 years ago.  Today she wants to reestablish care at [facility] site. 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  Phenotype did not suggest a specific histologic type and was not typical for CLL/SLL, mantle cell lymphoma, hairy cell leukemia or follicular cell lymphoma.  BCR ABL testing was negative.  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  Chronic microcytic anemia since 2008.  She had hemoglobin electrophoresis reviewed normal hemoglobin F and A2 Patient has a history of bilateral DVT  and pulmonary embolism in 2010.  Has been on Coumadin chronically She developed right lower extremity DVT on 07/09/2014 secondary to subtherapeutic INR   INTERVAL HISTORY Laurie Rice is a 60 y.o. female who has above history reviewed by me today presents for follow up visit for lymphoma. Problems and complaints are listed below: Patient was accompanied by caregiver today.   Status post weekly rituximab treatment x 4 She was accompanied by caregiver.  No reported new complaints.  Per caregiver, patient is appetite is good.  .Review of Systems  Unable to perform ROS: Other (Intellectual disability)  Constitutional: Positive for unexpected weight change. Negative for fatigue.     MEDICAL HISTORY:  Past Medical History:  Diagnosis Date  . Anemia    chronic, mcv chronically low  . Cellulitis    History of  . 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 History of lymphocytosis   . Impetigo    History of  . Liver masses    History of, Benign  . Meningioma ([place])   . Mild mental retardation   . Monoclonal gammopathy    low level  . Personal history of pulmonary embolism   . Seizures ([place])   . Thyroid mass    history of    SURGICAL HISTORY: Past Surgical History:  Procedure Laterality Date  . NO PAST SURGERIES      SOCIAL HISTORY: Social History   Socioeconomic History  . Marital status: Single    Spouse name: Not on file  . Number of children: Not on file  . Years of education: Not on file  . Highest education level: Not on file  Occupational History  . Not on file  Tobacco Use  . Smoking status: Never Smoker  . Smokeless tobacco: Never Used  Substance and Sexual Activity  . Alcohol use: No  . Drug use: No  . Sexual activity: Not on file  Other Topics Concern  . Not on file  Social History Narrative  . Not on file   Social Determinants of Health   Financial Resource Strain: Not on file  Food Insecurity: Not on file   Transportation Needs: Not on file  Physical Activity: Not on file  Stress: Not on file  Social Connections: Not on file  Intimate Partner Violence: Not on file    FAMILY HISTORY: Family History  Problem Relation Age of Onset  . Hypertension Other   . Arthritis Other   . Breast cancer Neg Hx     ALLERGIES:  has No Known Allergies.  MEDICATIONS:  Current Outpatient Medications  Medication Sig Dispense Refill  . bisoprolol-hydrochlorothiazide (ZIAC) 2.5-6.25 MG tablet Take 2 tablets by mouth daily.    . Emollient (CETAPHIL) cream APPLY SMALL AMOUNT TO AFFECTED AREAS ONCE DAILY    . ferrous sulfate 325 (65 FE) MG EC tablet Take 1 tablet (325 mg total) by mouth daily with breakfast. With orange juice 30 tablet 3  . folic acid (FOLVITE) 1 MG tablet TAKE ONE TABLET BY MOUTH EACH DAY.    Marland Kitchen PHENobarbital (LUMINAL) 32.4 MG tablet Take 60 mg by mouth at bedtime.     . phenytoin (DILANTIN) 100 MG ER capsule TAKE 2 CAPSULES BY MOUTH AT BEDTIME (SEIZURE DISORDER)    . phenytoin (DILANTIN) 50 MG tablet Chew 50 mg by mouth at bedtime.    . triamcinolone (KENALOG) 0.1 % APPLY TO RIGHT LOWER LEG ONCE DAILY AS NEEDED FOR FLARES (AVOID FACE, GROIN, AXILLA) 80 g 1  . warfarin (COUMADIN) 1 MG tablet TAKE 2 TABLETS (2MG) BY MOUTH ONCE DAILY (ALONG W/ 5MG=7MG TOTAL DOSAGE).    Marland Kitchen warfarin (COUMADIN) 5 MG tablet TAKE 1 TABLET BY MOUTH EVERY DAY (ALONG W/ 2MG=7MG TOTAL DOSAGE).     No current facility-administered medications for this visit.     PHYSICAL EXAMINATION: ECOG PERFORMANCE STATUS: 1 - Symptomatic but completely ambulatory Vitals:   03/26/20 1037  BP: 138/64  Pulse: 86  Resp: 16  Temp: 97.8 F (36.6 C)  SpO2: 100%   Filed Weights   03/26/20 1037  Weight: 184 lb 8 oz (83.7 kg)    Physical Exam Constitutional:      General: She is not in acute distress. HENT:     Head: Normocephalic and atraumatic.  Eyes:     General: No scleral icterus. Cardiovascular:     Rate and Rhythm:  Normal rate and regular rhythm.     Heart sounds: Normal heart sounds.  Pulmonary:     Effort: Pulmonary effort is normal. No respiratory distress.     Breath sounds: No wheezing.  Abdominal:     General: Bowel sounds are normal. There is no distension.     Palpations: Abdomen is soft.  Musculoskeletal:        General: No deformity. Normal range of motion.     Cervical back: Normal range of motion and neck supple.  Skin:    General: Skin is warm and dry.     Findings: No erythema or rash.  Neurological:     Mental Status: She is alert. Mental status is at baseline.  Psychiatric:        Mood and  Affect: Mood normal.     CMP Latest Ref Rng & Units 03/26/2020  Glucose 70 - 99 mg/dL 84  BUN 6 - 20 mg/dL 16  Creatinine 0.44 - 1.00 mg/dL 0.76  Sodium 135 - 145 mmol/L 138  Potassium 3.5 - 5.1 mmol/L 3.6  Chloride 98 - 111 mmol/L 103  CO2 22 - 32 mmol/L 25  Calcium 8.9 - 10.3 mg/dL 8.8(L)  Total Protein 6.5 - 8.1 g/dL 8.6(H)  Total Bilirubin 0.3 - 1.2 mg/dL 0.3  Alkaline Phos 38 - 126 U/L 73  AST 15 - 41 U/L 19  ALT 0 - 44 U/L 13   CBC Latest Ref Rng & Units 03/26/2020  WBC 4.0 - 10.5 K/uL 7.6  Hemoglobin 12.0 - 15.0 g/dL 9.8(L)  Hematocrit 36.0 - 46.0 % 32.9(L)  Platelets 150 - 400 K/uL 176     RADIOGRAPHIC STUDIES: I have personally reviewed the radiological images as listed and agreed with the findings in the report. No results found.  LABORATORY DATA:  I have reviewed the data as listed Lab Results  Component Value Date   WBC 7.6 03/26/2020   HGB 9.8 (L) 03/26/2020   HCT 32.9 (L) 03/26/2020   MCV 71.8 (L) 03/26/2020   PLT 176 03/26/2020   Recent Labs    07/02/19 1145 01/14/20 0906 01/30/20 0821 02/13/20 0808 03/26/20 0951  NA 139   < > 139 140 138  K 3.9   < > 3.7 4.2 3.6  CL 102   < > 106 106 103  CO2 27   < > _0 GLUCOSE 91   < > 95 70 84  BUN 14   < > _1 CREATININE 0.69   < > 0.54 0.74 0.76  CALCIUM 8.8*   < > 8.3* 8.6* 8.8*  GFRNONAA  >60   < > >60 >60 >60  GFRAA >60  --   --   --   --   PROT 8.6*   < > 7.3 7.7 8.6*  ALBUMIN 3.5   < > 2.9* 3.1* 3.1*  AST 16   < > _2 ALT 15   < > _3 ALKPHOS 100   < > 78 80 73  BILITOT 0.4   < > 0.3 0.5 0.3   < > = values in this interval not displayed.   Iron/TIBC/Ferritin/ %Sat    Component Value Date/Time   IRON 99 02/13/2020 0808   IRON 94 08/22/2012 1142   TIBC 196 (L) 02/13/2020 0808   TIBC 221 (L) 08/22/2012 1142   FERRITIN 112 02/13/2020 0808   FERRITIN 65 08/22/2012 1142   IRONPCTSAT 51 (H) 02/13/2020 0808   IRONPCTSAT 43 08/22/2012 1142        ASSESSMENT & PLAN:  1. Low grade B-cell lymphoma ([place])   2. 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 Bone marrow biopsy reveals marked involvement of lymphoma. MYD 88 negative,  Cytogenetics showed translocation (4;6) Increased viscosity  at 2.3 S/p 4 rituximab treatments.  Labs were reviewed. Hemoglobin 9.8, stable.  Serum viscosity pending.  #Weight loss, encourage patient to start taking nutrition supplementation daily. . Orders Placed This Encounter  Procedures  . CBC with Differential/Platelet    Standing Status:   Future    Standing Expiration Date:   03/26/2021  . Comprehensive metabolic panel    Standing Status:   Future  Standing Expiration Date:   03/26/2021  . Multiple Myeloma Panel (SPEP&IFE w/QIG)    Standing Status:   Future    Standing Expiration Date:   03/26/2021  . Viscosity, serum    Standing Status:   Future    Standing Expiration Date:   03/26/2021    All questions were answered. The patient knows to call the clinic with any problems questions or concerns.  Return of visit: 3 months  Earlie Server, MD, PhD Hematology Oncology [facility] at [facility] Pager- [phone] 03/26/2020

## 2020-03-30 LAB — MULTIPLE MYELOMA PANEL, SERUM
Albumin SerPl Elph-Mcnc: 3.4 g/dL (ref 2.9–4.4)
Albumin/Glob SerPl: 0.7 (ref 0.7–1.7)
Alpha 1: 0.3 g/dL (ref 0.0–0.4)
Alpha2 Glob SerPl Elph-Mcnc: 0.7 g/dL (ref 0.4–1.0)
B-Globulin SerPl Elph-Mcnc: 3.5 g/dL — ABNORMAL HIGH (ref 0.7–1.3)
Gamma Glob SerPl Elph-Mcnc: 0.6 g/dL (ref 0.4–1.8)
Globulin, Total: 5.1 g/dL — ABNORMAL HIGH (ref 2.2–3.9)
IgA: 96 mg/dL (ref 87–352)
IgG (Immunoglobin G), Serum: 722 mg/dL (ref 586–1602)
IgM (Immunoglobulin M), Srm: 4375 mg/dL — ABNORMAL HIGH (ref 26–217)
M Protein SerPl Elph-Mcnc: 2.8 g/dL — ABNORMAL HIGH
Total Protein ELP: 8.5 g/dL (ref 6.0–8.5)

## 2020-03-30 LAB — VISCOSITY, SERUM: Viscosity, Serum: 3 rel.saline — ABNORMAL HIGH (ref 1.4–2.1)

## 2020-05-11 ENCOUNTER — Ambulatory Visit (INDEPENDENT_AMBULATORY_CARE_PROVIDER_SITE_OTHER): Payer: Medicare Other | Admitting: Dermatology

## 2020-05-11 ENCOUNTER — Other Ambulatory Visit: Payer: Self-pay

## 2020-05-11 DIAGNOSIS — R21 Rash and other nonspecific skin eruption: Secondary | ICD-10-CM

## 2020-05-11 DIAGNOSIS — L905 Scar conditions and fibrosis of skin: Secondary | ICD-10-CM | POA: Diagnosis not present

## 2020-05-11 MED ORDER — MUPIROCIN 2 % EX OINT: TOPICAL_OINTMENT | CUTANEOUS | 2 refills | Status: DC

## 2020-05-11 MED ORDER — TRIAMCINOLONE ACETONIDE 0.1 % EX OINT: 1.0000 "application " | TOPICAL_OINTMENT | Freq: Two times a day (BID) | CUTANEOUS | 2 refills | Status: DC | PRN

## 2020-05-11 NOTE — Progress Notes (Signed)
   New Patient Visit  Subjective  Laurie Rice is a 60 y.o. female who presents for the following: Rash (Pt c/o rash on her legs for several years, treating with Triamcinolone cream, Cetaphil cream and mupirocin ointment with a poor response ). Ulcerated spots pop up suddenly overnight and sometimes heal with scars.  Caretaker with pt contributing to history   The following portions of the chart were reviewed this encounter and updated as appropriate:   Tobacco  Allergies  Meds  Problems  Med Hx  Surg Hx  Fam Hx      Review of Systems:  No other skin or systemic complaints except as noted in HPI or Assessment and Plan.  Objective  Well appearing patient in no apparent distress; mood and affect are within normal limits.  A focused examination was performed including face, bilateral legs . Relevant physical exam findings are noted in the Assessment and Plan.  Objective  Right Breast: scar  Objective  bilateral legs: Hyperpigmented macules and plaques at legs, hyperpigmented plaque with central atrophic scar at left thigh, right pretibial with hyperpigmented plaque with central well demarcated ulceration    Assessment & Plan  Scar Right Breast  Benign-appearing.  Observation.  Call clinic for new or changing lesions.  Recommend daily use of broad spectrum spf 30+ sunscreen to sun-exposed areas.     Rash bilateral legs  Differential diagnosis includes eczema, hypersensitivity, discoid lupus, allergic contact dermatitis or other  Pt with some scars - unclear if due to rash itself or secondary to picking  Chronic condition recurrent over 1 year, currently not at goal.  Apply Mupirocin ointment to open areas on the body- apply first once a day   D/C triamcinolone cream   Start triamcinolone ointment  to itchy rash areas on legs twice a day as needed, do not apply to open areas    Avoid applying to face, groin, and axilla. Use as directed. Risk of skin  atrophy with long-term use reviewed.    Topical steroids (such as triamcinolone, fluocinolone, fluocinonide, mometasone, clobetasol, halobetasol, betamethasone, hydrocortisone) can cause thinning and lightening of the skin if they are used for too long in the same area. Your physician has selected the right strength medicine for your problem and area affected on the body. Please use your medication only as directed by your physician to prevent side effects.     If rash flares call here for stat appointment per Dr Laurence Ferrari. We may consider biopsy if any flares on the upper body and legs.  Other Related Medications mupirocin ointment (BACTROBAN) 2 % triamcinolone ointment (KENALOG) 0.1 %  Return in about 1 month (around 06/11/2020) for rash, if rash flare call here for stat appointment per Dr Laurence Ferrari.  I, Marye Round, CMA, am acting as scribe for Forest Gleason, MD .  Documentation: I have reviewed the above documentation for accuracy and completeness, and I agree with the above.  Forest Gleason, MD

## 2020-05-11 NOTE — Patient Instructions (Addendum)
Apply Mupirocin ointment to open areas on the legs 1-3 times a day- apply to skin first- cover with a band aid  Apply until skin is healed .    Apply triamcinolone ointment  to itchy rash areas twice a day as needed for rash on the body , do not apply to open areas   Avoid applying to face, groin, and axilla. Use as directed. Risk of skin atrophy with long-term use reviewed.   Apply Cetaphil cream to body daily

## 2020-05-12 ENCOUNTER — Other Ambulatory Visit: Payer: Self-pay

## 2020-05-12 DIAGNOSIS — R21 Rash and other nonspecific skin eruption: Secondary | ICD-10-CM

## 2020-05-12 MED ORDER — MUPIROCIN 2 % EX OINT: TOPICAL_OINTMENT | CUTANEOUS | 2 refills | Status: DC

## 2020-05-12 MED ORDER — TRIAMCINOLONE ACETONIDE 0.1 % EX OINT: 1.0000 "application " | TOPICAL_OINTMENT | Freq: Two times a day (BID) | CUTANEOUS | 2 refills | Status: DC | PRN

## 2020-05-13 MED ORDER — MUPIROCIN 2 % EX OINT: TOPICAL_OINTMENT | CUTANEOUS | 2 refills | Status: DC

## 2020-05-13 MED ORDER — TRIAMCINOLONE ACETONIDE 0.1 % EX OINT: TOPICAL_OINTMENT | CUTANEOUS | 2 refills | Status: DC

## 2020-05-13 NOTE — Addendum Note (Signed)
Addended by: Harriett Sine on: 05/13/2020 09:12 AM   Modules accepted: Orders

## 2020-05-17 ENCOUNTER — Encounter: Payer: Self-pay | Admitting: Dermatology

## 2020-06-09 ENCOUNTER — Other Ambulatory Visit: Payer: Self-pay

## 2020-06-09 ENCOUNTER — Ambulatory Visit (INDEPENDENT_AMBULATORY_CARE_PROVIDER_SITE_OTHER): Payer: Medicare Other | Admitting: Dermatology

## 2020-06-09 DIAGNOSIS — R21 Rash and other nonspecific skin eruption: Secondary | ICD-10-CM

## 2020-06-09 DIAGNOSIS — T148XXA Other injury of unspecified body region, initial encounter: Secondary | ICD-10-CM

## 2020-06-09 DIAGNOSIS — S81801A Unspecified open wound, right lower leg, initial encounter: Secondary | ICD-10-CM | POA: Diagnosis not present

## 2020-06-09 NOTE — Progress Notes (Signed)
Follow-Up Visit   Subjective  Laurie Rice is a 60 y.o. female who presents for the following: Rash (Patient here today for 1 month rash follow up at right lower leg. Patient using TMC 0.1% ointment QD and mupirocin QD. There is a new spot that has come up within the last few days. ).  Patient accompanied by caregiver.   Spoke with patient's cousin and power of attorney Kathline Magic [phone]). Discussed biopsy including risks including bleeding, infection, poor healing particularly given the leg location. Advised it may take several weeks to heal. He gives permission to perform biospy for more information.  The following portions of the chart were reviewed this encounter and updated as appropriate:   Tobacco  Allergies  Meds  Problems  Med Hx  Surg Hx  Fam Hx       Review of Systems:  No other skin or systemic complaints except as noted in HPI or Assessment and Plan.  Objective  Well appearing patient in no apparent distress; mood and affect are within normal limits.  A focused examination was performed including legs. Relevant physical exam findings are noted in the Assessment and Plan.  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 Discussed biopsy including risks including bleeding, infection, opening of the wound, and poor healing particularly given the leg location. Advised it may take several weeks to heal. He gives permission to perform biospy for more information.  Skin / nail biopsy - Right Lower Leg Type of biopsy: punch   Informed consent:  discussed and consent obtained   Timeout: patient name, date of birth, surgical site, and procedure verified   Patient was prepped and draped in usual sterile fashion: Area prepped with isopropyl alcohol. Anesthesia: the lesion was anesthetized in a standard fashion   Anesthetic:  1% lidocaine w/ epinephrine 1-100,000 buffered w/ 8.4% NaHCO3 Punch size:  4 mm Hemostasis achieved with: aluminum chloride and Gelfoam   Outcome: patient tolerated procedure well   Post-procedure details: wound care instructions given   Additional details:  Mupirocin and a dressing applied  Specimen 1 - Surgical pathology Differential Diagnosis: r/o livedoid vasculopathy, livedo racemosa, secondary to hyperviscosity, infection, vasculitis  Check Margins: No 0.5cm superficial ulceration with background hyperpigmented reticular plaque  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 Apply until skin is healed .  triamcinolone ointment (KENALOG) 0.1 % Apply twice daily as needed to affected areas for two weeks then apply twice daily on weekends only. Avoid applying to face, groin, and axilla. Use as directed. Risk of skin atrophy with long-term use reviewed.  Open wound Right Lower Leg - Anterior  Apply mupirocin ointment (BACTROBAN) 2 % to open areas on the legs 1-3 times a day- apply to skin first- cover with a band aid. Apply until skin is healed .  Return for 6-8 weeks for rash follow up.  Graciella Belton, RMA, am acting as scribe for Forest Gleason, MD .  Documentation: I have reviewed the above documentation for accuracy and completeness, and I agree with the above.  Forest Gleason, MD

## 2020-06-09 NOTE — Patient Instructions (Signed)

## 2020-06-17 ENCOUNTER — Telehealth: Payer: Self-pay

## 2020-06-17 NOTE — Telephone Encounter (Signed)
Discussed biopsy results with pt care giver, care giver instructed to bring medication list at the follow up appt

## 2020-06-17 NOTE — Telephone Encounter (Signed)
-----   Message from Alfonso Patten, MD sent at 06/17/2020  1:23 PM EDT ----- Skin , right lower leg SUPERFICIAL AND DEEP PERIVASCULAR DERMATITIS WITH EOSINOPHILS "hypersensitivity reaction, possibly drug rash"  At follow-up, please bring list of all current medications and we will review for possible causes.  MAs please call. Thank you!

## 2020-06-18 LAB — ANAEROBIC AND AEROBIC CULTURE

## 2020-06-21 ENCOUNTER — Encounter: Payer: Self-pay | Admitting: Dermatology

## 2020-06-21 ENCOUNTER — Inpatient Hospital Stay: Payer: Medicare Other | Attending: Oncology

## 2020-06-21 DIAGNOSIS — D759 Disease of blood and blood-forming organs, unspecified: Secondary | ICD-10-CM

## 2020-06-21 DIAGNOSIS — C8510 Unspecified B-cell lymphoma, unspecified site: Secondary | ICD-10-CM | POA: Insufficient documentation

## 2020-06-24 ENCOUNTER — Other Ambulatory Visit: Payer: Self-pay

## 2020-06-24 DIAGNOSIS — C8510 Unspecified B-cell lymphoma, unspecified site: Secondary | ICD-10-CM

## 2020-06-25 ENCOUNTER — Inpatient Hospital Stay: Payer: Medicare Other

## 2020-06-25 ENCOUNTER — Other Ambulatory Visit: Payer: Self-pay

## 2020-06-25 DIAGNOSIS — C8510 Unspecified B-cell lymphoma, unspecified site: Secondary | ICD-10-CM

## 2020-06-25 LAB — COMPREHENSIVE METABOLIC PANEL
ALT: 14 U/L (ref 0–44)
AST: 18 U/L (ref 15–41)
Albumin: 3.6 g/dL (ref 3.5–5.0)
Alkaline Phosphatase: 100 U/L (ref 38–126)
Anion gap: 9 (ref 5–15)
BUN: 14 mg/dL (ref 8–23)
CO2: 28 mmol/L (ref 22–32)
Calcium: 9.1 mg/dL (ref 8.9–10.3)
Chloride: 100 mmol/L (ref 98–111)
Creatinine, Ser: 0.64 mg/dL (ref 0.44–1.00)
GFR, Estimated: >60 mL/min (ref >60)
Glucose, Bld: 90 mg/dL (ref 70–99)
Potassium: 4.5 mmol/L (ref 3.5–5.1)
Sodium: 137 mmol/L (ref 135–145)
Total Bilirubin: 0.4 mg/dL (ref 0.3–1.2)
Total Protein: 8.1 g/dL (ref 6.5–8.1)

## 2020-06-25 LAB — CBC WITH DIFFERENTIAL/PLATELET
Abs Immature Granulocytes: 0.02 10*3/uL (ref 0.00–0.07)
Basophils Absolute: 0.1 10*3/uL (ref 0.0–0.1)
Basophils Relative: 1 %
Eosinophils Absolute: 0.1 10*3/uL (ref 0.0–0.5)
Eosinophils Relative: 1 %
HCT: 37.9 % (ref 36.0–46.0)
Hemoglobin: 11.4 g/dL — ABNORMAL LOW (ref 12.0–15.0)
Immature Granulocytes: 0 %
Lymphocytes Relative: 26 %
Lymphs Abs: 2 10*3/uL (ref 0.7–4.0)
MCH: 21.5 pg — ABNORMAL LOW (ref 26.0–34.0)
MCHC: 30.1 g/dL (ref 30.0–36.0)
MCV: 71.4 fL — ABNORMAL LOW (ref 80.0–100.0)
Monocytes Absolute: 0.6 10*3/uL (ref 0.1–1.0)
Monocytes Relative: 8 %
Neutro Abs: 5 10*3/uL (ref 1.7–7.7)
Neutrophils Relative %: 64 %
Platelets: 222 10*3/uL (ref 150–400)
RBC: 5.31 MIL/uL — ABNORMAL HIGH (ref 3.87–5.11)
RDW: 16.1 % — ABNORMAL HIGH (ref 11.5–15.5)
WBC: 7.7 10*3/uL (ref 4.0–10.5)
nRBC: 0 % (ref 0.0–0.2)

## 2020-06-28 ENCOUNTER — Inpatient Hospital Stay: Payer: Medicare Other | Admitting: Oncology

## 2020-06-28 LAB — MULTIPLE MYELOMA PANEL, SERUM
Albumin SerPl Elph-Mcnc: 4 g/dL (ref 2.9–4.4)
Albumin/Glob SerPl: 0.9 (ref 0.7–1.7)
Alpha 1: 0.3 g/dL (ref 0.0–0.4)
Alpha2 Glob SerPl Elph-Mcnc: 1 g/dL (ref 0.4–1.0)
B-Globulin SerPl Elph-Mcnc: 0.8 g/dL (ref 0.7–1.3)
Gamma Glob SerPl Elph-Mcnc: 2.7 g/dL — ABNORMAL HIGH (ref 0.4–1.8)
Globulin, Total: 4.8 g/dL — ABNORMAL HIGH (ref 2.2–3.9)
IgA: 65 mg/dL — ABNORMAL LOW (ref 87–352)
IgG (Immunoglobin G), Serum: 746 mg/dL (ref 586–1602)
IgM (Immunoglobulin M), Srm: 2860 mg/dL — ABNORMAL HIGH (ref 26–217)
M Protein SerPl Elph-Mcnc: 2 g/dL — ABNORMAL HIGH
Total Protein ELP: 8.8 g/dL — ABNORMAL HIGH (ref 6.0–8.5)

## 2020-06-29 LAB — VISCOSITY, SERUM: Viscosity, Serum: 2.4 rel.saline — ABNORMAL HIGH (ref 1.4–2.1)

## 2020-07-07 ENCOUNTER — Inpatient Hospital Stay: Payer: Medicare Other | Attending: Oncology | Admitting: Oncology

## 2020-07-07 ENCOUNTER — Encounter: Payer: Self-pay | Admitting: Oncology

## 2020-07-07 VITALS — BP 126/75 | HR 65 | Temp 97.3°F | Resp 18 | Wt 196.0 lb

## 2020-07-07 DIAGNOSIS — C8519 Unspecified B-cell lymphoma, extranodal and solid organ sites: Secondary | ICD-10-CM | POA: Insufficient documentation

## 2020-07-07 DIAGNOSIS — C8510 Unspecified B-cell lymphoma, unspecified site: Secondary | ICD-10-CM

## 2020-07-07 DIAGNOSIS — D759 Disease of blood and blood-forming organs, unspecified: Secondary | ICD-10-CM | POA: Diagnosis not present

## 2020-07-07 NOTE — Progress Notes (Signed)
Hematology/Oncology Progress Note [facility] Telephone:([phone] [phone]   Patient Care Team: Juluis Pitch, MD as PCP - General (Family Medicine)  REFERRING PROVIDER: Juluis Pitch, MD  CHIEF COMPLAINTS/REASON FOR VISIT:  Follow-up for lymphoma  HISTORY OF PRESENTING ILLNESS:  Laurie Rice is a  60 y.o.  female with PMH listed below follows up for lymphoma 06/10/2017 CBC showed elevated white count of 38.5, hemoglobin 10.2, MCV 74.7, absolute lymphocyte 30.4, basophil 0.13, Previous lab records reviewed. Leukocytosis onset of chronic, duration is since at least 2014 Anemia is also chronic since at least 2016 No aggravating or elevated factors. Patient is a poor historian.  She has a seizure disorder and meningioma.  She is not medical competent and has legal guardian.  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    Associated symptoms or signs:  Denies weight loss, fever, chills,night sweats.  Recent history of weight loss about 10 pounds over the past 1 year.  Smoking history: Never smoking History of recent oral steroid use or steroid injection: Denies History of recent infection: Denies Autoimmune disease history.  Denies  Reviewed patient's past medical history, was previously seen by Dr. Mike Gip with last visit more than 3 years ago.  Today she wants to reestablish care at [facility] site. 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  Phenotype did not suggest a specific histologic type and was not typical for CLL/SLL, mantle cell lymphoma, hairy cell leukemia or follicular cell lymphoma.  BCR ABL testing was negative.  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  Chronic microcytic anemia since 2008.  She had hemoglobin electrophoresis reviewed normal hemoglobin F and A2 Patient has a history of  bilateral DVT and pulmonary embolism in 2010.  Has been on Coumadin chronically She developed right lower extremity DVT on 07/09/2014 secondary to subtherapeutic INR  01/30/2020- 02/20/2020 weekly rituximab treatment x 4  INTERVAL HISTORY Laurie Rice is a 60 y.o. female who has above history reviewed by me today presents for follow up visit for lymphoma. Problems and complaints are listed below: Patient was accompanied by caregiver today.   No reported new complaints.  Patient is a poor historian.  Weight is stable.   .Review of Systems  Unable to perform ROS: Other (Intellectual disability)  Constitutional:  Positive for unexpected weight change. Negative for fatigue.    MEDICAL HISTORY:  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   History of lymphocytosis    Impetigo    History of   Liver masses    History of, Benign   Meningioma (HCC)    Mild mental retardation    Monoclonal gammopathy    low level   Personal history of pulmonary embolism    Seizures (HCC)    Thyroid mass    history of    SURGICAL HISTORY: Past Surgical History:  Procedure Laterality Date   NO PAST SURGERIES      SOCIAL HISTORY: Social History   Socioeconomic History   Marital status: Single    Spouse name: Not on file   Number of children: Not on file   Years of education: Not on file   Highest education level: Not on file  Occupational  History   Not on file  Tobacco Use   Smoking status: Never   Smokeless tobacco: Never  Substance and Sexual Activity   Alcohol use: No   Drug use: No   Sexual activity: Not on file  Other Topics Concern   Not on file  Social History Narrative   Not on file   Social Determinants of Health   Financial Resource Strain: Not on file  Food Insecurity: Not on file  Transportation Needs: Not on file  Physical Activity:  Not on file  Stress: Not on file  Social Connections: Not on file  Intimate Partner Violence: Not on file    FAMILY HISTORY: Family History  Problem Relation Age of Onset   Hypertension Other    Arthritis Other    Breast cancer Neg Hx     ALLERGIES:  has No Known Allergies.  MEDICATIONS:  Current Outpatient Medications  Medication Sig Dispense Refill   bisoprolol-hydrochlorothiazide (ZIAC) 2.5-6.25 MG tablet Take 2 tablets by mouth daily.     ferrous sulfate 325 (65 FE) MG EC tablet Take 1 tablet (325 mg total) by mouth daily with breakfast. With orange juice 30 tablet 3   folic acid (FOLVITE) 1 MG tablet TAKE ONE TABLET BY MOUTH EACH DAY.     mupirocin ointment (BACTROBAN) 2 % to open areas on the legs 1-3 times a day- apply to skin first- cover with a band aid. Apply until skin is healed . 22 g 2   PHENobarbital (LUMINAL) 32.4 MG tablet Take 60 mg by mouth at bedtime.      phenytoin (DILANTIN) 100 MG ER capsule TAKE 2 CAPSULES BY MOUTH AT BEDTIME (SEIZURE DISORDER)     phenytoin (DILANTIN) 50 MG tablet Chew 50 mg by mouth at bedtime.     triamcinolone ointment (KENALOG) 0.1 % Apply twice daily as needed to affected areas for two weeks then apply twice daily on weekends only. Avoid applying to face, groin, and axilla. Use as directed. Risk of skin atrophy with long-term use reviewed. 80 g 2   warfarin (COUMADIN) 1 MG tablet TAKE 2 TABLETS (2MG) BY MOUTH ONCE DAILY (ALONG W/ 5MG=7MG TOTAL DOSAGE).     warfarin (COUMADIN) 5 MG tablet TAKE 1 TABLET BY MOUTH EVERY DAY (ALONG W/ 2MG=7MG TOTAL DOSAGE).     Emollient (CETAPHIL) cream APPLY SMALL AMOUNT TO AFFECTED AREAS ONCE DAILY     No current facility-administered medications for this visit.     PHYSICAL EXAMINATION: ECOG PERFORMANCE STATUS: 1 - Symptomatic but completely ambulatory Vitals:   07/07/20 1410  BP: 126/75  Pulse: 65  Resp: 18  Temp: (!) 97.3 F (36.3 C)   Filed Weights   07/07/20 1410  Weight: 196 lb (88.9 kg)     Physical Exam Constitutional:      General: She is not in acute distress. HENT:     Head: Normocephalic and atraumatic.  Eyes:     General: No scleral icterus. Cardiovascular:     Rate and Rhythm: Normal rate and regular rhythm.     Heart sounds: Normal heart sounds.  Pulmonary:     Effort: Pulmonary effort is normal. No respiratory distress.     Breath sounds: No wheezing.  Abdominal:     General: Bowel sounds are normal. There is no distension.     Palpations: Abdomen is soft.  Musculoskeletal:        General: No deformity. Normal range of motion.     Cervical back: Normal range of motion  and neck supple.  Skin:    General: Skin is warm and dry.     Findings: No erythema or rash.  Neurological:     Mental Status: She is alert. Mental status is at baseline.  Psychiatric:        Mood and Affect: Mood normal.    CMP Latest Ref Rng & Units 06/25/2020  Glucose 70 - 99 mg/dL 90  BUN 8 - 23 mg/dL 14  Creatinine 0.44 - 1.00 mg/dL 0.64  Sodium 135 - 145 mmol/L 137  Potassium 3.5 - 5.1 mmol/L 4.5  Chloride 98 - 111 mmol/L 100  CO2 22 - 32 mmol/L 28  Calcium 8.9 - 10.3 mg/dL 9.1  Total Protein 6.5 - 8.1 g/dL 8.1  Total Bilirubin 0.3 - 1.2 mg/dL 0.4  Alkaline Phos 38 - 126 U/L 100  AST 15 - 41 U/L 18  ALT 0 - 44 U/L 14   CBC Latest Ref Rng & Units 06/25/2020  WBC 4.0 - 10.5 K/uL 7.7  Hemoglobin 12.0 - 15.0 g/dL 11.4(L)  Hematocrit 36.0 - 46.0 % 37.9  Platelets 150 - 400 K/uL 222     RADIOGRAPHIC STUDIES: I have personally reviewed the radiological images as listed and agreed with the findings in the report. No results found.  LABORATORY DATA:  I have reviewed the data as listed Lab Results  Component Value Date   WBC 7.7 06/25/2020   HGB 11.4 (L) 06/25/2020   HCT 37.9 06/25/2020   MCV 71.4 (L) 06/25/2020   PLT 222 06/25/2020   Recent Labs    02/13/20 0808 03/26/20 0951 06/25/20 1127  NA 140 138 137  K 4.2 3.6 4.5  CL 106 103 100  CO2 '24 25 28   ' GLUCOSE 70 84 90  BUN '15 16 14  ' CREATININE 0.74 0.76 0.64  CALCIUM 8.6* 8.8* 9.1  GFRNONAA >60 >60 >60  PROT 7.7 8.6* 8.1  ALBUMIN 3.1* 3.1* 3.6  AST '21 19 18  ' ALT '14 13 14  ' ALKPHOS 80 73 100  BILITOT 0.5 0.3 0.4    Iron/TIBC/Ferritin/ %Sat    Component Value Date/Time   IRON 99 02/13/2020 0808   IRON 94 08/22/2012 1142   TIBC 196 (L) 02/13/2020 0808   TIBC 221 (L) 08/22/2012 1142   FERRITIN 112 02/13/2020 0808   FERRITIN 65 08/22/2012 1142   IRONPCTSAT 51 (H) 02/13/2020 0808   IRONPCTSAT 43 08/22/2012 1142        ASSESSMENT & PLAN:  1. Low grade B-cell lymphoma ([place])   2. 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 Bone marrow biopsy reveals marked involvement of lymphoma.   S/p 4 rituximab treatments.  Labs are reviewed and discussed with patient. Hb has improved to 11.4, viscosity decreased to 2.4 Continue observation.    Orders Placed This Encounter  Procedures   CBC with Differential/Platelet    Standing Status:   Future    Standing Expiration Date:   07/07/2021   Comprehensive metabolic panel    Standing Status:   Future    Standing Expiration Date:   07/07/2021   Kappa/lambda light chains    Standing Status:   Future    Standing Expiration Date:   07/07/2021   Multiple Myeloma Panel (SPEP&IFE w/QIG)    Standing Status:   Future    Standing Expiration Date:   07/07/2021   Viscosity, serum    Standing Status:  Future    Standing Expiration Date:   07/07/2021    All questions were answered. The patient knows to call the clinic with any problems questions or concerns.  Return of visit: 6 months  Earlie Server, MD, PhD Hematology Oncology [facility] Pager- [phone] 07/07/2020

## 2020-07-28 ENCOUNTER — Other Ambulatory Visit: Payer: Self-pay

## 2020-07-28 ENCOUNTER — Encounter: Payer: Self-pay | Admitting: Dermatology

## 2020-07-28 ENCOUNTER — Ambulatory Visit (INDEPENDENT_AMBULATORY_CARE_PROVIDER_SITE_OTHER): Payer: Medicare Other | Admitting: Dermatology

## 2020-07-28 DIAGNOSIS — A4902 Methicillin resistant Staphylococcus aureus infection, unspecified site: Secondary | ICD-10-CM

## 2020-07-28 DIAGNOSIS — R21 Rash and other nonspecific skin eruption: Secondary | ICD-10-CM | POA: Diagnosis not present

## 2020-07-28 MED ORDER — CLINDAMYCIN HCL 150 MG PO CAPS: 450.0000 mg | ORAL_CAPSULE | Freq: Three times a day (TID) | ORAL | 0 refills | Status: AC

## 2020-07-28 MED ORDER — DOXYCYCLINE MONOHYDRATE 100 MG PO CAPS: 100.0000 mg | ORAL_CAPSULE | Freq: Two times a day (BID) | ORAL | 0 refills | Status: DC

## 2020-07-28 NOTE — Patient Instructions (Addendum)
Start clindamycin 450 mg 3 times per day for 10 days. Recommend taking with a probiotic supplement or container of yogurt daily to help restore the gut bacteria and prevent future infections.  Start mupirocin ointment thin layer twice daily to inside of nose for 10 days.  Continue mupirocin ointment 3 times per day to open areas at legs.  MRSA is a highly contagious bacterial infection.  Take oral antibiotic as prescribed.  After cleansing infected area, apply mupirocin ointment and cover with bandaid twice daily.  Wash hands after wound care.  Recommend OTC Hibiclens as body wash (including skin folds, avoiding face/eye/ear area) daily in shower, let sit couple minutes prior to rinsing.       If you have any questions or concerns for your doctor, please call our main line at [phone] and press option 4 to reach your doctor's medical assistant. If no one answers, please leave a voicemail as directed and we will return your call as soon as possible. Messages left after 4 pm will be answered the following business day.   You may also send [name] a message via [place]. We typically respond to MyChart messages within 1-2 business days.  For prescription refills, please ask your pharmacy to contact our office. Our fax number is [phone].  If you have an urgent issue when the clinic is closed that cannot wait until the next business day, you can page your doctor at the number below.    Please note that while we do our best to be available for urgent issues outside of office hours, we are not available 24/7.   If you have an urgent issue and are unable to reach [name], you may choose to seek medical care at your doctor's office, retail clinic, urgent care center, or emergency room.  If you have a medical emergency, please immediately call 911 or go to the emergency department.  Pager Numbers  - Dr. Nehemiah Massed: [phone]  - Dr. Laurence Ferrari: [phone]  - Dr. Nicole Kindred: [phone]  In the event  of inclement weather, please call our main line at [phone] for an update on the status of any delays or closures.  Dermatology Medication Tips: Please keep the boxes that topical medications come in in order to help keep track of the instructions about where and how to use these. Pharmacies typically print the medication instructions only on the boxes and not directly on the medication tubes.   If your medication is too expensive, please contact our office at [phone] option 4 or send [name] a message through [place].   We are unable to tell what your co-pay for medications will be in advance as this is different depending on your insurance coverage. However, we may be able to find a substitute medication at lower cost or fill out paperwork to get insurance to cover a needed medication.   If a prior authorization is required to get your medication covered by your insurance company, please allow [name] 1-2 business days to complete this process.  Drug prices often vary depending on where the prescription is filled and some pharmacies may offer cheaper prices.  The website www.goodrx.com contains coupons for medications through different pharmacies. The prices here do not account for what the cost may be with help from insurance (it may be cheaper with your insurance), but the website can give you the price if you did not use any insurance.  - You can print the associated coupon and take it with your prescription to the pharmacy.  -  You may also stop by our office during regular business hours and pick up a GoodRx coupon card.  - If you need your prescription sent electronically to a different pharmacy, notify our office through [facility] or by phone at [phone] option 4.

## 2020-07-28 NOTE — Progress Notes (Signed)
   Follow-Up Visit   Subjective  Laurie Rice is a 60 y.o. female who presents for the following: Follow-up (Patient here today with caregiver for rash follow up. ).  Patient notes she still has lesions.   The following portions of the chart were reviewed this encounter and updated as appropriate:   Tobacco  Allergies  Meds  Problems  Med Hx  Surg Hx  Fam Hx      Review of Systems:  No other skin or systemic complaints except as noted in HPI or Assessment and Plan.  Objective  Well appearing patient in no apparent distress; mood and affect are within normal limits.  A focused examination was performed including bilateral lower extremities and arms. Relevant physical exam findings are noted in the Assessment and Plan.  Right Lower Leg - Anterior Crusted ulcerations with focal surrounding erythema   Assessment & Plan  Rash and other nonspecific skin eruption Right Lower Leg - Anterior  Discussed with patient's cousin and power of attorney Laurie Rice who is in agreement with plan.  Chronic recurrent condition (>1 year) Culture showed MRSA - will start therapy with clindamycin 450 mg TID (doxycycline contraindicated due to medication interaction) x 10 days. Recommend to take with yogurt or probiotic daily.  Start mupirocin ointment thin layer twice daily to inside of nose for 10 days.  Continue mupirocin ointment 3 times per day to open areas at legs.  MRSA is a highly contagious bacterial infection.  Take oral antibiotic as prescribed.  After cleansing infected area, apply mupirocin ointment and cover with bandaid twice daily.  Wash hands after wound care.  Recommend OTC Hibiclens as body wash (including skin folds, avoiding face/eye/ear area) daily in shower, let sit couple minutes prior to rinsing.    Biopsy showed SUPERFICIAL AND DEEP PERIVASCULAR DERMATITIS WITH EOSINOPHILS c/w hypersensitivity reaction  If rash not clearing after treatment of MRSA, may consider  adding insect repellant for any outdoor activities. Drug reaction less likely given clinical presentation       Anaerobic and Aerobic Culture - Right Lower Leg - Anterior  MRSA infection Left Lower Leg - Anterior    Return for 4 - 6 weeks follow up on rash .  Documentation: I have reviewed the above documentation for accuracy and completeness, and I agree with the above.  Forest Gleason, MD

## 2020-07-28 NOTE — Progress Notes (Signed)
Entered in error

## 2020-08-04 LAB — ANAEROBIC AND AEROBIC CULTURE

## 2020-08-05 ENCOUNTER — Telehealth: Payer: Self-pay

## 2020-08-05 NOTE — Telephone Encounter (Signed)
Spoke with nurse at home and advised culture results. Patient to continue course of clindamycin, continue mupirocin to nares for remainder of 10 days and continue mupirocin to any open areas and cover.Cherly Hensen

## 2020-08-05 NOTE — Telephone Encounter (Signed)
-----   Message from [place], MD sent at 08/04/2020 10:35 AM EDT ----- MRSA sensitive to clindamycin - finish course of clindamycin and continue mupirocin antibiotic to open areas Beta hemolytic strep light growth - likely not causing infection. Will not treat at this time but will reconsider if lesions not healing well despite clindamycin and mupirocin  MAs please call. Thank you!

## 2020-09-16 ENCOUNTER — Other Ambulatory Visit: Payer: Self-pay

## 2020-09-16 ENCOUNTER — Telehealth: Payer: Self-pay

## 2020-09-16 ENCOUNTER — Ambulatory Visit (INDEPENDENT_AMBULATORY_CARE_PROVIDER_SITE_OTHER): Payer: Medicare Other | Admitting: Dermatology

## 2020-09-16 DIAGNOSIS — L0291 Cutaneous abscess, unspecified: Secondary | ICD-10-CM

## 2020-09-16 DIAGNOSIS — R21 Rash and other nonspecific skin eruption: Secondary | ICD-10-CM

## 2020-09-16 DIAGNOSIS — L02219 Cutaneous abscess of trunk, unspecified: Secondary | ICD-10-CM | POA: Diagnosis not present

## 2020-09-16 MED ORDER — MUPIROCIN 2 % EX OINT: TOPICAL_OINTMENT | CUTANEOUS | 2 refills | Status: DC

## 2020-09-16 MED ORDER — CHLORHEXIDINE GLUCONATE 4 % EX LIQD: CUTANEOUS | 2 refills | Status: DC

## 2020-09-16 NOTE — Progress Notes (Signed)
   Follow-Up Visit   Subjective  Laurie Rice is a 60 y.o. female who presents for the following: recheck rash (Patient has had new lesions appear on her right breast and right side and states the lesions on her upper legs have resolved). She completed the course of Clindamycin and nasal Mupirocin 2% ointment. They did not start chlorhexidine.  The following portions of the chart were reviewed this encounter and updated as appropriate:   Tobacco  Allergies  Meds  Problems  Med Hx  Surg Hx  Fam Hx      Review of Systems:  No other skin or systemic complaints except as noted in HPI or Assessment and Plan.  Objective  Well appearing patient in no apparent distress; mood and affect are within normal limits.  A focused examination was performed including the right breast, right side, and face. Relevant physical exam findings are noted in the Assessment and Plan.  R lower leg, R breast, R side Crusted ulceration on the R side and and R lower leg. Lesion on the R breast has resolved.  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   Continue Mupirocin 2% ointment to wounds increasing to three times daily until healed.   Pending culture results we may add Mupirocin 2% ointment to the inside of the nose twice daily, but we will add that only if culture results are positive for MRSA.  Will defer PO therapy at this time to avoid overuse of antibiotics  mupirocin ointment (BACTROBAN) 2 % - R lower leg, R breast, R side Apply to wounds TID until healed.  chlorhexidine (HIBICLENS) 4 % external liquid - R lower leg, R breast, R side Wash from the neck down excluding the vagina every other day while showering. Do not get in the eyes.  Anaerobic and Aerobic Culture - R lower leg, R breast, R  side  Anaerobic and Aerobic Culture - R lower leg, R breast, R side  Abscess Right Flank  Permission obtained from patient's guardian, her cousin, before I&D performed  Incision and Drainage - Right Flank Location: R side   Informed Consent: Discussed risks (permanent scarring, light or dark discoloration, infection, pain, bleeding, bruising, redness, damage to adjacent structures, and recurrence of the lesion) and benefits of the procedure, as well as the alternatives.  Informed consent was obtained.  Preparation: The area was prepped with alcohol.  Anesthesia: Lidocaine 1% with epinephrine  Procedure Details: An incision was made overlying the lesion. The lesion drained pus.  A small amount of fluid was drained.    Antibiotic ointment and a sterile pressure dressing were applied. The patient tolerated procedure well.  Total number of lesions drained: 1  Plan: The patient was instructed on post-op care. Recommend OTC analgesia as needed for pain.    Return in about 8 weeks (around 11/11/2020).  Luther Redo, CMA, am acting as scribe for Forest Gleason, MD .  Documentation: I have reviewed the above documentation for accuracy and completeness, and I agree with the above.  Forest Gleason, MD

## 2020-09-16 NOTE — Patient Instructions (Addendum)
Skin infection  Previous cultures have shown MRSA bacteria. Start chlorhexidine wash every 2 days from the neck down avoiding the vaginal area. Do not get in the eyes or ears.   We repeated a culture today to check for bacteria in the nose and also at one of the sores. We also drained one of the sores today at her right side. For that spot, put on mupirocin and cover with a bandaid once a day until healed.   For all other sores on the body, increase mupirocin ointment to three times per day to any sores on the body.   We will not treat with a pill antibiotic again at this time due to risk of side effects, but if she starts getting more spots or does not improve with adding the wash, we may consider treating with an antibiotic pill again.    If you have any questions or concerns for your doctor, please call our main line at [phone] and press option 4 to reach your doctor's medical assistant. If no one answers, please leave a voicemail as directed and we will return your call as soon as possible. Messages left after 4 pm will be answered the following business day.   You may also send [name] a message via [place]. We typically respond to MyChart messages within 1-2 business days.  For prescription refills, please ask your pharmacy to contact our office. Our fax number is [phone].  If you have an urgent issue when the clinic is closed that cannot wait until the next business day, you can page your doctor at the number below.    Please note that while we do our best to be available for urgent issues outside of office hours, we are not available 24/7.   If you have an urgent issue and are unable to reach [name], you may choose to seek medical care at your doctor's office, retail clinic, urgent care center, or emergency room.  If you have a medical emergency, please immediately call 911 or go to the emergency department.  Pager Numbers  - Dr. Nehemiah Massed: [phone]  - Dr. Laurence Ferrari:  [phone]  - Dr. Nicole Kindred: [phone]  In the event of inclement weather, please call our main line at [phone] for an update on the status of any delays or closures.  Dermatology Medication Tips: Please keep the boxes that topical medications come in in order to help keep track of the instructions about where and how to use these. Pharmacies typically print the medication instructions only on the boxes and not directly on the medication tubes.   If your medication is too expensive, please contact our office at [phone] option 4 or send [name] a message through [place].   We are unable to tell what your co-pay for medications will be in advance as this is different depending on your insurance coverage. However, we may be able to find a substitute medication at lower cost or fill out paperwork to get insurance to cover a needed medication.   If a prior authorization is required to get your medication covered by your insurance company, please allow [name] 1-2 business days to complete this process.  Drug prices often vary depending on where the prescription is filled and some pharmacies may offer cheaper prices.  The website www.goodrx.com contains coupons for medications through different pharmacies. The prices here do not account for what the cost may be with help from insurance (it may be cheaper with your insurance), but the website can give  you the price if you did not use any insurance.  - You can print the associated coupon and take it with your prescription to the pharmacy.  - You may also stop by our office during regular business hours and pick up a GoodRx coupon card.  - If you need your prescription sent electronically to a different pharmacy, notify our office through [facility] or by phone at [phone] option 4.  Start Hibiclens wash once daily or every other day from the next down except the vagina. Continue Mupirocin 2% ointment to wounds three times daily until  healed. Pending culture results we may add Mupirocin 2% ointment to the inside of the nose twice daily, but we will add that only if culture results are positive for MRSA.

## 2020-09-16 NOTE — Telephone Encounter (Signed)
[name] advised of information per Dr. Laurence Ferrari.

## 2020-09-16 NOTE — Telephone Encounter (Signed)
Left message on voicemail to return my call. Per Dr. Laurence Ferrari recurrent and persistent infections of MRSA not caused by unclean facility, rather colonization on the skin that has not yet been resolved with treatment and keeps recurring.

## 2020-09-27 ENCOUNTER — Telehealth: Payer: Self-pay | Admitting: Dermatology

## 2020-09-27 ENCOUNTER — Encounter: Payer: Self-pay | Admitting: Dermatology

## 2020-09-27 NOTE — Telephone Encounter (Signed)
Can you please call [facility] to follow-up on culture results for this patient? They should have resulted by now. Thank you!

## 2020-09-29 ENCOUNTER — Telehealth: Payer: Self-pay

## 2020-09-29 LAB — ANAEROBIC AND AEROBIC CULTURE

## 2020-09-29 NOTE — Telephone Encounter (Signed)
-----   Message from Alfonso Patten, MD sent at 09/29/2020  5:25 PM EDT ----- Cultured showed MRSA resistant to clindamycin, doxycycline, sensitive to TMP-SMX.  Recommend using mupirocin thin layer to nares twice a day for 10 days and doing daily hibiclens wash from the neck down (avoiding the vagina). If she continues to get new spots, they should call and we may considering another course of pill antibiotics.   MAs please call. Thank you!

## 2020-09-30 ENCOUNTER — Telehealth: Payer: Self-pay

## 2020-09-30 NOTE — Telephone Encounter (Signed)
-----   Message from Alfonso Patten, MD sent at 09/29/2020  5:25 PM EDT ----- Cultured showed MRSA resistant to clindamycin, doxycycline, sensitive to TMP-SMX.  Recommend using mupirocin thin layer to nares twice a day for 10 days and doing daily hibiclens wash from the neck down (avoiding the vagina). If she continues to get new spots, they should call and we may considering another course of pill antibiotics.   MAs please call. Thank you!

## 2020-09-30 NOTE — Telephone Encounter (Signed)
Spoke with [name] today and advised him of information per Dr. Laurence Ferrari.  New Orders were faxed to patient's home regarding medications and to call if new spots show so we can consider another course of antibiotics.   Faxed to [phone]

## 2020-10-01 LAB — ANAEROBIC AND AEROBIC CULTURE

## 2020-10-05 ENCOUNTER — Other Ambulatory Visit: Payer: Self-pay | Admitting: Dermatology

## 2020-10-05 ENCOUNTER — Telehealth: Payer: Self-pay

## 2020-10-05 DIAGNOSIS — R21 Rash and other nonspecific skin eruption: Secondary | ICD-10-CM

## 2020-10-05 NOTE — Telephone Encounter (Addendum)
Opened in error

## 2020-10-14 ENCOUNTER — Telehealth: Payer: Self-pay

## 2020-10-14 ENCOUNTER — Other Ambulatory Visit: Payer: Self-pay | Admitting: Dermatology

## 2020-10-14 NOTE — Telephone Encounter (Signed)
ERROR

## 2020-10-14 NOTE — Telephone Encounter (Signed)
Patient's caregiver called stating the most recent area is not improving. They have called PCP and she is being seen tomorrow.  [name] is ready for patient to be admitted into a hospital and treated by infectious disease.

## 2020-10-14 NOTE — Telephone Encounter (Signed)
Spoke with her guardian, Mr. Berenice Primas who is in agreement with plan. He also gives permission in advance for any needed treatment such as I&D or starting a medication to treat her condition if he is unable to be reached at the time of her visits.

## 2020-10-14 NOTE — Telephone Encounter (Addendum)
Thank you. I asked them to call if areas did not clear with mupirocin and hibiclens. We did not immediately start a pill antibiotic as the only good antibiotic option covering her strain of MRSA bacteria has an interaction with her warfarin (blood thinner medicine).    Since she has not cleared up though, she will need to take the pill medicine (Bactrim DS). Her dose of warfarin will need to be decreased and her blood work checked more often while taking this medicine.   Since she is seeing PCP tomorrow, I recommend waiting to let PCP start this tomorrow as they can adjust her warfarin dose and plan follow-up for warfarin at the same time. Please notify patient's facility and healthcare POA. Thank you!   CC Dr. Lovie Macadamia, PCP

## 2020-10-18 ENCOUNTER — Telehealth: Payer: Self-pay

## 2020-10-18 NOTE — Telephone Encounter (Signed)
Called pts guardian to reschedule tomorrows appt to either today 10/17 at 3:30 with Dr. Nehemiah Massed or tomorrow 10/18 at 10:00 with Dr. Laurence Ferrari.

## 2020-10-19 ENCOUNTER — Emergency Department
Admission: EM | Admit: 2020-10-19 | Discharge: 2020-10-19 | Disposition: A | Discharge status: Home / Self Care | Payer: Medicare Other | Attending: Emergency Medicine | Admitting: Emergency Medicine

## 2020-10-19 ENCOUNTER — Telehealth: Payer: Self-pay

## 2020-10-19 ENCOUNTER — Encounter: Payer: Self-pay | Admitting: Emergency Medicine

## 2020-10-19 ENCOUNTER — Encounter: Payer: Self-pay | Admitting: Dermatology

## 2020-10-19 ENCOUNTER — Ambulatory Visit: Payer: Medicare Other | Admitting: Dermatology

## 2020-10-19 ENCOUNTER — Other Ambulatory Visit: Payer: Self-pay

## 2020-10-19 ENCOUNTER — Ambulatory Visit (INDEPENDENT_AMBULATORY_CARE_PROVIDER_SITE_OTHER): Payer: Medicare Other | Admitting: Dermatology

## 2020-10-19 DIAGNOSIS — L089 Local infection of the skin and subcutaneous tissue, unspecified: Secondary | ICD-10-CM | POA: Diagnosis not present

## 2020-10-19 DIAGNOSIS — Z20822 Contact with and (suspected) exposure to covid-19: Secondary | ICD-10-CM | POA: Insufficient documentation

## 2020-10-19 DIAGNOSIS — Z7901 Long term (current) use of anticoagulants: Secondary | ICD-10-CM | POA: Diagnosis not present

## 2020-10-19 DIAGNOSIS — L308 Other specified dermatitis: Secondary | ICD-10-CM | POA: Diagnosis not present

## 2020-10-19 DIAGNOSIS — L03116 Cellulitis of left lower limb: Secondary | ICD-10-CM

## 2020-10-19 DIAGNOSIS — A4902 Methicillin resistant Staphylococcus aureus infection, unspecified site: Secondary | ICD-10-CM | POA: Diagnosis not present

## 2020-10-19 LAB — CBC WITH DIFFERENTIAL/PLATELET
Abs Immature Granulocytes: 0.02 10*3/uL (ref 0.00–0.07)
Basophils Absolute: 0 10*3/uL (ref 0.0–0.1)
Basophils Relative: 1 %
Eosinophils Absolute: 0.4 10*3/uL (ref 0.0–0.5)
Eosinophils Relative: 4 %
HCT: 36.8 % (ref 36.0–46.0)
Hemoglobin: 11.4 g/dL — ABNORMAL LOW (ref 12.0–15.0)
Immature Granulocytes: 0 %
Lymphocytes Relative: 25 %
Lymphs Abs: 2.1 10*3/uL (ref 0.7–4.0)
MCH: 22.3 pg — ABNORMAL LOW (ref 26.0–34.0)
MCHC: 31 g/dL (ref 30.0–36.0)
MCV: 71.9 fL — ABNORMAL LOW (ref 80.0–100.0)
Monocytes Absolute: 0.5 10*3/uL (ref 0.1–1.0)
Monocytes Relative: 6 %
Neutro Abs: 5.4 10*3/uL (ref 1.7–7.7)
Neutrophils Relative %: 64 %
Platelets: 247 10*3/uL (ref 150–400)
RBC: 5.12 MIL/uL — ABNORMAL HIGH (ref 3.87–5.11)
RDW: 15.9 % — ABNORMAL HIGH (ref 11.5–15.5)
WBC: 8.4 10*3/uL (ref 4.0–10.5)
nRBC: 0 % (ref 0.0–0.2)

## 2020-10-19 LAB — RESP PANEL BY RT-PCR (FLU A&B, COVID) ARPGX2
Influenza A by PCR: NEGATIVE
Influenza B by PCR: NEGATIVE
SARS Coronavirus 2 by RT PCR: NEGATIVE

## 2020-10-19 LAB — BASIC METABOLIC PANEL
Anion gap: 10 (ref 5–15)
BUN: 18 mg/dL (ref 8–23)
CO2: 25 mmol/L (ref 22–32)
Calcium: 9.2 mg/dL (ref 8.9–10.3)
Chloride: 103 mmol/L (ref 98–111)
Creatinine, Ser: 0.79 mg/dL (ref 0.44–1.00)
GFR, Estimated: >60 mL/min (ref >60)
Glucose, Bld: 78 mg/dL (ref 70–99)
Potassium: 4.3 mmol/L (ref 3.5–5.1)
Sodium: 138 mmol/L (ref 135–145)

## 2020-10-19 LAB — PROTIME-INR
INR: 1.7 — ABNORMAL HIGH (ref 0.8–1.2)
Prothrombin Time: 20.1 seconds — ABNORMAL HIGH (ref 11.4–15.2)

## 2020-10-19 MED ORDER — DEXTROSE 5 % IV SOLN
1500.0000 mg | Freq: Once | INTRAVENOUS | Status: AC
  Administered 2020-10-19: 1500 mg via INTRAVENOUS
  Filled 2020-10-19: qty 75

## 2020-10-19 MED ORDER — VANCOMYCIN HCL 2000 MG/400ML IV SOLN
2000.0000 mg | Freq: Once | INTRAVENOUS | Status: DC
  Filled 2020-10-19: qty 400

## 2020-10-19 NOTE — ED Notes (Signed)
Pharmacy contacted for missing med

## 2020-10-19 NOTE — ED Triage Notes (Signed)
Pt to ED via POV with c/o wound on the back of her left leg. She reports that she was at Dr. Sherlean Foot office today and they sent her her to be evaluated because wound is getting bigger and she is on Bactrim and it has not helped. She has had the wound for about a week. Pt has MRSA. MDs office sent her here because she is on Coumadin and did not feel comfortable changing her antibiotics up.

## 2020-10-19 NOTE — Progress Notes (Addendum)
Follow-Up Visit   Subjective  Laurie Rice is a 60 y.o. female who presents for the following: Skin Problem (Open wound on the posterior leg for ~10 days, treating with Bactrim ds prescribed by her PCP ) and Rash (Rash on the body ~10 days, using Hiblclens wash to to clean skin which care taker think is making the skin dry and irritated ). Pt using Hydrocortisone 1% cream on her legs. Dr Lovie Macadamia is checking blood work for Warfarin regularly pt has an appt today    The following portions of the chart were reviewed this encounter and updated as appropriate:   Tobacco  Allergies  Meds  Problems  Med Hx  Surg Hx  Fam Hx      Review of Systems:  No other skin or systemic complaints except as noted in HPI or Assessment and Plan.  Objective  Well appearing patient in no apparent distress; mood and affect are within normal limits.  A focused examination was performed including face,arms,legs, abdomen,back . Relevant physical exam findings are noted in the Assessment and Plan.  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  Due to very minor involvement at previous visit and interaction of Bactrim DS with warfarin, started with chlorhexidine wash and mupirocin to nares BID and affected areas TID with instructions to call if she developed any new lesions or any worsening so we could start po therapy.   She reportedly developed a lesion at her thigh about 10 days ago which worsened. We received a call last Thursday afternoon and were advised she had a new area that was not improving and an appointment with PCP Friday morning. At  that point, we deferred starting Bactrim until PCP visit the next morning due to warfarin interaction so he could make adjustments to both and decide on dosing (single bactrim DS bid vs 2 bactrim DS BID).   Per PCP note, she had a 2-3 cm cavitary lesion at left thigh. She was started on bactrim DS 1 BID. Since that time, her caregiver notes the cavity has gotten larger. Today it measures 3.8 x 2.1 cm.   Given significant worsening over weekend despite bactrim, recommend she goes to ED for anticipated admission for IV antibiotics. Caregiver will take her. Of note, she will miss her warfarin check-up today due to ED visit.   Pyoderma gangrenosum is a consideration given worsening ulceration rather rapidly despite antibiotics, but it usually is not this deep and cavitary. With her known MRSA history I think this is less likely and would not biopsy and consider treating for this without ensuring adequate treatment of MRSA.   Discussed with patient's guardian, patient's cousin Laurie Rice at [phone], who is in agreement with plan.   I would like to see her in clinic 2-5 days after discharge from the hospital for close follow-up.  Other eczema Mid Back  C/w eczema. Unclear etiology, could be related to dryness from hibiclens or other soap.  Start triamcinolone 0.1% ointment twice a day as needed to affected areas up to 2 weeks. Avoid applying to face, groin, and axilla. Use as directed. Long-term use can cause thinning  of the skin.  Ok to stop hibiclens for now as she is getting systemic therapy for MRSA but will likely recommend restarting after admission due to recurrent MRSA infections, may decrease frequency to once per week.  No follow-ups on file.  I, Marye Round, CMA, am acting as scribe for Forest Gleason, MD .   Documentation: I have reviewed the above documentation for accuracy and completeness, and I agree with the above.  Forest Gleason, MD

## 2020-10-19 NOTE — ED Notes (Signed)
IV team at bedside 

## 2020-10-19 NOTE — ED Notes (Signed)
Pt assisted to restroom via w/c, brief changed

## 2020-10-19 NOTE — ED Notes (Signed)
Sandwich tray and beverage provided to pt.

## 2020-10-19 NOTE — Telephone Encounter (Signed)
Called patient's legal guardian, [name], to let him know Dr. Laurence Ferrari recommends patient's caregiver take patient directly to ED for evaluation and IV antibiotics. Mr. Berenice Primas asked that we give caregiver his number and have her call him upon arrival at the ED.

## 2020-10-19 NOTE — ED Provider Notes (Signed)
Emergency Medicine Provider Triage Evaluation Note  Laurie Rice, a 60 y.o. female  was evaluated in triage.  Pt complains of LLE wound. She presents with her caregiver from Dr. Sherlean Foot office. She has been followed for the last week for a LLE posterior thigh wound. She is on Bactrim DS Ii PO BID. The caregiver reports increased size of the lesion and the provider has referred her to the ED for further evaluation and suspected IV abx. She is known to be MRSA colonized.   Review of Systems  Positive: LLE wound  Negative: FCS  Physical Exam  BP 113/76 (BP Location: Left Arm)   Pulse (!) 57   Temp 97.8 F (36.6 C) (Oral)   Resp 20   Ht 5\' 6"  (1.676 m)   Wt 88.9 kg   SpO2 100%   BMI 31.63 kg/m  Gen:   Awake, no distress  NAD Resp:  Normal effort CTA MSK:   Moves extremities without difficulty LLE wound not assessed in triage. RLE with chronic edema and skin chanes Other:  CVS: RRR  Medical Decision Making  Medically screening exam initiated at 11:28 AM.  Appropriate orders placed.  Laurie Rice was informed that the remainder of the evaluation will be completed by another provider, this initial triage assessment does not replace that evaluation, and the importance of remaining in the ED until their evaluation is complete.  Patient with ED evaluation of LLE wound presumed to be oral abx failure.   Melvenia Needles, PA-C 10/19/20 1133    Naaman Plummer, MD 10/19/20 [phone]

## 2020-10-19 NOTE — ED Notes (Signed)
IV access attempt x2 by this RN.

## 2020-10-19 NOTE — ED Provider Notes (Signed)
[facility]  ____________________________________________   Event Date/Time   First MD Initiated Contact with Patient 10/19/20 1237     (approximate)  I have reviewed the triage vital signs and the nursing notes.   HISTORY  Chief Complaint Wound Infection    HPI Laurie Rice is a 60 y.o. 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  Patient's caregiver is at bedside provides majority of the history.  They note that about a week ago she developed a small lesion on her left posterior calf.  Patient was initially advised to use over-the-counter mupirocin by dermatology.  She saw her primary care provider on Friday and was prescribed Bactrim double strength, which she has been taking.  She then followed up with Dr. Saddie Benders office today and they were concerned that the lesion had significantly grown in size despite being on the Bactrim.  They referred her to the emergency department for likely admission for IV antibiotics.  Patient denies pain at the site.  She has not had fevers or chills, denies other complaints.          Past Medical History:  Diagnosis Date  . Anemia    chronic, mcv chronically low  . Cellulitis    History of  . 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 History of lymphocytosis   . Impetigo    History of  . Liver masses    History of, Benign  . Meningioma ([place])   . Mild mental retardation   . Monoclonal gammopathy    low level  . Personal history of pulmonary embolism   . Seizures ([place])   . Thyroid mass    history of    Patient Active Problem List   Diagnosis Date Noted  . 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 Cellulitis 07/10/2014  . DVT (deep venous thrombosis) ([place]) 07/09/2014  . Cellulitis of leg, right 07/09/2014  . Mild mental retardation  07/09/2014  . Seizures ([place]) 07/09/2014  . Subtherapeutic international normalized ratio (INR) 07/09/2014  . Meningioma ([place]) 07/07/2014  . Microcytic anemia 07/07/2014  . 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 NO PAST SURGERIES      Prior to Admission medications   Medication Sig Start Date End Date Taking? Authorizing Provider  bisoprolol-hydrochlorothiazide (ZIAC) 2.5-6.25 MG tablet Take 2 tablets by mouth daily. 09/04/18   [provider]  chlorhexidine (HIBICLENS) 4 % external liquid Wash from the neck down excluding the vagina every other day while showering. Do not get in the eyes. 09/16/20   Moye, [place], MD  Emollient (CETAPHIL) cream APPLY SMALL AMOUNT TO AFFECTED AREAS ONCE DAILY Patient not taking: Reported on 07/28/2020 04/07/14   [provider]  ferrous sulfate 325 (65 FE) MG EC tablet Take 1 tablet (325 mg total) by mouth daily with breakfast. With orange juice 12/09/14   Lequita Asal, MD  folic acid (FOLVITE) 1 MG tablet TAKE ONE TABLET BY MOUTH EACH DAY. 07/12/16   [provider]  mupirocin ointment (BACTROBAN) 2 % to open areas on the legs 1-3 times a day- apply to skin first- cover with a band aid. Apply until skin is healed . Patient not taking: Reported on 07/28/2020 05/13/20   Alfonso Patten, MD  mupirocin ointment Drue Stager)  2 % APPLY TO WOUNDS THREE TIMES A DAY UNTIL HEALED. 10/05/20   Moye, [place], MD  PHENobarbital (LUMINAL) 32.4 MG tablet Take 60 mg by mouth at bedtime.     [provider]  phenytoin (DILANTIN) 100 MG ER capsule TAKE 2 CAPSULES BY MOUTH AT BEDTIME (SEIZURE DISORDER) 11/19/13   [provider]  phenytoin (DILANTIN) 50 MG tablet Chew 50 mg by mouth at bedtime.    [provider]  triamcinolone ointment (KENALOG) 0.1 % Apply twice daily as needed to affected areas for two weeks then apply twice daily on weekends only. Avoid applying  to face, groin, and axilla. Use as directed. Risk of skin atrophy with long-term use reviewed. Patient not taking: No sig reported 05/13/20   Moye, [place], MD  warfarin (COUMADIN) 1 MG tablet TAKE 2 TABLETS (2MG ) BY MOUTH ONCE DAILY (ALONG W/ 5MG =7MG  TOTAL DOSAGE). 02/11/14   [provider]  warfarin (COUMADIN) 5 MG tablet TAKE 1 TABLET BY MOUTH EVERY DAY (ALONG W/ 2MG =7MG  TOTAL DOSAGE). 02/11/14   [provider]    Allergies Patient has no known allergies.  Family History  Problem Relation Age of Onset  . Hypertension Other   . Arthritis Other   . Breast cancer Neg Hx     Social History Social History   Tobacco Use  . Smoking status: Never  . Smokeless tobacco: Never  Substance Use Topics  . Alcohol use: No  . Drug use: No    Review of Systems   Review of Systems  Constitutional:  Negative for chills and fever.  Skin:  Positive for rash and wound.  All other systems reviewed and are negative.  Physical Exam Updated Vital Signs BP 127/64   Pulse 70   Temp 97.8 F (36.6 C) (Oral)   Resp 20   Ht 5\' 6"  (1.676 m)   Wt 88.9 kg   SpO2 100%   BMI 31.63 kg/m   Physical Exam Vitals and nursing note reviewed.  Constitutional:      General: She is not in acute distress.    Appearance: Normal appearance.  HENT:     Head: Normocephalic and atraumatic.  Eyes:     General: No scleral icterus.    Conjunctiva/sclera: Conjunctivae normal.  Pulmonary:     Effort: Pulmonary effort is normal. No respiratory distress.     Breath sounds: No stridor.  Musculoskeletal:        General: No deformity or signs of injury.     Cervical back: Normal range of motion.  Skin:    General: Skin is dry.     Coloration: Skin is not jaundiced or pale.     Comments: Punched out lesion about 2 x 3 cm in the left posterior calf with some surrounding erythema and warmth, there is no purulence or drainage no tenderness, no crepitus  Neurological:     General: No focal deficit  present.     Mental Status: She is alert and oriented to person, place, and time. Mental status is at baseline.  Psychiatric:        Mood and Affect: Mood normal.        Behavior: Behavior normal.     LABS (all labs ordered are listed, but only abnormal results are displayed)  Labs Reviewed  CBC WITH DIFFERENTIAL/PLATELET - Abnormal; Notable for the following components:      Result Value   RBC 5.12 (*)    Hemoglobin 11.4 (*)    MCV 71.9 (*)  MCH 22.3 (*)    RDW 15.9 (*)    All other components within normal limits  PROTIME-INR - Abnormal; Notable for the following components:   Prothrombin Time 20.1 (*)    INR 1.7 (*)    All other components within normal limits  BASIC METABOLIC PANEL   ____________________________________________  EKG  N/a ____________________________________________  RADIOLOGY Almeta Monas, personally viewed and evaluated these images (plain radiographs) as part of my medical decision making, as well as reviewing the written report by the radiologist.  ED MD interpretation:  n/a    ____________________________________________   PROCEDURES  Procedure(s) performed (including Critical Care):  Procedures   ____________________________________________   INITIAL IMPRESSION / ASSESSMENT AND PLAN / ED COURSE     Patient presents sent from dermatology clinic due to concern for worsening infection while on Bactrim.  Apparently patient has a history of prior MRSA infection which was relatively well controlled until recently she developed a lesion on the posterior calf which is now progressed over the last week.  Was initially using mupirocin and started on Bactrim on Friday which she has been taking.  Now followed up with dermatology who notes that the lesion appears large and is concerned for progression and sent her to the ED.  Patient has no systemic symptoms.  Overall appears well.  She has no leukocytosis.  Patient is somewhat unusual  appearing and that it is punched out with some minimal cellulitis surrounding.  Somewhat unclear to me whether this is truly just infection.  Given the progression of what ever it is, while on Bactrim will admit for IV antibiotics.  Spoke with hospitalist Dr. Francine Graven, who asks if the pt would be appropriate for IV dalbavancin- if her only need for admission is IV abx. Pt has no SIRS criteria and looks well, so I do think that would be appropriate. Pharmacist reviewed and pt meets criteria for abx. Will administer in the ED. This is a one time medication. Will dc with continued wound care and PCP f/u.       ____________________________________________   FINAL CLINICAL IMPRESSION(S) / ED DIAGNOSES  Final diagnoses:  None     ED Discharge Orders     None        Note:  This document was prepared using Dragon voice recognition software and may include unintentional dictation errors.    Rada Hay, MD 10/19/20 [phone]

## 2020-10-19 NOTE — Progress Notes (Signed)
Called by ED physician to admit this patient who is a 60 year old female referred to the emergency room from the dermatologist office for an infected left lower extremity posterior thigh wound.  Patient has a history of MRSA colonization and has been on Bactrim double strength twice daily without any significant improvement in the wound.  She was referred to the ER for further evaluation. Patient is afebrile and has normal vital signs.  Her labs are within normal limits without leukocytosis. I have discussed with the ER physician, Dr Starleen Blue and we have agreed that the patient will benefit from receiving an IV dose of dalbavancin in the ER and will be discharged home to continue local wound care.

## 2020-10-19 NOTE — Patient Instructions (Signed)

## 2020-10-19 NOTE — Discharge Instructions (Addendum)
We gave you a dose of long-acting antibiotic to treat your infection. Because the antibiotic is long lasting, you only need one dose of it. Please conitnue routine wound care and follow-up both with your primary doctor as well as Dermatology.

## 2020-10-21 NOTE — Progress Notes (Signed)
error 

## 2020-10-24 LAB — CULTURE, BLOOD (ROUTINE X 2)
Culture: NO GROWTH
Culture: NO GROWTH

## 2020-10-27 ENCOUNTER — Ambulatory Visit: Payer: Medicare Other | Admitting: Dermatology

## 2020-10-28 ENCOUNTER — Ambulatory Visit (INDEPENDENT_AMBULATORY_CARE_PROVIDER_SITE_OTHER): Payer: Medicare Other | Admitting: Dermatology

## 2020-10-28 ENCOUNTER — Encounter: Payer: Self-pay | Admitting: Dermatology

## 2020-10-28 ENCOUNTER — Telehealth: Payer: Self-pay

## 2020-10-28 ENCOUNTER — Other Ambulatory Visit: Payer: Self-pay

## 2020-10-28 DIAGNOSIS — R21 Rash and other nonspecific skin eruption: Secondary | ICD-10-CM

## 2020-10-28 DIAGNOSIS — L98491 Non-pressure chronic ulcer of skin of other sites limited to breakdown of skin: Secondary | ICD-10-CM | POA: Diagnosis not present

## 2020-10-28 DIAGNOSIS — I872 Venous insufficiency (chronic) (peripheral): Secondary | ICD-10-CM

## 2020-10-28 MED ORDER — CLOBETASOL PROPIONATE 0.05 % EX SOLN: CUTANEOUS | 0 refills | Status: DC

## 2020-10-28 MED ORDER — MUPIROCIN 2 % EX OINT: TOPICAL_OINTMENT | CUTANEOUS | 2 refills | Status: DC

## 2020-10-28 MED ORDER — TRIAMCINOLONE ACETONIDE 0.1 % EX OINT: TOPICAL_OINTMENT | CUTANEOUS | 0 refills | Status: DC

## 2020-10-28 NOTE — Telephone Encounter (Signed)
Insurance denied clobetasol solution. Pt must try and fail betamethasone dipropionate lotion. Betamethasone augmented lotion, or mometasone topical solution

## 2020-10-28 NOTE — Telephone Encounter (Signed)
Please change to triamcinolone 0.1% ointment 1 lb jar. Apply twice a day to rash on body avoiding face, groin and underarms. She should not mix this with Cerave - this should be applied directly to her skin.   Please call facility with update and also fax them written instructions as per above.

## 2020-10-28 NOTE — Telephone Encounter (Signed)
Spoke with caretaker about change in medication. Faxed over new order to caregiver with new instructions.   Faxed to [phone]

## 2020-10-28 NOTE — Progress Notes (Signed)
Follow-Up Visit   Subjective  Laurie Rice is a 60 y.o. female who presents for the following: Skin Problem (Patient here today for rash all over her entire body that has continued to get worse. Rash came up yesterday, not itchy for patient. Patient had IV treatment for MRSA at the hospital 9 days ago. ).  Patient accompanied by caregiver who provides history.   The following portions of the chart were reviewed this encounter and updated as appropriate:   Tobacco  Allergies  Meds  Problems  Med Hx  Surg Hx  Fam Hx      Review of Systems:  No other skin or systemic complaints except as noted in HPI or Assessment and Plan.  Objective  Well appearing patient in no apparent distress; mood and affect are within normal limits.  A focused examination was performed including arms, legs, trunk. Relevant physical exam findings are noted in the Assessment and Plan.  trunk, arms, legs Widespread scaly pink papules coalescing to plaques at trunk, arms, legs   Left Posterior Thigh 3.4 x 1.7 cm, improved   Assessment & Plan  Rash trunk, arms, legs  Exam c/w drug rash, likely secondary to dalbavancin as she has not had any other new medications lately. No evidence of infection.   Discussed with caregiver as well as guardian. Reviewed this is expected to gradually improve over the next few weeks. Discussed options including doing nothing as this does not seem to be symptomatic, treating with a topical steroid to help with itch and possibly speed resolution, or treating with PO steroid to clear rash faster but at higher risk of side effects. Recommend one of the first two options. Guardian is in agreement and prefers to treat with a topical steroid. Advised the rash will take some time to clear, but they should call if she has any significant worsening or other symptoms.  Discontinue HC 2.5% cream.  Eczema Skin Care  Buy TWO 16oz jars of CeraVe moisturizing cream  [facility], [facility] (no prescription needed)  Costs about $15 per jar   Jar #1: Use as a moisturizer as needed. Can be applied to any area of the body. Use twice daily to unaffected areas. Jar #2: Pour one 1ml bottle of clobetasol 0.05% solution into jar, mix well. Label this jar to indicate the medication has been added. Use twice daily to affected areas. Do not apply to face, groin or underarms.  Moisturizer may burn or sting initially. Try for at least 4 weeks.   Topical steroids (such as triamcinolone, fluocinolone, fluocinonide, mometasone, clobetasol, halobetasol, betamethasone, hydrocortisone) can cause thinning and lightening of the skin if they are used for too long in the same area. Your physician has selected the right strength medicine for your problem and area affected on the body. Please use your medication only as directed by your physician to prevent side effects.     clobetasol (TEMOVATE) 0.05 % external solution - trunk, arms, legs Patient to mix in jar of CeraVe and apply twice daily to affected areas of rash. Avoid applying to face, groin, and axilla. Use as directed. Long-term use can cause thinning of the skin.  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 Patient's caregiver advised mupirocin must be done for 10 days continually. May discontinue Hibaclens.    mupirocin  ointment (BACTROBAN) 2 % - Left Posterior Thigh Apply to nares twice daily for 10 days continually. Apply to wound at left posterior thigh once daily and cover.  Venous stasis dermatitis of right lower extremity Right Lower Leg  Will address at follow up in 2 weeks.  Check pedal pulses at follow up to confirm if patient can use compression  Start the clobetasol in cerave BID to this area as well. D/c hydrocortisone due to  low potency.  Rash and other nonspecific skin eruption  Related Medications chlorhexidine (HIBICLENS) 4 % external liquid Wash from the neck down excluding the vagina every other day while showering. Do not get in the eyes.  mupirocin ointment (BACTROBAN) 2 % APPLY TO WOUNDS THREE TIMES A DAY UNTIL HEALED.  Return for as scheduled.  Graciella Belton, RMA, am acting as scribe for Forest Gleason, MD .  Documentation: I have reviewed the above documentation for accuracy and completeness, and I agree with the above.  Forest Gleason, MD

## 2020-10-28 NOTE — Patient Instructions (Addendum)
Continue mupirocin twice daily continuously to nares x 10 days. Continue mupirocin to ulcer daily and cover.  May discontinue Hibaclens.  Discontinue hydrocortisone.  Wash ulcer daily with soap and water, spray with over the counter hypochlorous spray from [facility], apply mupirocin and cover.  Eczema Skin Care  Buy TWO 16oz jars of CeraVe moisturizing cream  [facility], [facility], [facility] (no prescription needed)  Costs about $15 per jar   Jar #1: Use as a moisturizer as needed. Can be applied to any area of the body. Use twice daily to unaffected areas. Jar #2: Pour one 83ml bottle of clobetasol 0.05% solution into jar, mix well. Label this jar to indicate the medication has been added. Use twice daily to affected areas. Do not apply to face, groin or underarms.  Moisturizer may burn or sting initially. Try for at least 4 weeks.    Topical steroids (such as triamcinolone, fluocinolone, fluocinonide, mometasone, clobetasol, halobetasol, betamethasone, hydrocortisone) can cause thinning and lightening of the skin if they are used for too long in the same area. Your physician has selected the right strength medicine for your problem and area affected on the body. Please use your medication only as directed by your physician to prevent side effects.    If you have any questions or concerns for your doctor, please call our main line at [phone] and press option 4 to reach your doctor's medical assistant. If no one answers, please leave a voicemail as directed and we will return your call as soon as possible. Messages left after 4 pm will be answered the following business day.   You may also send [name] a message via [place]. We typically respond to MyChart messages within 1-2 business days.  For prescription refills, please ask your pharmacy to contact our office. Our fax number is [phone].  If you have an urgent issue when the clinic is closed that cannot wait until the next business day, you  can page your doctor at the number below.    Please note that while we do our best to be available for urgent issues outside of office hours, we are not available 24/7.   If you have an urgent issue and are unable to reach [name], you may choose to seek medical care at your doctor's office, retail clinic, urgent care center, or emergency room.  If you have a medical emergency, please immediately call 911 or go to the emergency department.  Pager Numbers  - Dr. Nehemiah Massed: [phone]  - Dr. Laurence Ferrari: [phone]  - Dr. Nicole Kindred: [phone]  In the event of inclement weather, please call our main line at [phone] for an update on the status of any delays or closures.  Dermatology Medication Tips: Please keep the boxes that topical medications come in in order to help keep track of the instructions about where and how to use these. Pharmacies typically print the medication instructions only on the boxes and not directly on the medication tubes.   If your medication is too expensive, please contact our office at [phone] option 4 or send [name] a message through [place].   We are unable to tell what your co-pay for medications will be in advance as this is different depending on your insurance coverage. However, we may be able to find a substitute medication at lower cost or fill out paperwork to get insurance to cover a needed medication.   If a prior authorization is required to get your medication covered by your insurance company, please allow [name]  1-2 business days to complete this process.  Drug prices often vary depending on where the prescription is filled and some pharmacies may offer cheaper prices.  The website www.goodrx.com contains coupons for medications through different pharmacies. The prices here do not account for what the cost may be with help from insurance (it may be cheaper with your insurance), but the website can give you the price if you did not use any insurance.  -  You can print the associated coupon and take it with your prescription to the pharmacy.  - You may also stop by our office during regular business hours and pick up a GoodRx coupon card.  - If you need your prescription sent electronically to a different pharmacy, notify our office through [facility] or by phone at [phone] option 4.

## 2020-10-28 NOTE — Addendum Note (Signed)
Addended by: Harriett Sine on: 10/28/2020 05:43 PM   Modules accepted: Orders

## 2020-10-28 NOTE — Telephone Encounter (Signed)
Called patient's caregiver, Jobe Marker and reviewed instructions from patient's visit today. Caregiver verbalizes instructions are clear and she will call with any questions.Cherly Hensen

## 2020-11-11 ENCOUNTER — Encounter: Payer: Self-pay | Admitting: Dermatology

## 2020-11-11 ENCOUNTER — Ambulatory Visit (INDEPENDENT_AMBULATORY_CARE_PROVIDER_SITE_OTHER): Payer: Medicare Other | Admitting: Dermatology

## 2020-11-11 ENCOUNTER — Telehealth: Payer: Self-pay

## 2020-11-11 ENCOUNTER — Other Ambulatory Visit: Payer: Self-pay

## 2020-11-11 DIAGNOSIS — L97122 Non-pressure chronic ulcer of left thigh with fat layer exposed: Secondary | ICD-10-CM

## 2020-11-11 DIAGNOSIS — Z8614 Personal history of Methicillin resistant Staphylococcus aureus infection: Secondary | ICD-10-CM

## 2020-11-11 DIAGNOSIS — R21 Rash and other nonspecific skin eruption: Secondary | ICD-10-CM | POA: Diagnosis not present

## 2020-11-11 DIAGNOSIS — L98492 Non-pressure chronic ulcer of skin of other sites with fat layer exposed: Secondary | ICD-10-CM

## 2020-11-11 DIAGNOSIS — L309 Dermatitis, unspecified: Secondary | ICD-10-CM | POA: Diagnosis not present

## 2020-11-11 MED ORDER — MUPIROCIN 2 % EX OINT: TOPICAL_OINTMENT | CUTANEOUS | 3 refills | Status: DC

## 2020-11-11 MED ORDER — CHLORHEXIDINE GLUCONATE 4 % EX LIQD: CUTANEOUS | 2 refills | Status: DC

## 2020-11-11 NOTE — Patient Instructions (Addendum)
Topical steroids (such as triamcinolone, fluocinolone, fluocinonide, mometasone, clobetasol, halobetasol, betamethasone, hydrocortisone) can cause thinning and lightening of the skin if they are used for too long in the same area. Your physician has selected the right strength medicine for your problem and area affected on the body. Please use your medication only as directed by your physician to prevent side effects.   Recommend restarting hibiclens wash daily from the neck down (do not get in vagina, eyes, or ears) for two weeks. If not getting any new skin lesions at that point, ok to decrease wash to once per week. Ok to follow with moisturizer to damp skin to help with dryness.  If you have any questions or concerns for your doctor, please call our main line at [phone] and press option 4 to reach your doctor's medical assistant. If no one answers, please leave a voicemail as directed and we will return your call as soon as possible. Messages left after 4 pm will be answered the following business day.   You may also send [name] a message via [place]. We typically respond to MyChart messages within 1-2 business days.  For prescription refills, please ask your pharmacy to contact our office. Our fax number is [phone].  If you have an urgent issue when the clinic is closed that cannot wait until the next business day, you can page your doctor at the number below.    Please note that while we do our best to be available for urgent issues outside of office hours, we are not available 24/7.   If you have an urgent issue and are unable to reach [name], you may choose to seek medical care at your doctor's office, retail clinic, urgent care center, or emergency room.  If you have a medical emergency, please immediately call 911 or go to the emergency department.  Pager Numbers  - Dr. Nehemiah Massed: [phone]  - Dr. Laurence Ferrari: [phone]  - Dr. Nicole Kindred: [phone]  In the event of inclement  weather, please call our main line at [phone] for an update on the status of any delays or closures.  Dermatology Medication Tips: Please keep the boxes that topical medications come in in order to help keep track of the instructions about where and how to use these. Pharmacies typically print the medication instructions only on the boxes and not directly on the medication tubes.   If your medication is too expensive, please contact our office at [phone] option 4 or send [name] a message through [place].   We are unable to tell what your co-pay for medications will be in advance as this is different depending on your insurance coverage. However, we may be able to find a substitute medication at lower cost or fill out paperwork to get insurance to cover a needed medication.   If a prior authorization is required to get your medication covered by your insurance company, please allow [name] 1-2 business days to complete this process.  Drug prices often vary depending on where the prescription is filled and some pharmacies may offer cheaper prices.  The website www.goodrx.com contains coupons for medications through different pharmacies. The prices here do not account for what the cost may be with help from insurance (it may be cheaper with your insurance), but the website can give you the price if you did not use any insurance.  - You can print the associated coupon and take it with your prescription to the pharmacy.  - You may also stop by our  office during regular business hours and pick up a GoodRx coupon card.  - If you need your prescription sent electronically to a different pharmacy, notify our office through [facility] or by phone at [phone] option 4.

## 2020-11-11 NOTE — Progress Notes (Signed)
Caregiver requested discontinuing all previous orders and instructions for Mupirocin since they are different on each prescription and because of this difficult for the pharmacy to fill. Patient should continue applying Mupirocin 2% ointment once daily to the ulceration on the L post thigh, packing with gauze, and covering daily until healed. Should new ulcerations or wounds occur she should apply to aa's TID until healed.

## 2020-11-11 NOTE — Progress Notes (Signed)
Follow-Up Visit   Subjective  Laurie Rice is a 60 y.o. female who presents for the following: wound (On the R breast - painful, patient would like checked today. ) and Hx MRSA infection (On the L post thigh - patient states that wound is no longer bothersome ).   The following portions of the chart were reviewed this encounter and updated as appropriate:   Tobacco  Allergies  Meds  Problems  Med Hx  Surg Hx  Fam Hx      Review of Systems:  No other skin or systemic complaints except as noted in HPI or Assessment and Plan.  Objective  Well appearing patient in no apparent distress; mood and affect are within normal limits.  A focused examination was performed including the back, breast, and legs. Relevant physical exam findings are noted in the Assessment and Plan.  L post thigh 3.4 x 1.3 cm much more superficial than previous  Right Breast Hyperpigmented smooth plaque right breast  Mid Back Scaly pink plaques lower back.   Assessment & Plan  Rash Lower back  Drug rash, nearly resolved  Continue triamcinolone 0.1% ointment twice a day as needed up to 2 more weeks to any active areas of rash. Avoid applying to face, groin, and axilla. Use as directed. Long-term use can cause thinning of the skin.  Topical steroids (such as triamcinolone, fluocinolone, fluocinonide, mometasone, clobetasol, halobetasol, betamethasone, hydrocortisone) can cause thinning and lightening of the skin if they are used for too long in the same area. Your physician has selected the right strength medicine for your problem and area affected on the body. Please use your medication only as directed by your physician to prevent side effects.    Related Medications triamcinolone ointment (KENALOG) 0.1 % Apply twice a day to rash on body. Avoiding face, groin, and underarms. Do not mix with this with CeraVe cream.  Ulcer, skin, chronic, with fat layer exposed ([place]) L post  thigh  Secondary to MRSA infection Improving  Wash daily with soap and water or hibiclens and water  Continue mupirocin daily, pack ulcer with gauze and cover with bandage daily. Call for any worsening, new pain or other concerns.    History of MRSA infection Right Breast  No active areas today  See history in previous notes  Already treated nares with mupirocin twice a day x 10 days  Recommend restarting hibiclens wash daily from the neck down (do not get in vagina, eyes, or ears) for two weeks. If not getting any new skin lesions at that point, ok to decrease wash to once per week. Ok to follow with moisturizer to damp skin to help with dryness.   Call if continuing to develop new lesions. Low threshold for restarting PO therapy (likely Bactrim DS 2 twice a day) but would need to address warfarin dosing if we do this. Low threshold for ID referral to help with decolonization if continues to be a problem.  Dermatitis Mid Back  Eczema vs residual drug reaction  Continue triamcinolone 0.1% ointment twice a day as needed. Topical steroids (such as triamcinolone, fluocinolone, fluocinonide, mometasone, clobetasol, halobetasol, betamethasone, hydrocortisone) can cause thinning and lightening of the skin if they are used for too long in the same area. Your physician has selected the right strength medicine for your problem and area affected on the body. Please use your medication only as directed by your physician to prevent side effects.      Rash and other nonspecific  skin eruption  Related Medications mupirocin ointment (BACTROBAN) 2 % APPLY TO WOUNDS THREE TIMES A DAY UNTIL HEALED.  chlorhexidine (HIBICLENS) 4 % external liquid Wash from the neck down excluding the vagina as directed while showering. Do not get in the eyes or ears.  RTC 4 weeks  I, Rudell Cobb, CMA, am acting as scribe for Forest Gleason, MD .  Documentation: I have reviewed the above documentation for  accuracy and completeness, and I agree with the above.  Forest Gleason, MD

## 2020-11-11 NOTE — Telephone Encounter (Signed)
I spoke with patient's caregiver Mr. Berenice Primas and updated him on Jaquila's office visit today. I advised him that she would be restarting the Hibiclen's wash daily in the shower, and continuing wound care to the ulceration on her L post thigh. He verbalized understanding and has no questions at this time.

## 2020-12-09 ENCOUNTER — Other Ambulatory Visit: Payer: Self-pay | Admitting: Dermatology

## 2020-12-09 DIAGNOSIS — R21 Rash and other nonspecific skin eruption: Secondary | ICD-10-CM

## 2020-12-14 ENCOUNTER — Ambulatory Visit (INDEPENDENT_AMBULATORY_CARE_PROVIDER_SITE_OTHER): Payer: Medicare Other | Admitting: Dermatology

## 2020-12-14 ENCOUNTER — Encounter: Payer: Self-pay | Admitting: Dermatology

## 2020-12-14 ENCOUNTER — Other Ambulatory Visit: Payer: Self-pay

## 2020-12-14 DIAGNOSIS — L309 Dermatitis, unspecified: Secondary | ICD-10-CM | POA: Diagnosis not present

## 2020-12-14 DIAGNOSIS — Z8614 Personal history of Methicillin resistant Staphylococcus aureus infection: Secondary | ICD-10-CM | POA: Diagnosis not present

## 2020-12-14 DIAGNOSIS — L905 Scar conditions and fibrosis of skin: Secondary | ICD-10-CM | POA: Diagnosis not present

## 2020-12-14 MED ORDER — PREDNISONE 5 MG PO TABS: ORAL_TABLET | ORAL | 0 refills | Status: DC

## 2020-12-14 NOTE — Progress Notes (Signed)
   Follow-Up Visit   Subjective  Laurie Rice is a 60 y.o. female who presents for the following: Follow-up (Patient here today for 1 month rash follow up. Patient was using Hibaclens daily but has started breaking out and is dry. Patient's caregiver advises they are using TMC 0.1% ointment. Not itchy or bothersome for patient.).  Patient accompanied by caregiver.   The following portions of the chart were reviewed this encounter and updated as appropriate:   Tobacco  Allergies  Meds  Problems  Med Hx  Surg Hx  Fam Hx      Review of Systems:  No other skin or systemic complaints except as noted in HPI or Assessment and Plan.  Objective  Well appearing patient in no apparent distress; mood and affect are within normal limits.  A focused examination was performed including legs, abdomen, chest, buttocks, back. Relevant physical exam findings are noted in the Assessment and Plan.  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 No other new meds. Persistent.  D/c Hibiclens D/c TMC 0.1% ointment  Start 2 week prednisone taper.   Risks of prednisone taper discussed including mood irritability, insomnia, weight gain, stomach ulcers, increased risk of infection, increased blood sugar (diabetes), hypertension, osteoporosis with long-term or frequent use, and rare risk of avascular necrosis of the hip.   Spoke with patient's legal guardian, Mr. Berenice Primas and he is ok with patient starting prednisone taper.   Advised to call if worsening. Will recheck in 1 month  predniSONE (DELTASONE) 5 MG tablet - chest, abdomen Start 2 week taper as instructed.  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 S/p mupirocin to nares x 10 days and  hibiclens from the neck down or 3+ weeks. If recurs again, may consider ID consult.   Return in about 4 weeks (around 01/11/2021).  Graciella Belton, RMA, am acting as scribe for Forest Gleason, MD .  Documentation: I have reviewed the above documentation for accuracy and completeness, and I agree with the above.  Forest Gleason, MD

## 2020-12-14 NOTE — Patient Instructions (Addendum)
Risks of prednisone taper discussed including mood irritability, insomnia, weight gain, stomach ulcers, increased risk of infection, increased blood sugar (diabetes), hypertension, osteoporosis with long-term or frequent use, and rare risk of avascular necrosis of the hip.   2 Week Prednisone Taper  You will be given a prescription for 100 tablets of oral Prednisone. It is very important that you take this according to the exact schedule provided below. This type of regimen for taking medication is often called a "taper", because your dosage will steadily decrease over a two week period until it is discontinued altogether.  ALWAYS take this medicine with food to prevent it from irritating your stomach. You should also take your Prednisone during morning hours.  Call the clinic at [phone] if you gain more than two pounds in one day, notice swelling anywhere on your body, have shortness of breath, black or red bowel movements, brown or red vomitus, desire to drink large amounts of fluids, a fever, or extreme weakness.   Oral Prednisone over Two Weeks  Day  Week 1  Week 2   1  12  tablets  7 tablets   2  12 tablets  6 tablets   3  11 tablets  5 tablets   4  10 tablets  4 tablets   5  10 tablets  3 tablets   6  9 tablets  2 tablets   7  8 tablets  1 tablet    If You Need Anything After Your Visit  If you have any questions or concerns for your doctor, please call our main line at [phone] and press option 4 to reach your doctor's medical assistant. If no one answers, please leave a voicemail as directed and we will return your call as soon as possible. Messages left after 4 pm will be answered the following business day.   You may also send [name] a message via [place]. We typically respond to MyChart messages within 1-2 business days.  For prescription refills, please ask your pharmacy to contact our office. Our fax number is [phone].  If you have an urgent issue when the clinic  is closed that cannot wait until the next business day, you can page your doctor at the number below.    Please note that while we do our best to be available for urgent issues outside of office hours, we are not available 24/7.   If you have an urgent issue and are unable to reach [name], you may choose to seek medical care at your doctor's office, retail clinic, urgent care center, or emergency room.  If you have a medical emergency, please immediately call 911 or go to the emergency department.  Pager Numbers  - Dr. Nehemiah Massed: [phone]  - Dr. Laurence Ferrari: [phone]  - Dr. Nicole Kindred: [phone]  In the event of inclement weather, please call our main line at [phone] for an update on the status of any delays or closures.  Dermatology Medication Tips: Please keep the boxes that topical medications come in in order to help keep track of the instructions about where and how to use these. Pharmacies typically print the medication instructions only on the boxes and not directly on the medication tubes.   If your medication is too expensive, please contact our office at [phone] option 4 or send [name] a message through [place].   We are unable to tell what your co-pay for medications will be in advance as this is different depending on your insurance coverage. However, we  may be able to find a substitute medication at lower cost or fill out paperwork to get insurance to cover a needed medication.   If a prior authorization is required to get your medication covered by your insurance company, please allow [name] 1-2 business days to complete this process.  Drug prices often vary depending on where the prescription is filled and some pharmacies may offer cheaper prices.  The website www.goodrx.com contains coupons for medications through different pharmacies. The prices here do not account for what the cost may be with help from insurance (it may be cheaper with your insurance), but the website  can give you the price if you did not use any insurance.  - You can print the associated coupon and take it with your prescription to the pharmacy.  - You may also stop by our office during regular business hours and pick up a GoodRx coupon card.  - If you need your prescription sent electronically to a different pharmacy, notify our office through [facility] or by phone at [phone] option 4.     Si Usted Necesita Algo Despus de Su Visita  Tambin puede enviarnos un mensaje a travs de Pharmacist, community. Por lo general respondemos a los mensajes de MyChart en el transcurso de 1 a 2 das hbiles.  Para renovar recetas, por favor pida a su farmacia que se ponga en contacto con nuestra oficina. Harland Dingwall de fax es [place] [phone].  Si tiene un asunto urgente cuando la clnica est cerrada y que no puede esperar hasta el siguiente da hbil, puede llamar/localizar a su doctor(a) al nmero que aparece a continuacin.   Por favor, tenga en cuenta que aunque hacemos todo lo posible para estar disponibles para asuntos urgentes fuera del horario de [place], no estamos disponibles las 24 horas del da, los 7 das de la [place].   Si tiene un problema urgente y no puede comunicarse con nosotros, puede optar por buscar atencin mdica  en el consultorio de su doctor(a), en una clnica privada, en un centro de atencin urgente o en una sala de emergencias.  Si tiene Engineering geologist, por favor llame inmediatamente al 911 o vaya a la sala de emergencias.  Nmeros de bper  - Dr. Nehemiah Massed: [phone]  - Dra. Moye: [phone]  - Dra. Nicole Kindred: [phone]  En caso de inclemencias del [place], por favor llame a Johnsie Kindred principal al [phone] para una actualizacin sobre el [place] de cualquier retraso o cierre.  Consejos para la medicacin en dermatologa: Por favor, guarde las cajas en las que vienen los medicamentos de uso tpico para ayudarle a seguir las instrucciones  sobre dnde y cmo usarlos. Las farmacias generalmente imprimen las instrucciones del medicamento slo en las cajas y no directamente en los tubos del [place].   Si su medicamento es muy caro, por favor, pngase en contacto con Zigmund Daniel llamando al [phone] y presione la opcin 4 o envenos un mensaje a travs de Pharmacist, community.   No podemos decirle cul ser su copago por los medicamentos por adelantado ya que esto es diferente dependiendo de la cobertura de su seguro. Sin embargo, es posible que podamos encontrar un medicamento sustituto a Electrical engineer un formulario para que el seguro cubra el medicamento que se considera necesario.   Si se requiere una autorizacin previa para que su compaa de seguros [place] su medicamento, por favor permtanos de 1 a 2 das hbiles para completar este proceso.  Los precios de los [employer]  varan con frecuencia dependiendo del lugar de dnde se surte la receta y alguna farmacias pueden ofrecer precios ms baratos.  El sitio web www.goodrx.com tiene cupones para medicamentos de Airline pilot. Los precios aqu no tienen en cuenta lo que podra costar con la ayuda del seguro (puede ser ms barato con su seguro), pero el sitio web puede darle el precio si no utiliz Research scientist (physical sciences).  - Puede imprimir el cupn correspondiente y llevarlo con su receta a la farmacia.  - Tambin puede pasar por nuestra oficina durante el horario de atencin regular y Charity fundraiser una tarjeta de cupones de GoodRx.  - Si necesita que su receta se enve electrnicamente a una farmacia diferente, informe a nuestra oficina a travs de MyChart de [place] o por telfono llamando al [phone] y presione la opcin 4.

## 2020-12-20 ENCOUNTER — Ambulatory Visit: Payer: Medicare Other | Admitting: Dermatology

## 2021-01-07 ENCOUNTER — Inpatient Hospital Stay: Payer: Medicare Other | Attending: Oncology

## 2021-01-07 ENCOUNTER — Other Ambulatory Visit: Payer: Self-pay

## 2021-01-07 DIAGNOSIS — C8510 Unspecified B-cell lymphoma, unspecified site: Secondary | ICD-10-CM | POA: Diagnosis present

## 2021-01-07 DIAGNOSIS — D509 Iron deficiency anemia, unspecified: Secondary | ICD-10-CM | POA: Insufficient documentation

## 2021-01-07 DIAGNOSIS — M7989 Other specified soft tissue disorders: Secondary | ICD-10-CM | POA: Diagnosis not present

## 2021-01-07 LAB — COMPREHENSIVE METABOLIC PANEL
ALT: 14 U/L (ref 0–44)
AST: 18 U/L (ref 15–41)
Albumin: 3.5 g/dL (ref 3.5–5.0)
Alkaline Phosphatase: 92 U/L (ref 38–126)
Anion gap: 7 (ref 5–15)
BUN: 16 mg/dL (ref 8–23)
CO2: 26 mmol/L (ref 22–32)
Calcium: 8.4 mg/dL — ABNORMAL LOW (ref 8.9–10.3)
Chloride: 105 mmol/L (ref 98–111)
Creatinine, Ser: 0.75 mg/dL (ref 0.44–1.00)
GFR, Estimated: >60 mL/min (ref >60)
Glucose, Bld: 82 mg/dL (ref 70–99)
Potassium: 3.5 mmol/L (ref 3.5–5.1)
Sodium: 138 mmol/L (ref 135–145)
Total Bilirubin: 0.4 mg/dL (ref 0.3–1.2)
Total Protein: 7.2 g/dL (ref 6.5–8.1)

## 2021-01-07 LAB — CBC WITH DIFFERENTIAL/PLATELET
Abs Immature Granulocytes: 0.02 10*3/uL (ref 0.00–0.07)
Basophils Absolute: 0 10*3/uL (ref 0.0–0.1)
Basophils Relative: 0 %
Eosinophils Absolute: 0.1 10*3/uL (ref 0.0–0.5)
Eosinophils Relative: 1 %
HCT: 36.1 % (ref 36.0–46.0)
Hemoglobin: 10.8 g/dL — ABNORMAL LOW (ref 12.0–15.0)
Immature Granulocytes: 0 %
Lymphocytes Relative: 22 %
Lymphs Abs: 2.1 10*3/uL (ref 0.7–4.0)
MCH: 21.6 pg — ABNORMAL LOW (ref 26.0–34.0)
MCHC: 29.9 g/dL — ABNORMAL LOW (ref 30.0–36.0)
MCV: 72.1 fL — ABNORMAL LOW (ref 80.0–100.0)
Monocytes Absolute: 0.7 10*3/uL (ref 0.1–1.0)
Monocytes Relative: 7 %
Neutro Abs: 6.7 10*3/uL (ref 1.7–7.7)
Neutrophils Relative %: 70 %
Platelets: 200 10*3/uL (ref 150–400)
RBC: 5.01 MIL/uL (ref 3.87–5.11)
RDW: 17.1 % — ABNORMAL HIGH (ref 11.5–15.5)
WBC: 9.8 10*3/uL (ref 4.0–10.5)
nRBC: 0 % (ref 0.0–0.2)

## 2021-01-10 LAB — KAPPA/LAMBDA LIGHT CHAINS
Kappa free light chain: 9.5 mg/L (ref 3.3–19.4)
Kappa, lambda light chain ratio: 0.21 — ABNORMAL LOW (ref 0.26–1.65)
Lambda free light chains: 44.2 mg/L — ABNORMAL HIGH (ref 5.7–26.3)

## 2021-01-11 LAB — VISCOSITY, SERUM: Viscosity, Serum: 1.9 rel.saline (ref 1.4–2.1)

## 2021-01-12 ENCOUNTER — Inpatient Hospital Stay (HOSPITAL_BASED_OUTPATIENT_CLINIC_OR_DEPARTMENT_OTHER): Payer: Medicare Other | Admitting: Oncology

## 2021-01-12 ENCOUNTER — Other Ambulatory Visit: Payer: Self-pay

## 2021-01-12 ENCOUNTER — Encounter: Payer: Self-pay | Admitting: Oncology

## 2021-01-12 VITALS — BP 138/83 | HR 87 | Temp 99.0°F | Wt 198.5 lb

## 2021-01-12 DIAGNOSIS — D509 Iron deficiency anemia, unspecified: Secondary | ICD-10-CM

## 2021-01-12 DIAGNOSIS — C8510 Unspecified B-cell lymphoma, unspecified site: Secondary | ICD-10-CM

## 2021-01-12 DIAGNOSIS — M7989 Other specified soft tissue disorders: Secondary | ICD-10-CM | POA: Diagnosis not present

## 2021-01-12 NOTE — Progress Notes (Signed)
Hematology/Oncology Progress Note  [phone] [phone]   Patient Care Team: Juluis Pitch, MD as PCP - General (Family Medicine)  REFERRING PROVIDER: Juluis Pitch, MD  CHIEF COMPLAINTS/REASON FOR VISIT:  Follow-up for lymphoma  HISTORY OF PRESENTING ILLNESS:  Laurie Rice is a  61 y.o.  female with PMH listed below follows up for lymphoma 06/10/2017 CBC showed elevated white count of 38.5, hemoglobin 10.2, MCV 74.7, absolute lymphocyte 30.4, basophil 0.13, Previous lab records reviewed. Leukocytosis onset of chronic, duration is since at least 2014 Anemia is also chronic since at least 2016 No aggravating or elevated factors. Patient is a poor historian.  She has a seizure disorder and meningioma.  She is not medical competent and has legal guardian.  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    Associated symptoms or signs:  Denies weight loss, fever, chills,night sweats.  Recent history of weight loss about 10 pounds over the past 1 year.  Smoking history: Never smoking History of recent oral steroid use or steroid injection: Denies History of recent infection: Denies Autoimmune disease history.  Denies  Reviewed patient's past medical history, was previously seen by Dr. Mike Gip with last visit more than 3 years ago.  Today she wants to reestablish care at [facility] site. 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  Phenotype did not suggest a specific histologic type and was not typical for CLL/SLL, mantle cell lymphoma, hairy cell leukemia or follicular cell lymphoma.  BCR ABL testing was negative.  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  Chronic microcytic anemia since 2008.  She had hemoglobin electrophoresis reviewed normal hemoglobin F and A2 Patient has a history of bilateral DVT and pulmonary embolism  in 2010.  Has been on Coumadin chronically She developed right lower extremity DVT on 07/09/2014 secondary to subtherapeutic INR  01/30/2020- 02/20/2020 weekly rituximab treatment x 4  INTERVAL HISTORY Kate A Shadowens is a 61 y.o. female who has above history reviewed by me today presents for follow up visit for lymphoma. Patient was accompanied by caregiver today.   Patient has increasing right lower extremity edema, hyperpigmentation.  Patient has been following up by dermatologist with a work-up diagnosis of dermatitis.  Caregiver reports that area of dermatitis turns to be hyperpigmented area. Patient is on Coumadin, managed by primary care provider.   .Review of Systems  Unable to perform ROS: Other (Intellectual disability)  Constitutional:  Positive for unexpected weight change. Negative for fatigue.  Leg swelling.   MEDICAL HISTORY:  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   History of lymphocytosis    Impetigo    History of   Liver masses    History of, Benign   Meningioma (HCC)    Mild mental retardation    Monoclonal gammopathy    low level   Personal history of pulmonary embolism    Seizures (HCC)    Thyroid mass    history of    SURGICAL HISTORY: Past Surgical History:  Procedure Laterality Date   NO PAST SURGERIES      SOCIAL HISTORY: Social History   Socioeconomic History   Marital status: Single    Spouse name: Not on file   Number of  children: Not on file   Years of education: Not on file   Highest education level: Not on file  Occupational History   Not on file  Tobacco Use   Smoking status: Never   Smokeless tobacco: Never  Substance and Sexual Activity   Alcohol use: No   Drug use: No   Sexual activity: Not on file  Other Topics Concern   Not on file  Social History Narrative   Not on file   Social  Determinants of Health   Financial Resource Strain: Not on file  Food Insecurity: Not on file  Transportation Needs: Not on file  Physical Activity: Not on file  Stress: Not on file  Social Connections: Not on file  Intimate Partner Violence: Not on file    FAMILY HISTORY: Family History  Problem Relation Age of Onset   Hypertension Other    Arthritis Other    Breast cancer Neg Hx     ALLERGIES:  is allergic to dalbavancin.  MEDICATIONS:  Current Outpatient Medications  Medication Sig Dispense Refill   bisoprolol-hydrochlorothiazide (ZIAC) 2.5-6.25 MG tablet Take 2 tablets by mouth daily.     Emollient (CETAPHIL) cream      ferrous sulfate 325 (65 FE) MG EC tablet Take 1 tablet (325 mg total) by mouth daily with breakfast. With orange juice 30 tablet 3   folic acid (FOLVITE) 1 MG tablet TAKE ONE TABLET BY MOUTH EACH DAY.     PHENObarbital (LUMINAL) 60 MG tablet Take 60 mg by mouth at bedtime.     phenytoin (DILANTIN) 100 MG ER capsule TAKE 2 CAPSULES BY MOUTH AT BEDTIME (SEIZURE DISORDER)     phenytoin (DILANTIN) 50 MG tablet Chew 50 mg by mouth at bedtime.     sulfamethoxazole-trimethoprim (BACTRIM DS) 800-160 MG tablet Take 1 tablet by mouth 2 (two) times daily.     warfarin (COUMADIN) 1 MG tablet TAKE 2 TABLETS (2MG) BY MOUTH ONCE DAILY (ALONG W/ 5MG=7MG TOTAL DOSAGE).     warfarin (COUMADIN) 6 MG tablet Take 6 mg by mouth at bedtime.     chlorhexidine (HIBICLENS) 4 % external liquid Wash from the neck down excluding the vagina as directed while showering. Do not get in the eyes or ears. (Patient not taking: Reported on 01/12/2021) 120 mL 2   mupirocin ointment (BACTROBAN) 2 % Discontinue all previous orders for Mupirocin and apply to aa's as directed. (Patient not taking: Reported on 01/12/2021) 22 g 3   predniSONE (DELTASONE) 5 MG tablet Start 2 week taper as instructed. (Patient not taking: Reported on 01/12/2021) 100 tablet 0   triamcinolone ointment (KENALOG) 0.1 % APPLY  TWICE DAILY TO RASH ON BODY. AVOIDING FACE, GROIN, AND UNDERARMS. DO NOT MIX  THIS WITH CERAVE CREAM. (Patient not taking: Reported on 01/12/2021) 454 g 0   warfarin (COUMADIN) 5 MG tablet TAKE 1 TABLET BY MOUTH EVERY DAY (ALONG W/ 2MG=7MG TOTAL DOSAGE). (Patient not taking: Reported on 10/19/2020)     No current facility-administered medications for this visit.     PHYSICAL EXAMINATION: ECOG PERFORMANCE STATUS: 1 - Symptomatic but completely ambulatory Vitals:   01/12/21 1358  BP: 138/83  Pulse: 87  Temp: 99 F (37.2 C)   Filed Weights   01/12/21 1358  Weight: 198 lb 8 oz (90 kg)    Physical Exam Constitutional:      General: She is not in acute distress. HENT:     Head: Normocephalic and atraumatic.  Eyes:     General: No  scleral icterus. Cardiovascular:     Rate and Rhythm: Normal rate and regular rhythm.     Heart sounds: Normal heart sounds.  Pulmonary:     Effort: Pulmonary effort is normal. No respiratory distress.     Breath sounds: No wheezing.  Abdominal:     General: Bowel sounds are normal. There is no distension.     Palpations: Abdomen is soft.  Musculoskeletal:        General: Swelling present. No deformity. Normal range of motion.     Cervical back: Normal range of motion and neck supple.     Comments: Bilateral lower extremity swelling, right worse than left.  Skin:    General: Skin is warm and dry.     Findings: No erythema or rash.     Comments: Significant hyperpigmentation changes of right lower extremity.  Also multiple area of her back.  Neurological:     Mental Status: She is alert. Mental status is at baseline.  Psychiatric:        Mood and Affect: Mood normal.    CMP Latest Ref Rng & Units 01/07/2021  Glucose 70 - 99 mg/dL 82  BUN 8 - 23 mg/dL 16  Creatinine 0.44 - 1.00 mg/dL 0.75  Sodium 135 - 145 mmol/L 138  Potassium 3.5 - 5.1 mmol/L 3.5  Chloride 98 - 111 mmol/L 105  CO2 22 - 32 mmol/L 26  Calcium 8.9 - 10.3 mg/dL 8.4(L)  Total  Protein 6.5 - 8.1 g/dL 7.2  Total Bilirubin 0.3 - 1.2 mg/dL 0.4  Alkaline Phos 38 - 126 U/L 92  AST 15 - 41 U/L 18  ALT 0 - 44 U/L 14   CBC Latest Ref Rng & Units 01/07/2021  WBC 4.0 - 10.5 K/uL 9.8  Hemoglobin 12.0 - 15.0 g/dL 10.8(L)  Hematocrit 36.0 - 46.0 % 36.1  Platelets 150 - 400 K/uL 200     RADIOGRAPHIC STUDIES: I have personally reviewed the radiological images as listed and agreed with the findings in the report. No results found.  LABORATORY DATA:  I have reviewed the data as listed Lab Results  Component Value Date   WBC 9.8 01/07/2021   HGB 10.8 (L) 01/07/2021   HCT 36.1 01/07/2021   MCV 72.1 (L) 01/07/2021   PLT 200 01/07/2021   Recent Labs    03/26/20 0951 06/25/20 1127 10/19/20 1144 01/07/21 1016  NA 138 137 138 138  K 3.6 4.5 4.3 3.5  CL 103 100 103 105  CO2 '25 28 25 26  ' GLUCOSE 84 90 78 82  BUN '16 14 18 16  ' CREATININE 0.76 0.64 0.79 0.75  CALCIUM 8.8* 9.1 9.2 8.4*  GFRNONAA >60 >60 >60 >60  PROT 8.6* 8.1  --  7.2  ALBUMIN 3.1* 3.6  --  3.5  AST 19 18  --  18  ALT 13 14  --  14  ALKPHOS 73 100  --  92  BILITOT 0.3 0.4  --  0.4    Iron/TIBC/Ferritin/ %Sat    Component Value Date/Time   IRON 99 02/13/2020 0808   IRON 94 08/22/2012 1142   TIBC 196 (L) 02/13/2020 0808   TIBC 221 (L) 08/22/2012 1142   FERRITIN 112 02/13/2020 0808   FERRITIN 65 08/22/2012 1142   IRONPCTSAT 51 (H) 02/13/2020 0808   IRONPCTSAT 43 08/22/2012 1142        ASSESSMENT & PLAN:  1. Low grade B-cell lymphoma (HCC)   2. Leg swelling   3. Microcytic anemia    #  Low-grade non-Hodgkin B-cell lymphoma, MYD negative, t (4;6) IgM gammopathy,IgM level 3366, M protein is elevated at 2.1. Bone marrow biopsy reveals marked involvement of lymphoma.   S/p 4 rituximab treatments.  Labs reviewed and discussed with patient. Serum viscosity has decreased.  Lambda light chain level has decreased as well. Multiple myeloma panelIgM level and M protein levels are  pending.  #chronic microcytic anemia, hemoglobin slightly decreased.  Close to baseline.  Check alpha thalssemia PCR #Acute on chronic lower extremity,Skin hyperpigmentation, dermatitis changes.   Check ultrasound right lower extremity to rule out DVT. ?lymphedema or vein insufficiency.  I referred patient to see vascular surgeon.   Orders Placed This Encounter  Procedures   US Venous Img Lower Unilateral Right    Standing Status:   Future    Standing Expiration Date:   01/12/2022    Order Specific Question:   Reason for Exam (SYMPTOM  OR DIAGNOSIS REQUIRED)    Answer:   leg swelling    Order Specific Question:   Preferred imaging location?    Answer:   [facility]   CBC with Differential/Platelet    Standing Status:   Future    Standing Expiration Date:   01/12/2022   Comprehensive metabolic panel    Standing Status:   Future    Standing Expiration Date:   01/12/2022   Kappa/lambda light chains    Standing Status:   Future    Standing Expiration Date:   01/12/2022   Multiple Myeloma Panel (SPEP&IFE w/QIG)    Standing Status:   Future    Standing Expiration Date:   01/12/2022   Viscosity, serum    Standing Status:   Future    Standing Expiration Date:   01/12/2022   Ambulatory referral to Vascular Surgery    Referral Priority:   Routine    Referral Type:   Surgical    Referral Reason:   Specialty Services Required    Requested Specialty:   Vascular Surgery    Number of Visits Requested:   1    All questions were answered. The patient knows to call the clinic with any problems questions or concerns.  Return of visit: 6 months  Earlie Server, MD, PhD  01/12/2021

## 2021-01-14 ENCOUNTER — Ambulatory Visit: Payer: Medicare Other

## 2021-01-14 LAB — MULTIPLE MYELOMA PANEL, SERUM
Albumin SerPl Elph-Mcnc: 3.3 g/dL (ref 2.9–4.4)
Albumin/Glob SerPl: 1 (ref 0.7–1.7)
Alpha 1: 0.2 g/dL (ref 0.0–0.4)
Alpha2 Glob SerPl Elph-Mcnc: 0.7 g/dL (ref 0.4–1.0)
B-Globulin SerPl Elph-Mcnc: 0.6 g/dL — ABNORMAL LOW (ref 0.7–1.3)
Gamma Glob SerPl Elph-Mcnc: 1.8 g/dL (ref 0.4–1.8)
Globulin, Total: 3.4 g/dL (ref 2.2–3.9)
IgA: 64 mg/dL — ABNORMAL LOW (ref 87–352)
IgG (Immunoglobin G), Serum: 631 mg/dL (ref 586–1602)
IgM (Immunoglobulin M), Srm: 1913 mg/dL — ABNORMAL HIGH (ref 26–217)
M Protein SerPl Elph-Mcnc: 1.2 g/dL — ABNORMAL HIGH
Total Protein ELP: 6.7 g/dL (ref 6.0–8.5)

## 2021-01-15 ENCOUNTER — Other Ambulatory Visit: Payer: Self-pay

## 2021-01-15 ENCOUNTER — Emergency Department
Admission: EM | Admit: 2021-01-15 | Discharge: 2021-01-15 | Disposition: A | Discharge status: Home / Self Care | Payer: Medicare Other | Attending: Emergency Medicine | Admitting: Emergency Medicine

## 2021-01-15 DIAGNOSIS — L03116 Cellulitis of left lower limb: Secondary | ICD-10-CM | POA: Insufficient documentation

## 2021-01-15 DIAGNOSIS — L0291 Cutaneous abscess, unspecified: Secondary | ICD-10-CM

## 2021-01-15 DIAGNOSIS — L02416 Cutaneous abscess of left lower limb: Secondary | ICD-10-CM | POA: Diagnosis not present

## 2021-01-15 DIAGNOSIS — L03115 Cellulitis of right lower limb: Secondary | ICD-10-CM

## 2021-01-15 MED ORDER — LIDOCAINE-EPINEPHRINE (PF) 2 %-1:200000 IJ SOLN
20.0000 mL | Freq: Once | INTRAMUSCULAR | Status: AC
  Administered 2021-01-15: 20 mL

## 2021-01-15 MED ORDER — SULFAMETHOXAZOLE-TRIMETHOPRIM 800-160 MG PO TABS: 1.0000 | ORAL_TABLET | Freq: Two times a day (BID) | ORAL | 0 refills | Status: AC

## 2021-01-15 MED ORDER — SULFAMETHOXAZOLE-TRIMETHOPRIM 800-160 MG PO TABS
1.0000 | ORAL_TABLET | Freq: Once | ORAL | Status: AC
  Administered 2021-01-15: 1 via ORAL
  Filled 2021-01-15: qty 1

## 2021-01-15 NOTE — ED Notes (Signed)
Pt to ED with caregiver/staff from long term care where she lives. Pt has wound on R posterior thigh with purulent drainage, present for 2 days. Pt in NAD.

## 2021-01-15 NOTE — ED Notes (Signed)
Pt will be discharged with caregiver to [facility] in [place].

## 2021-01-15 NOTE — Discharge Instructions (Addendum)
The abscess on your leg was drained and packed today.  Please see a physician in 2 to 3 days for repeat evaluation of the wound.  If the area of redness is spreading despite being on the antibiotic, please return to the emergency department.  Please take the antibiotic twice a day for the next 7 days.

## 2021-01-15 NOTE — ED Notes (Signed)
Ordered lidocaine with epi not available in flex pyxis. Sent med message to change order to something comparable.

## 2021-01-15 NOTE — ED Provider Triage Note (Signed)
Emergency Medicine Provider Triage Evaluation Note  Laurie Rice, a 61 y.o. female  was evaluated in triage.  Pt complains of wound infection.  Patient presents with her caregiver, with concern over an area to the posterior right thigh just below the buttocks that is firm, warm, red, and draining.  Patient was apparently put on a topical ointment by primary provider, but denies any benefit.  Caregiver denies any fevers, chills, or sweats.  Review of Systems  Positive: Wound infection  Negative: FCS  Physical Exam  BP 117/61 (BP Location: Left Arm)    Pulse 67    Temp 98.5 F (36.9 C) (Oral)    Resp 18    Ht 5\' 7"  (1.702 m)    Wt 89.8 kg    SpO2 100%    BMI 31.01 kg/m  Gen:   Awake, no distress   Resp:  Normal effort CTA MSK:   Moves extremities without difficulty  SKIN:   Large area of erythema and induration surrounding a central open wound with purulent serosanguineous drainage.  Medical Decision Making  Medically screening exam initiated at 11:50 AM.  Appropriate orders placed.  Laurie Rice was informed that the remainder of the evaluation will be completed by another provider, this initial triage assessment does not replace that evaluation, and the importance of remaining in the ED until their evaluation is complete.  Patient ED evaluation of a posterior right thigh wound concerning for progressing cellulitis/abscess.   Melvenia Needles, PA-C 01/15/21 1153

## 2021-01-15 NOTE — ED Triage Notes (Signed)
Pt has an infection of the skin on the back of her R thigh about the size of a quarter- pt was given a cream for it by her PCP but it has not helped- pt has not had any oral antibiotics to help with it- it was first noticed on Tuesday and has been getting worse since then

## 2021-01-15 NOTE — ED Provider Notes (Signed)
[facility] Provider Note    Event Date/Time   First MD Initiated Contact with Patient 01/15/21 1233     (approximate)   History   Wound Infection   HPI  Laurie Rice is a 61 y.o. female with past medical history of chronic cellulitis, DVT on warfarin who presents with redness and swelling on the right posterior leg.  Patient is accompanied by her caregiver.  Last week she apparently had a small wound on the right posterior leg.  Was putting mupirocin on it.  Caregiver says that today the wound looked much larger.  Patient has not had any fevers or chills.  Of note she has had wounds in the same area several times before.  She sees dermatology for this.  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   History of lymphocytosis    Impetigo    History of   Liver masses    History of, Benign   Meningioma (HCC)    Mild mental retardation    Monoclonal gammopathy    low level   Personal history of pulmonary embolism    Seizures (HCC)    Thyroid mass    history of    Patient Active Problem List   Diagnosis Date Noted   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   Cellulitis 07/10/2014   DVT (deep venous thrombosis) (HCC) 07/09/2014   Cellulitis of leg, right 07/09/2014   Mild mental retardation 07/09/2014   Seizures ([place]) 07/09/2014   Subtherapeutic international normalized ratio (INR) 07/09/2014   Meningioma ([place]) 07/07/2014   Microcytic anemia 07/07/2014   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 --      Excl. in [place]? --     Most recent vital signs: Vitals:   01/15/21 1140 01/15/21 1404  BP: 117/61 116/71  Pulse: 67 66  Resp: 18 15  Temp: 98.5 F (36.9 C)   SpO2: 100% 100%     General: Awake, no distress.  CV:  Good peripheral perfusion.  Resp:  Normal effort.  Abd:  No distention.  Neuro:             Awake, Alert, Oriented x 3  Other:  Large area of erythema and induration in the right posterior thigh with a central area of purulence with spontaneous drainage, is no tracking up to the perineum, no crepitus   E  Results / Procedures / Treatments  Labs (all labs ordered are listed, but only abnormal results are displayed) Labs Reviewed - No data to display   EKG     RADIOLOGY Bedside ultrasound of the right posterior thigh performed by myself shows approximately 2 cm abscess with surrounding cobblestoning to suggest cellulitis   PROCEDURES:  Critical Care performed: No  ..Incision and Drainage  Date/Time: 01/15/2021 3:26 PM Performed by: Rada Hay, MD Authorized by: Rada Hay, MD   Consent:    Consent obtained:  Verbal   Alternatives discussed:  No treatment Universal protocol:    Patient identity confirmed:  Verbally with patient Location:    Type:  Abscess   Location:  Lower extremity   Lower extremity location:  Leg   Leg location:  R upper leg Pre-procedure details:    Skin preparation:  Antiseptic wash Sedation:    Sedation type:  None Anesthesia:    Anesthesia method:  Local infiltration   Local anesthetic:  Lidocaine 1% WITH epi Procedure type:    Complexity:  Complex Procedure details:    Ultrasound guidance: no      Needle aspiration: no     Incision types:  Single straight   Incision depth:  Dermal   Wound management:  Probed and deloculated   Drainage:  Purulent   Drainage amount:  Copious   Wound treatment:  Wound left open   Packing materials:  1/4 in iodoform gauze Post-procedure details:    Procedure completion:  Tolerated   MEDICATIONS ORDERED IN ED: Medications  lidocaine-EPINEPHrine (XYLOCAINE W/EPI) 2 %-1:200000 (PF) injection 20 mL (20 mLs Infiltration Given by Other 01/15/21 1357)  sulfamethoxazole-trimethoprim (BACTRIM DS) 800-160 MG per tablet 1 tablet (1 tablet Oral Given 01/15/21 1400)     IMPRESSION / MDM / [place] / ED COURSE  I reviewed the triage vital signs and the nursing notes.                              Differential diagnosis includes, but is not limited to, abscess, cellulitis  Patient is a 61 year old female with recurrent episodes of MRSA and cellulitis who presents with an abscess and cellulitis to the right posterior thigh.  Apparently this had started with a small area of fluctuance about a week ago and she was putting mupirocin on it but it has significantly worsened.  Independent history obtained from the patient's caregiver who notes that the wound was much larger today.  She is otherwise been herself without fevers or any systemic symptoms.  On exam there is a fairly large area of erythema and induration on the posterior thigh with a central area of purulence.  I performed a bedside ultrasound to assess the depth of the wound and it does have about a 2 cm pocket of pus.  There is surrounding cobblestoning.  Cellulitis does not extend to the perineum there is no crepitus.  I&D was performed, did probe for loculations.  There was copious pus drained wound was packed.  We will start her on Bactrim.  Discussed with the caregiver that she should have this wound reevaluated in 2 to 3 days to ensure that its not worsening.  Discussed return precautions for  increasing redness or any systemic symptoms.  Considered admission for IV antibiotics however patient otherwise well without systemic symptoms and because I was able to drain an abscess I suspect that this will improve significantly now that  the source is been controlled.     FINAL CLINICAL IMPRESSION(S) / ED DIAGNOSES   Final diagnoses:  Cellulitis of right lower extremity  Abscess     Rx / DC Orders   ED Discharge Orders          Ordered    sulfamethoxazole-trimethoprim (BACTRIM DS) 800-160 MG tablet  2 times daily        01/15/21 1331    sulfamethoxazole-trimethoprim (BACTRIM DS) 800-160 MG tablet  2 times daily        01/15/21 1331             Note:  This document was prepared using Dragon voice recognition software and may include unintentional dictation errors.   Rada Hay, MD 01/15/21 [phone]

## 2021-01-18 ENCOUNTER — Other Ambulatory Visit: Payer: Self-pay

## 2021-01-18 ENCOUNTER — Encounter: Payer: Self-pay | Admitting: Dermatology

## 2021-01-18 ENCOUNTER — Ambulatory Visit (INDEPENDENT_AMBULATORY_CARE_PROVIDER_SITE_OTHER): Payer: Medicare Other | Admitting: Dermatology

## 2021-01-18 DIAGNOSIS — A4902 Methicillin resistant Staphylococcus aureus infection, unspecified site: Secondary | ICD-10-CM | POA: Diagnosis not present

## 2021-01-18 DIAGNOSIS — L304 Erythema intertrigo: Secondary | ICD-10-CM

## 2021-01-18 DIAGNOSIS — L0291 Cutaneous abscess, unspecified: Secondary | ICD-10-CM

## 2021-01-18 DIAGNOSIS — L02415 Cutaneous abscess of right lower limb: Secondary | ICD-10-CM

## 2021-01-18 MED ORDER — KETOCONAZOLE 2 % EX CREA: TOPICAL_CREAM | CUTANEOUS | 2 refills | Status: AC

## 2021-01-18 MED ORDER — HYDROCORTISONE 2.5 % EX CREA: TOPICAL_CREAM | CUTANEOUS | 3 refills | Status: DC

## 2021-01-18 NOTE — Progress Notes (Signed)
° °  Follow-Up Visit   Subjective  Laurie Rice is a 61 y.o. female who presents for the following: Skin Problem (Patient c/o infection at right posterior thigh. They report spot appeared last Wednesday. She was taken to ER on Saturday. Lesion was drained and packed. She was started on Bactrim at that time).  Caretaker with patient.   The following portions of the chart were reviewed this encounter and updated as appropriate:  Tobacco   Allergies   Meds   Problems   Med Hx   Surg Hx   Fam Hx       Review of Systems: No other skin or systemic complaints except as noted in HPI or Assessment and Plan.   Objective  Well appearing patient in no apparent distress; mood and affect are within normal limits.  A focused examination was performed including face, legs, low abdomen. Relevant physical exam findings are noted in the Assessment and Plan.  Right Thigh - Posterior subcutaneous nodule with significant purulent drainage with central packing  abdominal fold Erythematous patches   Assessment & Plan  Abscess Right Thigh - Posterior  Continue Bactrim as directed by ED.   Permission for procedure obtained from patient's cousin: [name]. Area prepped with hibiclens, numbed with lido/epi. Packing removed, C&S performed. Abscess drained. New 1/2" iodoform packing applied into lesion.   Wash from neck down with hibiclens, follow with moisturizer if needed for dryness resume Mupirocin inside nose twice a day for 10 days as directed.   Will send referral to ID clinic due to recurrent MRSA infections  Patient on Bactrim DS from ED. Recommend confirming with PCP that no adjustment to warfarin dosing required.  Also stressed again to please call us the same day if she has a new lesion appear as these can progress quickly. While we may not be able to get her in for a few days if the office is closed, if the clinic is open, we would do our best to add her onto the schedule to  prevent worsening of the site or need for ED visits.  Related Procedures Anaerobic and Aerobic Culture  Intertrigo abdominal fold  Intertrigo is a chronic recurrent rash that occurs in skin fold areas that may be associated with friction; heat; moisture; yeast; fungus; and bacteria.  It is exacerbated by increased movement / activity; sweating; and higher atmospheric temperature.  Chronic condition with duration or expected duration over one year. Condition is bothersome to patient. Currently flared.  apply ketoconazole cream followed by hydrocortisone 2.5% cream twice a day for up to 2 weeks    hydrocortisone 2.5 % cream - abdominal fold Apply twice daily to abdominal area for two weeks. Apply 2nd  ketoconazole (NIZORAL) 2 % cream - abdominal fold Apply twice daily to abdominal area for 2 weeks. Apply 1st  MRSA infection Right Thigh - Posterior  Recurrent and chronic for over one year. Patient has failed hibiclens wash and mupirocin to nares for decolonization  Chronic condition with duration or expected duration over one year. Currently flared.  Will refer to ID for further recs.      Return for recheck abscess in 1 week.  I, Emelia Salisbury, CMA, am acting as scribe for Forest Gleason, MD.  Documentation: I have reviewed the above documentation for accuracy and completeness, and I agree with the above.  Forest Gleason, MD

## 2021-01-18 NOTE — Patient Instructions (Addendum)
Continue bactrim as prescribed in the ED  For wound on right leg, cleanse with soap and water and then apply mupirocin and a bandage once a day  For itchy areas on the body, apply triamcinolone 0.1% ointment twice a day for up to 2 weeks. For scabbed areas, apply mupirocin twice a day.   For intertrigo rash at lower abdomen, apply ketoconazole cream followed by hydrocortisone 2.5% cream twice a day for up to 2 weeks    Restart hibiclens wash daily from the waist down (do not get in vagina, eyes, or ears) for two weeks  Resume Mupirocin inside nose twice daily for 10 days  Will send referral to infectious disease.  If You Need Anything After Your Visit  If you have any questions or concerns for your doctor, please call our main line at [phone] and press option 4 to reach your doctor's medical assistant. If no one answers, please leave a voicemail as directed and we will return your call as soon as possible. Messages left after 4 pm will be answered the following business day.   You may also send [name] a message via [place]. We typically respond to MyChart messages within 1-2 business days.  For prescription refills, please ask your pharmacy to contact our office. Our fax number is [phone].  If you have an urgent issue when the clinic is closed that cannot wait until the next business day, you can page your doctor at the number below.    Please note that while we do our best to be available for urgent issues outside of office hours, we are not available 24/7.   If you have an urgent issue and are unable to reach [name], you may choose to seek medical care at your doctor's office, retail clinic, urgent care center, or emergency room.  If you have a medical emergency, please immediately call 911 or go to the emergency department.  Pager Numbers  - Dr. Nehemiah Massed: [phone]  - Dr. Laurence Ferrari: [phone]  - Dr. Nicole Kindred: [phone]  In the event of inclement weather, please call our  main line at [phone] for an update on the status of any delays or closures.  Dermatology Medication Tips: Please keep the boxes that topical medications come in in order to help keep track of the instructions about where and how to use these. Pharmacies typically print the medication instructions only on the boxes and not directly on the medication tubes.   If your medication is too expensive, please contact our office at [phone] option 4 or send [name] a message through [place].   We are unable to tell what your co-pay for medications will be in advance as this is different depending on your insurance coverage. However, we may be able to find a substitute medication at lower cost or fill out paperwork to get insurance to cover a needed medication.   If a prior authorization is required to get your medication covered by your insurance company, please allow [name] 1-2 business days to complete this process.  Drug prices often vary depending on where the prescription is filled and some pharmacies may offer cheaper prices.  The website www.goodrx.com contains coupons for medications through different pharmacies. The prices here do not account for what the cost may be with help from insurance (it may be cheaper with your insurance), but the website can give you the price if you did not use any insurance.  - You can print the associated coupon and take it with your  prescription to the pharmacy.  - You may also stop by our office during regular business hours and pick up a GoodRx coupon card.  - If you need your prescription sent electronically to a different pharmacy, notify our office through [facility] or by phone at [phone] option 4.     Si Usted Necesita Algo Despus de Su Visita  Tambin puede enviarnos un mensaje a travs de Pharmacist, community. Por lo general respondemos a los mensajes de MyChart en el transcurso de 1 a 2 das hbiles.  Para renovar recetas, por favor pida a su  farmacia que se ponga en contacto con nuestra oficina. Harland Dingwall de fax es [place] [phone].  Si tiene un asunto urgente cuando la clnica est cerrada y que no puede esperar hasta el siguiente da hbil, puede llamar/localizar a su doctor(a) al nmero que aparece a continuacin.   Por favor, tenga en cuenta que aunque hacemos todo lo posible para estar disponibles para asuntos urgentes fuera del horario de [place], no estamos disponibles las 24 horas del da, los 7 das de la [place].   Si tiene un problema urgente y no puede comunicarse con nosotros, puede optar por buscar atencin mdica  en el consultorio de su doctor(a), en una clnica privada, en un centro de atencin urgente o en una sala de emergencias.  Si tiene Engineering geologist, por favor llame inmediatamente al 911 o vaya a la sala de emergencias.  Nmeros de bper  - Dr. Nehemiah Massed: [phone]  - Dra. Moye: [phone]  - Dra. Nicole Kindred: [phone]  En caso de inclemencias del [place], por favor llame a Johnsie Kindred principal al [phone] para una actualizacin sobre el [place] de cualquier retraso o cierre.  Consejos para la medicacin en dermatologa: Por favor, guarde las cajas en las que vienen los medicamentos de uso tpico para ayudarle a seguir las instrucciones sobre dnde y cmo usarlos. Las farmacias generalmente imprimen las instrucciones del medicamento slo en las cajas y no directamente en los tubos del [place].   Si su medicamento es muy caro, por favor, pngase en contacto con Zigmund Daniel llamando al [phone] y presione la opcin 4 o envenos un mensaje a travs de Pharmacist, community.   No podemos decirle cul ser su copago por los medicamentos por adelantado ya que esto es diferente dependiendo de la cobertura de su seguro. Sin embargo, es posible que podamos encontrar un medicamento sustituto a Electrical engineer un formulario para que el seguro cubra el medicamento que se considera necesario.    Si se requiere una autorizacin previa para que su compaa de seguros [place] su medicamento, por favor permtanos de 1 a 2 das hbiles para completar este proceso.  Los precios de los medicamentos varan con frecuencia dependiendo del Environmental consultant de dnde se surte la receta y alguna farmacias pueden ofrecer precios ms baratos.  El sitio web www.goodrx.com tiene cupones para medicamentos de Airline pilot. Los precios aqu no tienen en cuenta lo que podra costar con la ayuda del seguro (puede ser ms barato con su seguro), pero el sitio web puede darle el precio si no utiliz Research scientist (physical sciences).  - Puede imprimir el cupn correspondiente y llevarlo con su receta a la farmacia.  - Tambin puede pasar por nuestra oficina durante el horario de atencin regular y Charity fundraiser una tarjeta de cupones de GoodRx.  - Si necesita que su receta se enve electrnicamente a Chiropodist, informe a nuestra oficina a travs de [place] o  por telfono llamando al [phone] y presione la opcin 4.

## 2021-01-19 ENCOUNTER — Encounter: Payer: Self-pay | Admitting: Dermatology

## 2021-01-20 ENCOUNTER — Other Ambulatory Visit: Payer: Self-pay

## 2021-01-20 ENCOUNTER — Telehealth: Payer: Self-pay | Admitting: Dermatology

## 2021-01-20 DIAGNOSIS — A4902 Methicillin resistant Staphylococcus aureus infection, unspecified site: Secondary | ICD-10-CM

## 2021-01-20 NOTE — Progress Notes (Signed)
Internal referral for Dr. Delaine Lame with Infectious Disease.

## 2021-01-20 NOTE — Telephone Encounter (Signed)
Spoke with patient's cousin and healthcare POA to review plan. We have already referred to ID and I am checking her abscess back next week. We have a culture pending. Discussed that we did ask her caregiver again to call immediately if she develops any new lesions as we would want to start an antibiotic pill and try to work her in as quickly as possible to hopefully prevent worsening abscesses and ED visits. Previously another staff member had brought her to her appointments, but I did speak directly with her primary caregiver at this last visit, so hopefully that will help getting Lisette seen promptly in clinic whenever possible with any new lesions. All questions answered.

## 2021-01-25 ENCOUNTER — Encounter: Payer: Self-pay | Admitting: Dermatology

## 2021-01-25 ENCOUNTER — Telehealth: Payer: Self-pay

## 2021-01-25 ENCOUNTER — Ambulatory Visit (INDEPENDENT_AMBULATORY_CARE_PROVIDER_SITE_OTHER): Payer: Medicare Other | Admitting: Dermatology

## 2021-01-25 ENCOUNTER — Other Ambulatory Visit: Payer: Self-pay

## 2021-01-25 DIAGNOSIS — A4902 Methicillin resistant Staphylococcus aureus infection, unspecified site: Secondary | ICD-10-CM

## 2021-01-25 NOTE — Telephone Encounter (Signed)
Thank you :)

## 2021-01-25 NOTE — Telephone Encounter (Signed)
To make it clear, OK to prescribe in the future but just make PCP aware incase he wants to bring patient in to check levels sooner vs when patient is scheduled with their office.

## 2021-01-25 NOTE — Progress Notes (Signed)
° °  Follow-Up Visit   Subjective  Laurie Rice is a 61 y.o. female who presents for the following: abscess/MRSA (Recheck, 1 week,  right posterior thigh. Bacterial C&S shows MRSA. Took Bactrim DS as directed by ED. Referral has been sent to Dr. Delaine Lame. They have called patient once, no answer. Caretaker reports packing came out a couple of days after last visit. Using Mupirocin as directed).  Caretaker with patient. History obtained from caregiver.  The following portions of the chart were reviewed this encounter and updated as appropriate:  Tobacco   Allergies   Meds   Problems   Med Hx   Surg Hx   Fam Hx       Review of Systems: No other skin or systemic complaints except as noted in HPI or Assessment and Plan.   Objective  Well appearing patient in no apparent distress; mood and affect are within normal limits.  A focused examination was performed including face, right thigh. Relevant physical exam findings are noted in the Assessment and Plan.  Right Thigh - Posterior Healing ulceration   Assessment & Plan  MRSA (methicillin resistant Staphylococcus aureus) infection Right Thigh - Posterior  Healing well No longer appears infected   Order Changes for Mupirocin For Leg - Wash area with soap and water once daily. Apply Mupirocin once daily to area on leg, cover with bandage.   Order Changes for Mupirocin Continue mupirocin to nose twice a day for 10 days total  Continue washing with Hibiclens wash from the neck down except the vagina for 10 days total  Patient's caretaker to call Dr. Gwenevere Ghazi office to schedule appointment. Referral already sent.   We will also reach out to Dr. Reuel Boom office to confirm if ok for Laurie Rice to start bactrim ds in future (given her warfarin use) and inquire whether she needs to decrease warfarin dose at that time or be seen for a sooner INR check.   Return for eczema and stasis dermatitis recheck in 1 month.  I, Emelia Salisbury, CMA, am acting as scribe for Forest Gleason, MD.  Documentation: I have reviewed the above documentation for accuracy and completeness, and I agree with the above.  Forest Gleason, MD

## 2021-01-25 NOTE — Telephone Encounter (Deleted)
Left message with receptionist for PCP's nurse to return my call regarding:  If we need to use Bactrim DS for one week in future for her, do we need to have her reduce her coumadin dose or have a sooner INR check? T/C notes, no clear answer

## 2021-01-25 NOTE — Patient Instructions (Addendum)
Please call to schedule appointment. Referral has been sent:   Dr. Tsosie Billing. Infectious disease. Address: [address], [place], [place] [address] Phone: [phone]  Order Changes for Mupirocin For Leg - Wash area with soap and water once daily. Apply Mupirocin once daily to area on leg, cover with bandage.   Order Changes for Mupirocin Continue mupirocin to nose twice a day for 10 days today  Continue washing with Hibiclens wash from the neck down except the vagina for 10 days total  For any new spots, please call us right away when still small and we will do our best to work her in as soon as possible.  If You Need Anything After Your Visit  If you have any questions or concerns for your doctor, please call our main line at [phone] and press option 4 to reach your doctor's medical assistant. If no one answers, please leave a voicemail as directed and we will return your call as soon as possible. Messages left after 4 pm will be answered the following business day.   You may also send [name] a message via [place]. We typically respond to MyChart messages within 1-2 business days.  For prescription refills, please ask your pharmacy to contact our office. Our fax number is [phone].  If you have an urgent issue when the clinic is closed that cannot wait until the next business day, you can page your doctor at the number below.    Please note that while we do our best to be available for urgent issues outside of office hours, we are not available 24/7.   If you have an urgent issue and are unable to reach [name], you may choose to seek medical care at your doctor's office, retail clinic, urgent care center, or emergency room.  If you have a medical emergency, please immediately call 911 or go to the emergency department.  Pager Numbers  - Dr. Nehemiah Massed: [phone]  - Dr. Laurence Ferrari: [phone]  - Dr. Nicole Kindred: [phone]  In the event of inclement  weather, please call our main line at [phone] for an update on the status of any delays or closures.  Dermatology Medication Tips: Please keep the boxes that topical medications come in in order to help keep track of the instructions about where and how to use these. Pharmacies typically print the medication instructions only on the boxes and not directly on the medication tubes.   If your medication is too expensive, please contact our office at [phone] option 4 or send [name] a message through [employer].   We are unable to tell what your co-pay for medications will be in advance as this is different depending on your insurance coverage. However, we may be able to find a substitute medication at lower cost or fill out paperwork to get insurance to cover a needed medication.   If a prior authorization is required to get your medication covered by your insurance company, please allow [name] 1-2 business days to complete this process.  Drug prices often vary depending on where the prescription is filled and some pharmacies may offer cheaper prices.  The website www.goodrx.com contains coupons for medications through different pharmacies. The prices here do not account for what the cost may be with help from insurance (it may be cheaper with your insurance), but the website can give you the price if you did not use any insurance.  - You can print the associated coupon and take it with your prescription  to the pharmacy.  - You may also stop by our office during regular business hours and pick up a GoodRx coupon card.  - If you need your prescription sent electronically to a different pharmacy, notify our office through [facility] or by phone at [phone] option 4.     Si Usted Necesita Algo Despus de Su Visita  Tambin puede enviarnos un mensaje a travs de Pharmacist, community. Por lo general respondemos a los mensajes de MyChart en el transcurso de 1 a 2 das hbiles.  Para renovar recetas,  por favor pida a su farmacia que se ponga en contacto con nuestra oficina. Harland Dingwall de fax es [place] [phone].  Si tiene un asunto urgente cuando la clnica est cerrada y que no puede esperar hasta el siguiente da hbil, puede llamar/localizar a su doctor(a) al nmero que aparece a continuacin.   Por favor, tenga en cuenta que aunque hacemos todo lo posible para estar disponibles para asuntos urgentes fuera del horario de [place], no estamos disponibles las 24 horas del da, los 7 das de la Nue[place].   Si tiene un problema urgente y no puede comunicarse con nosotros, puede optar por buscar atencin mdica  en el consultorio de su doctor(a), en una clnica privada, en un centro de atencin urgente o en una sala de emergencias.  Si tiene Engineering geologist, por favor llame inmediatamente al 911 o vaya a la sala de emergencias.  Nmeros de bper  - Dr. Nehemiah Massed: [phone]  - Dra. Moye: [phone]  - Dra. Nicole Kindred: [phone]  En caso de inclemencias del [place], por favor llame a Johnsie Kindred principal al [phone] para una actualizacin sobre el [facility] de cualquier retraso o cierre.  Consejos para la medicacin en dermatologa: Por favor, guarde las cajas en las que vienen los medicamentos de uso tpico para ayudarle a seguir las instrucciones sobre dnde y cmo usarlos. Las farmacias generalmente imprimen las instrucciones del medicamento slo en las cajas y no directamente en los tubos del [place].   Si su medicamento es muy caro, por favor, pngase en contacto con Zigmund Daniel llamando al [phone] y presione la opcin 4 o envenos un mensaje a travs de Pharmacist, community.   No podemos decirle cul ser su copago por los medicamentos por adelantado ya que esto es diferente dependiendo de la cobertura de su seguro. Sin embargo, es posible que podamos encontrar un medicamento sustituto a Electrical engineer un formulario para que el seguro cubra el medicamento que se  considera necesario.   Si se requiere una autorizacin previa para que su compaa de seguros [place] su medicamento, por favor permtanos de 1 a 2 das hbiles para completar este proceso.  Los precios de los medicamentos varan con frecuencia dependiendo del Environmental consultant de dnde se surte la receta y alguna farmacias pueden ofrecer precios ms baratos.  El sitio web www.goodrx.com tiene cupones para medicamentos de Airline pilot. Los precios aqu no tienen en cuenta lo que podra costar con la ayuda del seguro (puede ser ms barato con su seguro), pero el sitio web puede darle el precio si no utiliz Research scientist (physical sciences).  - Puede imprimir el cupn correspondiente y llevarlo con su receta a la farmacia.  - Tambin puede pasar por nuestra oficina durante el horario de atencin regular y Charity fundraiser una tarjeta de cupones de GoodRx.  - Si necesita que su receta se enve electrnicamente a Chiropodist, informe a nuestra oficina a travs de MyChart de [employer] o por  telfono llamando al [phone] y presione la opcin 4.

## 2021-01-25 NOTE — Telephone Encounter (Signed)
Laurie Rice, Dr. Reuel Boom nurse, did return [name] phone call. I called and left her a message to please return our call. aw

## 2021-01-25 NOTE — Telephone Encounter (Signed)
Spoke with Maudie Mercury. According to the note on 01/18/21 with [facility], Dr. Lovie Macadamia was okay with patients current INR levels & coumadin dose. He wanted her to just keep her 4 week follow up.   Kim advised if we prescribe in the future we can just send over a fax or call in and let them know. Dr. Lovie Macadamia might bring patient in a little early to check INR levels.

## 2021-01-27 ENCOUNTER — Telehealth: Payer: Self-pay

## 2021-01-27 LAB — ANAEROBIC AND AEROBIC CULTURE

## 2021-01-27 NOTE — Telephone Encounter (Signed)
-----   Message from Alfonso Patten, MD sent at 01/27/2021  1:40 PM EST ----- MRSA as expected, with same sensitivities as last time. No change in treatment needed.

## 2021-01-27 NOTE — Telephone Encounter (Signed)
Advised pt's caregiver, Normaris, of culture results and advised no change in treatment, continue as directed.Mariana Kaufman

## 2021-01-28 ENCOUNTER — Encounter: Payer: Self-pay | Admitting: Dermatology

## 2021-02-03 ENCOUNTER — Ambulatory Visit: Payer: Medicare Other | Attending: Infectious Diseases | Admitting: Infectious Diseases

## 2021-02-03 ENCOUNTER — Encounter: Payer: Self-pay | Admitting: Infectious Diseases

## 2021-02-03 ENCOUNTER — Other Ambulatory Visit: Payer: Self-pay

## 2021-02-03 DIAGNOSIS — L98492 Non-pressure chronic ulcer of skin of other sites with fat layer exposed: Secondary | ICD-10-CM

## 2021-02-03 DIAGNOSIS — G40909 Epilepsy, unspecified, not intractable, without status epilepticus: Secondary | ICD-10-CM | POA: Diagnosis not present

## 2021-02-03 DIAGNOSIS — Z86711 Personal history of pulmonary embolism: Secondary | ICD-10-CM | POA: Diagnosis not present

## 2021-02-03 DIAGNOSIS — I10 Essential (primary) hypertension: Secondary | ICD-10-CM | POA: Insufficient documentation

## 2021-02-03 DIAGNOSIS — L089 Local infection of the skin and subcutaneous tissue, unspecified: Secondary | ICD-10-CM | POA: Insufficient documentation

## 2021-02-03 DIAGNOSIS — Z7901 Long term (current) use of anticoagulants: Secondary | ICD-10-CM | POA: Insufficient documentation

## 2021-02-03 DIAGNOSIS — A4902 Methicillin resistant Staphylococcus aureus infection, unspecified site: Secondary | ICD-10-CM | POA: Diagnosis present

## 2021-02-03 DIAGNOSIS — R21 Rash and other nonspecific skin eruption: Secondary | ICD-10-CM | POA: Diagnosis not present

## 2021-02-03 DIAGNOSIS — B9562 Methicillin resistant Staphylococcus aureus infection as the cause of diseases classified elsewhere: Secondary | ICD-10-CM | POA: Insufficient documentation

## 2021-02-03 MED ORDER — CHLORHEXIDINE GLUCONATE 4 % EX LIQD: CUTANEOUS | 2 refills | Status: DC

## 2021-02-03 MED ORDER — RIFAMPIN 300 MG PO CAPS: 300.0000 mg | ORAL_CAPSULE | Freq: Two times a day (BID) | ORAL | 0 refills | Status: DC

## 2021-02-03 MED ORDER — LINEZOLID 600 MG PO TABS: 600.0000 mg | ORAL_TABLET | Freq: Two times a day (BID) | ORAL | 0 refills | Status: DC

## 2021-02-03 MED ORDER — MUPIROCIN 2 % EX OINT: TOPICAL_OINTMENT | CUTANEOUS | 3 refills | Status: DC

## 2021-02-03 NOTE — Progress Notes (Signed)
NAME: Laurie Rice  DOB: Jul 08, 1960  MRN: [id number]  Date/Time: 02/03/2021 11:25 AM   Subjective:  Referred by Dr.Moya Dermatologist for recurrent MRSA skin infection ?Pt here with her cousin ( Guardian) and group home owner Laurie Rice is a 61 y.o. female with a history of seizure disorder, with mental disability, h/o PE on coumadin , lives in a group home presents with recurrent MRSA skin infections for nearly a year- She has seen Dermatologist and has been treated with courses of bactrim-  On OCT 18 , 2022  she got dalbavancin in the ED and is thought to have developed a rash after that. It was thought to be due to dalbavancin. But she has been noted to have rash on certain parts of her body even before that. 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   History of lymphocytosis    Impetigo    History of   Liver masses    History of, Benign   Meningioma (HCC)    Mild mental retardation    Monoclonal gammopathy    low level   Personal history of pulmonary embolism    Seizures (HCC)    Thyroid mass    history of    Past Surgical History:  Procedure Laterality Date   NO PAST SURGERIES      Social History   Socioeconomic History   Marital status: Single    Spouse name: Not on file   Number of children: Not on file   Years of education: Not on file   Highest education level: Not on file  Occupational History   Not on file  Tobacco Use   Smoking status: Never   Smokeless tobacco: Never  Substance and Sexual Activity   Alcohol use: No   Drug use: No   Sexual activity: Not on file  Other Topics Concern   Not on file  Social History Narrative   Not on file   Social Determinants of Health   Financial Resource Strain: Not on file  Food Insecurity: Not on file  Transportation  Needs: Not on file  Physical Activity: Not on file  Stress: Not on file  Social Connections: Not on file  Intimate Partner Violence: Not on file    Family History  Problem Relation Age of Onset   Hypertension Other    Arthritis Other    Breast cancer Neg Hx    Allergies  Allergen Reactions   Dalbavancin Rash    Widespread rash with no systemic symptoms   I? Current Outpatient Medications  Medication Sig Dispense Refill   bisoprolol-hydrochlorothiazide (ZIAC) 2.5-6.25 MG tablet Take 2 tablets by mouth daily.     chlorhexidine (HIBICLENS) 4 % external liquid Wash from the neck down excluding the vagina as directed while showering. Do not get in the eyes or ears. 120 mL 2   Emollient (CETAPHIL) cream      ferrous sulfate 325 (65 FE) MG EC tablet Take 1 tablet (325 mg total) by mouth daily with breakfast. With orange juice 30 tablet 3   folic acid (FOLVITE) 1 MG tablet TAKE ONE TABLET BY MOUTH EACH DAY.     hydrocortisone 2.5 % cream Apply twice daily to abdominal area for two weeks. Apply 2nd 60 g 3  ketoconazole (NIZORAL) 2 % cream Apply twice daily to abdominal area for 2 weeks. Apply 1st 60 g 2   mupirocin ointment (BACTROBAN) 2 % Discontinue all previous orders for Mupirocin and apply to aa's as directed. 22 g 3   PHENObarbital (LUMINAL) 60 MG tablet Take 60 mg by mouth at bedtime.     phenytoin (DILANTIN) 100 MG ER capsule TAKE 2 CAPSULES BY MOUTH AT BEDTIME (SEIZURE DISORDER)     phenytoin (DILANTIN) 50 MG tablet Chew 50 mg by mouth at bedtime.     triamcinolone ointment (KENALOG) 0.1 % APPLY TWICE DAILY TO RASH ON BODY. AVOIDING FACE, GROIN, AND UNDERARMS. DO NOT MIX  THIS WITH CERAVE CREAM. 454 g 0   warfarin (COUMADIN) 1 MG tablet TAKE 2 TABLETS (2MG ) BY MOUTH ONCE DAILY (ALONG W/ 5MG =7MG  TOTAL DOSAGE).     warfarin (COUMADIN) 5 MG tablet      warfarin (COUMADIN) 6 MG tablet Take 6 mg by mouth at bedtime.     predniSONE (DELTASONE) 5 MG tablet Start 2 week taper as  instructed. (Patient not taking: Reported on 02/03/2021) 100 tablet 0   No current facility-administered medications for this visit.     Abtx:  Anti-infectives (From admission, onward)    None       REVIEW OF SYSTEMS:  Const: negative fever, negative chills, negative weight loss Eyes: negative diplopia or visual changes, negative eye pain ENT: negative coryza, negative sore throat Resp: negative cough, hemoptysis, dyspnea Cards: negative for chest pain, palpitations, lower extremity edema GU: negative for frequency, dysuria and hematuria GI: Negative for abdominal pain, diarrhea, bleeding, constipation Skin: recurrent pustules  Heme: negative for easy bruising and gum/nose bleeding MS: general weakness Neurolo:seizure disorder Psych: negative for feelings of anxiety, depression  Endocrine: negative for thyroid, diabetes Allergy/Immunology-Dalbavancin ?  Objective:  VITALS:  BP 118/68    Pulse 72    Temp 99.1 F (37.3 C) (Oral)    Wt 195 lb (88.5 kg)    BMI 30.54 kg/m  PHYSICAL EXAM:  General: Alert, cooperative, some metal disability Head: Normocephalic, without obvious abnormality, atraumatic. Eyes: Conjunctivae clear, anicteric sclerae. Pupils are equal ENT Nares normal. No drainage or sinus tenderness. dentures Neck: , symmetrical, no adenopathy, thyroid: non tender no carotid bruit and no JVD. Lungs: Clear to auscultation bilaterally. No Wheezing or Rhonchi. No rales. Heart: Regular rate and rhythm, no murmur, rub or gallop. Abdomen: not examined Extremities: rt thigh pustule Back of thigh- wound with slough of 2 cm Skin: hyperpigmentation over legs  Lymph: Cervical, supraclavicular normal. Neurologic: did not examine in detail Pertinent Labs 01/07/21 Cr 0.75 AST18 ALT 14 Hb 10.8 PLT 200 WBC 9.8    Microbiology: 01/18/21- MRSA  ? Impression/Recommendation Recurrent MRSA skin infection She has taken many courses of bactrim Will treat her with  linezolid+rifampin combo in order to decolonize  After oral antibiotics for 10 days will do mupirocin nares BID and chlorhexidine 4% body wash QD body wash for 10 days Discussed the side effects of both antibiotics and drug interactions with coumadin- will need to check INR in 5 days Also discussed foods to avoid and side effects to observe Gave material on linezolid and rifampin  H/o PE on coumadin  Seizure disorder on dilantin and phenobarbitol  HTN on bisoprolol+ HCTZ? ? ___________________________________________________ Discussed with patient,her cousin and group home attendant Follow up 2 weeks Note:  This document was prepared using Dragon voice recognition software and may include unintentional dictation errors.

## 2021-02-03 NOTE — Patient Instructions (Addendum)
You are here for MRSA recurrent skin infections for the past 1 year or more You have been on multiple courses of bactrim You now have another 2 lesions- the last wound culture from 01/18/21 has MRSA We will do rifampin 300mg  PO BID and linezolid 600mg  Po BID X 10 days This will be followed by mupirocin ointment on both nares ( inside) for [redacted] weeks along with chlrohexidine 2% wash for 2 weeks ( below the neck) Follow up 2 weeks  Prepare Chose a period when you will be uninterrupted by going away or other distractions. To ensure that your skin is in good condition, follow the Routine Skin Care principles below to reduce drying and enhance healing. Do not start while you have any active boils. Routine Skin Care principles to reduce drying and enhance healing: Avoid the use of soap when bathing or showering when performing this decolonization. DO NOT routinely use antiseptic solutions. If you need to use something, chose a soap substitute (examples -QV Wash or Cetaphil).  When drying with a towel, be gentle and pat dry your skin. Avoid rubbing the skin.  Reduce the overall frequency of bathing or showering. A short shower (3 minutes) is better than a bath in terms of its effect on the skin.  Use a simple sorbelene based-cream on your skin prior to showering and immediately after drying. (Examples: Hydroderm or other Sorbelene-based preparations). Especially protect healing or dry areas of skin in this way. Dont use a barrier cream with a vaseline base or with perfumes and additives. You are more likely to be allergic to these products, and cause further damage to your skin.  Make sure that you clean and cover any skin cuts or grazes that occur. Try to avoid picking or biting fingernails and the skin around the nails Keep your fingernails clipped short and clean to reduce problems caused by scratching.  For itchy skin, try gently massaging sorbelene-based cream into itchy areas instead of scratching it. A  long-acting non-sedating antihistamine drug is the next option to try. However make sure that you are not taking medication that will interact with this drug type.                                                                         To prepare yourself for your treatment, it is recommended that you complete the following steps: Remove nose, ear and other body piercing items for several days prior to the treatment and keep them out during the treatment period  Purchase a new toothbrush, disposable razor (if used), sterident for dentures (if required) and a container of alcohol hand hygiene solution (gel or rub)  Discard old toothbrushes and razors when the treatment starts. Also discard opened deodorant rollers, skin adhesive tapes, skin creams and solutions- all of these may already be contaminated with staph  Discard pumice stone(s), sponges and disposable face cloths if used   Discard all make-up brushes, creams, and implements  Discard or hot wash all fluffy toys  Wash hair brush and comb, nail files, plastic toys and cutters in the dishwasher or purchase new ones  Remove nail varnish and artificial nails  Daily routine for 5 days     *Minimize contact with  members of the community during the 5 days of treatment as much as possible* Body washes The effectiveness of the program increases if the correct procedure is used: Apply the provided antiseptic body wash (2% chlorhexidine) in the shower daily  Take care to wash hair, under the arms and into the groin and into any folds of skin  Allow the antiseptic to remain on the skin for at least 5 minutes  Nasal ointment Disinfect your hands with alcohol gel/rub and allow to dry   Open the mupirocin 2% (Bactroban) nasal ointment.  Place small amount (size of match head) of ointment onto a clean cotton swab and massage gently around the inside of the nostril on one side, making sure not to insert it too deeply (no more than 2 cm or a little less  than an inch).  Use a new cotton bud for the other nostril so that you do not contaminate the Bactroban tube with staph.   After applying the ointment, press a finger against the nose next to the nostril opening and use a circular motion to spread the ointment within the nose  Apply the mupirocin ointment two times a day for 5 days  Disinfect your hands with alcohol rub/gel after applying the ointmentp>  Personal items (combs, razor, eyeglasses, jewelry, etc.)     Disinfect all personal items daily with an alcohol-based cleanser  House Environment and Clothes/Linens On day 2 and 5 of the treatment, clean your house, (especially the bedroom and bathroom). Clean dust off all surfaces and then vacuum clean all floor surfaces AND soft furnishings (such as your favorite chair). If your chair/couch has a vinyl or leather covering then wipe over the chair with warm soapy water and then dry with a clean towel (which should then be washed). Staph lives in skin scales from humans that contaminate the environment. This can lead to re-infection.   Disinfect the shower floor and/or bath tub daily   On days 1, 3, AND 5 of the treatment wash your clothes, underwear, pajamas and bed linen (such as towels, sheets, washcloths, and bath mats). A hot wash with laundry detergent is best (there is no need to use expensive laundry detergent or powder). Dry clothes in sun if possible. Change into clean clothes or pajamas on those days after your shower.   Do not share or exchange any personal items of clothing  . Follow-up after decolonization treatment  Possible approaches will vary depending on how your treatment goes. Your provider will instruct you on the follow-up best suited for you. Some options are:  Wait and see - if no further boils occur within 6 months then it is probable that the strain of staph has been eliminated from you.   Continue intermittent body washes 1-2 times per week with 2% aqueous chlorhexidine  soap preparation or similar

## 2021-02-08 ENCOUNTER — Telehealth: Payer: Self-pay

## 2021-02-08 NOTE — Telephone Encounter (Signed)
I spoke with Elvia with the Group Home to follow up with the patient. Per Elie Confer patient is tolerating the Linezolid and Bactrim well and both were started last Friday. She stated they have not had a chance yet to check the patient's INR, but will this week. Melina Copa also wanted to know if patient's follow up appointment could be pushed out to March due to no one being able to bring the patient on 02/17/21.  Jaliya Siegmann T Brooks Sailors

## 2021-02-09 ENCOUNTER — Other Ambulatory Visit: Payer: Self-pay

## 2021-02-09 ENCOUNTER — Ambulatory Visit
Admission: RE | Admit: 2021-02-09 | Discharge: 2021-02-09 | Disposition: A | Discharge status: Home / Self Care | Payer: Medicare Other | Source: Ambulatory Visit | Attending: Oncology | Admitting: Oncology

## 2021-02-09 DIAGNOSIS — M7989 Other specified soft tissue disorders: Secondary | ICD-10-CM | POA: Diagnosis not present

## 2021-02-09 NOTE — Telephone Encounter (Signed)
I spoke to the patient's caregiver with [facility] and to relay new appointment information 04/05/21@ 10:15 am. Ceclia Koker Tilda Burrow, CMA

## 2021-02-12 IMAGING — MG DIGITAL SCREENING BILAT W/ TOMO W/ CAD
6 series · 6 of 14 positions shown · non-contrast
Comparison: Previous exam(s).

CLINICAL DATA: Screening.

EXAM:
DIGITAL SCREENING BILATERAL MAMMOGRAM WITH TOMO AND CAD

[R CC synth-2D]
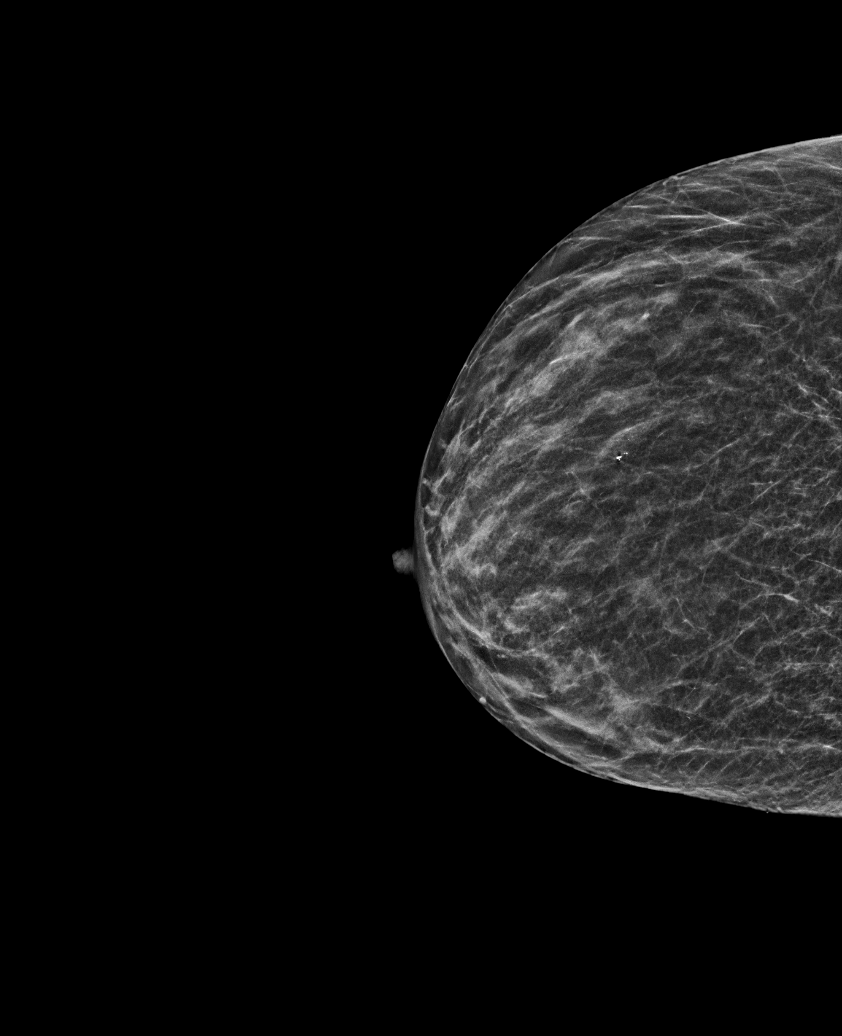

[L CC synth-2D]
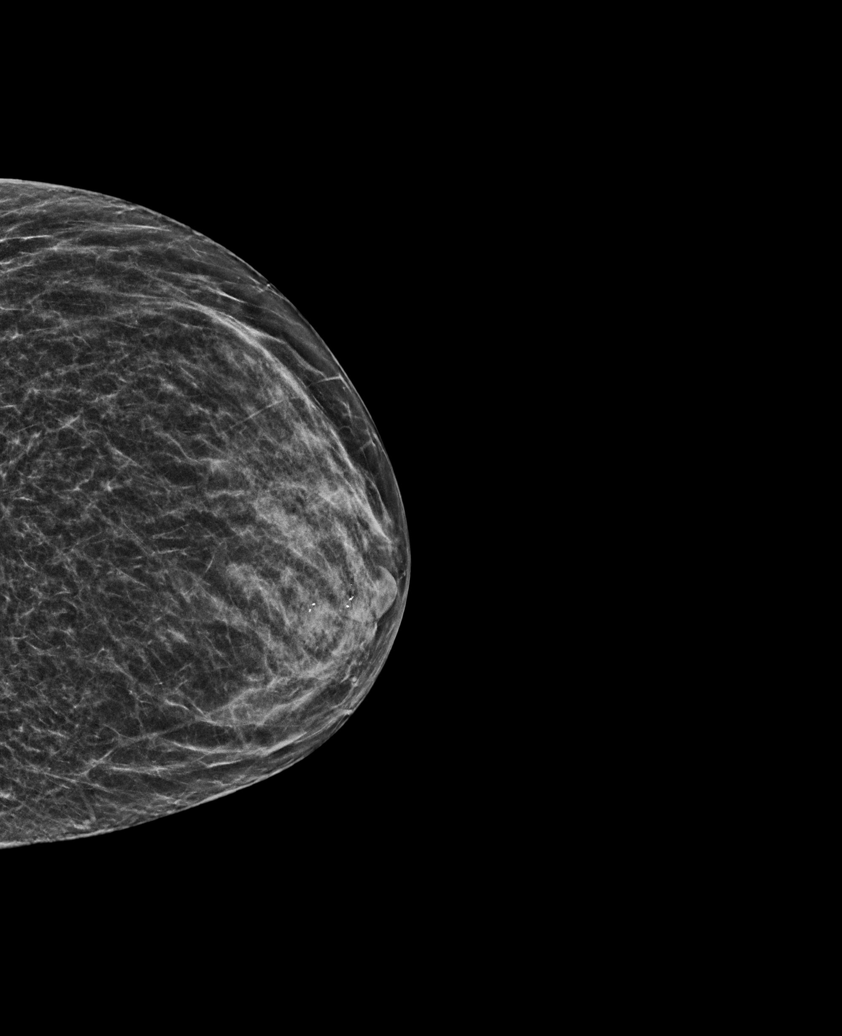

[L MLO synth-2D]
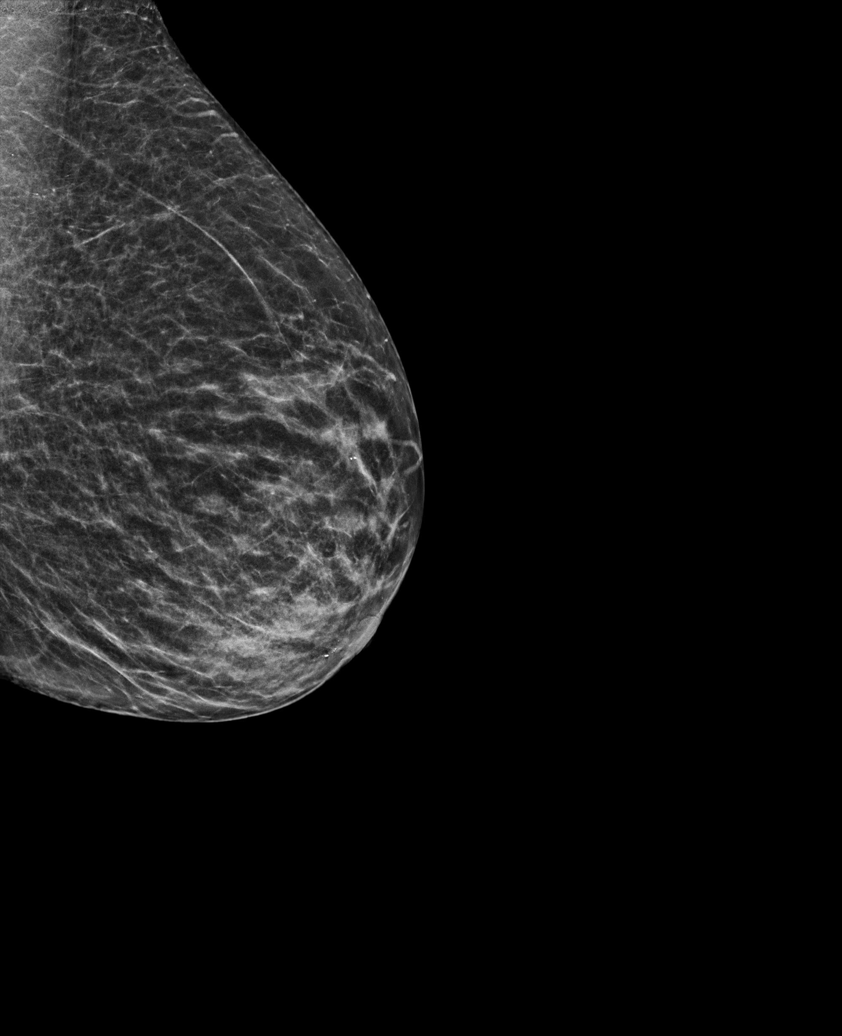

[R MLO synth-2D]
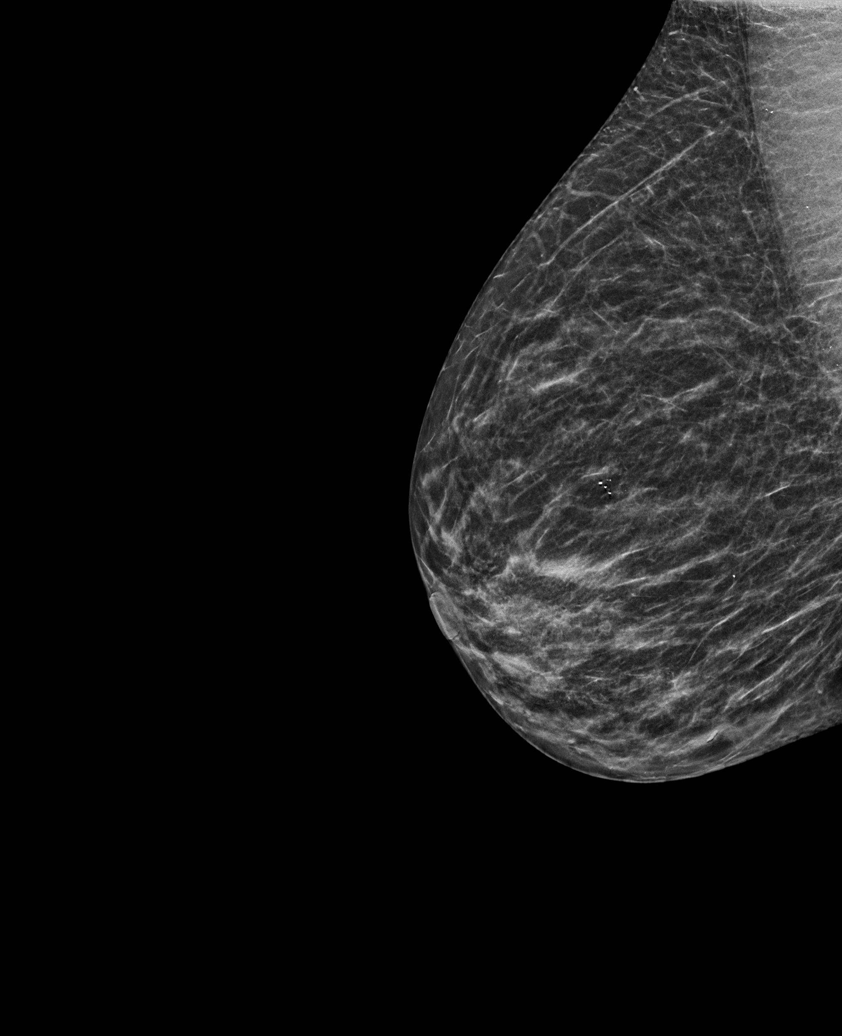

[R MLO tomo · tomo slice 25/48.0]
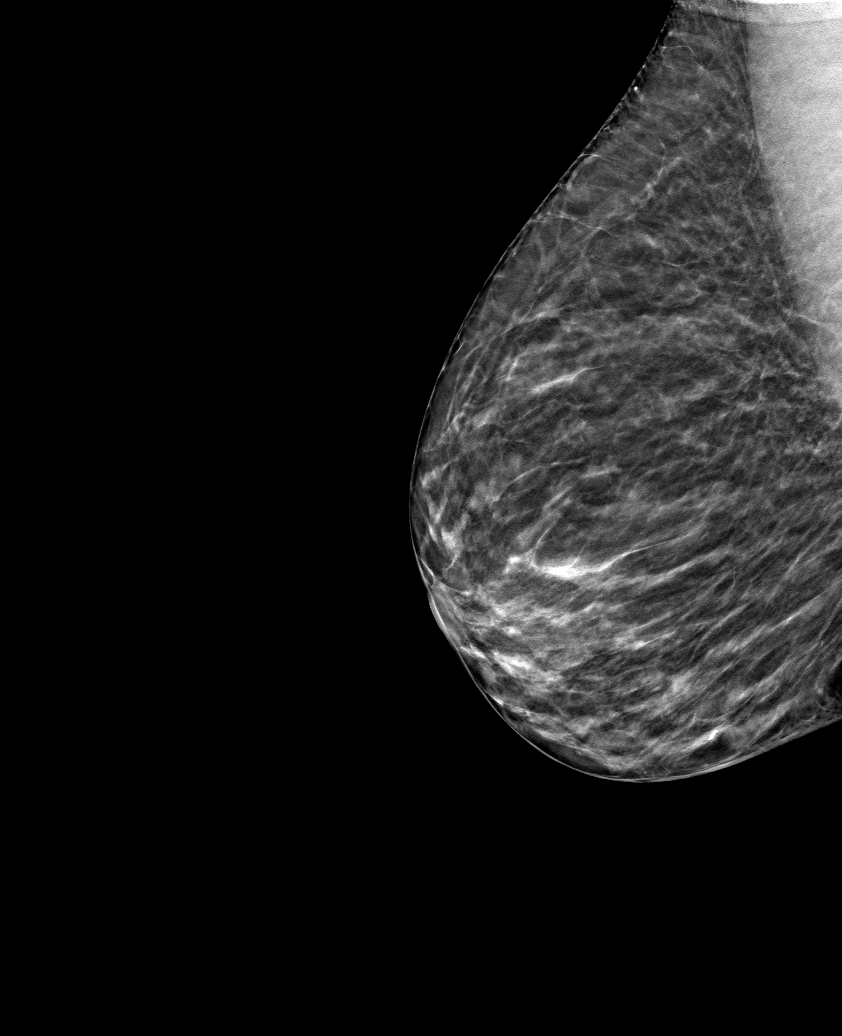

[L CC tomo · tomo slice 25/49.0]
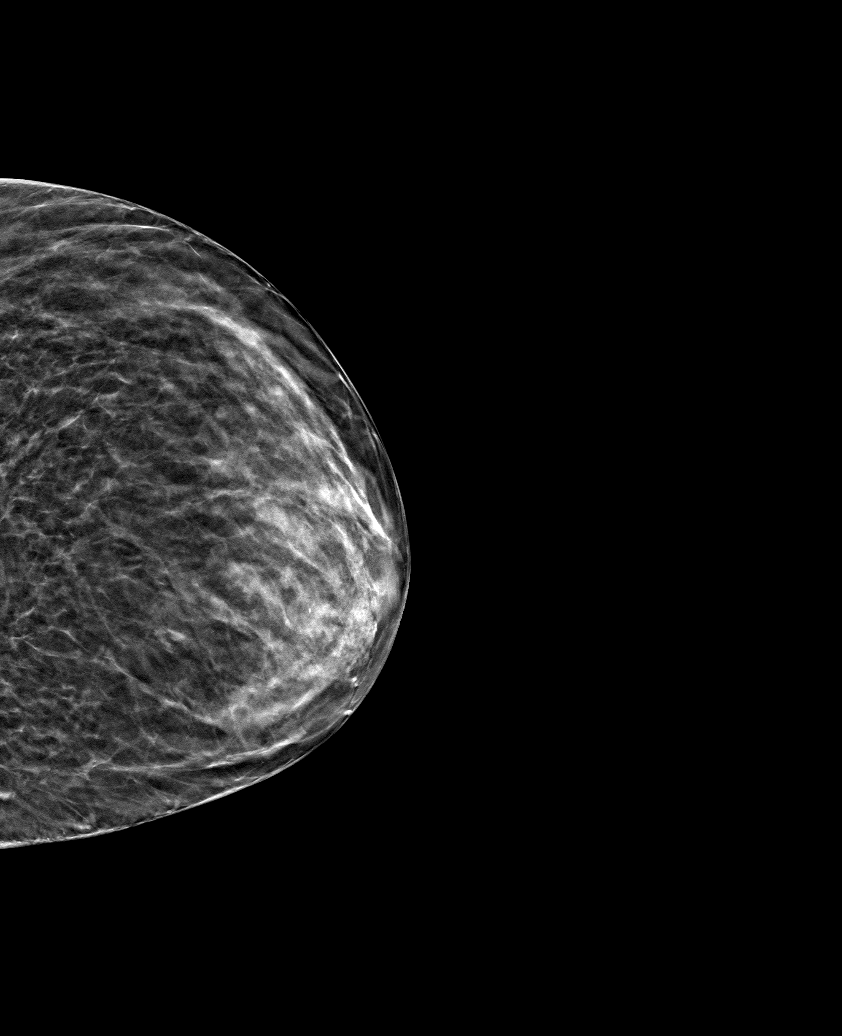

[6 of 14 positions shown; findings below may reference images not displayed]

ACR Breast Density Category c: The breast tissue is heterogeneously
dense, which may obscure small masses.
FINDINGS: There are no findings suspicious for malignancy. Images were
processed with CAD.
IMPRESSION: No mammographic evidence of malignancy. A result letter of this
screening mammogram will be mailed directly to the patient.

RECOMMENDATION:
Screening mammogram in one year. (Code:[id number]-U-LHB)

BI-RADS CATEGORY  1: Negative.

## 2021-02-17 ENCOUNTER — Ambulatory Visit: Payer: Medicare Other | Admitting: Infectious Diseases

## 2021-02-26 ENCOUNTER — Emergency Department: Payer: Medicare Other

## 2021-02-26 ENCOUNTER — Inpatient Hospital Stay
Admission: EM | Admit: 2021-02-26 | Discharge: 2021-03-01 | DRG: 603 | Disposition: A | Discharge status: Home Health | Payer: Medicare Other | Attending: Internal Medicine | Admitting: Internal Medicine

## 2021-02-26 ENCOUNTER — Encounter: Payer: Self-pay | Admitting: Emergency Medicine

## 2021-02-26 ENCOUNTER — Other Ambulatory Visit: Payer: Self-pay

## 2021-02-26 DIAGNOSIS — Z7901 Long term (current) use of anticoagulants: Secondary | ICD-10-CM

## 2021-02-26 DIAGNOSIS — B9562 Methicillin resistant Staphylococcus aureus infection as the cause of diseases classified elsewhere: Secondary | ICD-10-CM | POA: Diagnosis present

## 2021-02-26 DIAGNOSIS — Z20822 Contact with and (suspected) exposure to covid-19: Secondary | ICD-10-CM | POA: Diagnosis present

## 2021-02-26 DIAGNOSIS — D509 Iron deficiency anemia, unspecified: Secondary | ICD-10-CM | POA: Diagnosis present

## 2021-02-26 DIAGNOSIS — L089 Local infection of the skin and subcutaneous tissue, unspecified: Secondary | ICD-10-CM | POA: Diagnosis not present

## 2021-02-26 DIAGNOSIS — L0390 Cellulitis, unspecified: Secondary | ICD-10-CM | POA: Diagnosis present

## 2021-02-26 DIAGNOSIS — C8510 Unspecified B-cell lymphoma, unspecified site: Secondary | ICD-10-CM | POA: Diagnosis present

## 2021-02-26 DIAGNOSIS — D329 Benign neoplasm of meninges, unspecified: Secondary | ICD-10-CM

## 2021-02-26 DIAGNOSIS — L03115 Cellulitis of right lower limb: Secondary | ICD-10-CM | POA: Diagnosis not present

## 2021-02-26 DIAGNOSIS — Z86718 Personal history of other venous thrombosis and embolism: Secondary | ICD-10-CM

## 2021-02-26 DIAGNOSIS — Z8249 Family history of ischemic heart disease and other diseases of the circulatory system: Secondary | ICD-10-CM

## 2021-02-26 DIAGNOSIS — Z86711 Personal history of pulmonary embolism: Secondary | ICD-10-CM | POA: Diagnosis present

## 2021-02-26 DIAGNOSIS — I10 Essential (primary) hypertension: Secondary | ICD-10-CM | POA: Diagnosis present

## 2021-02-26 DIAGNOSIS — Z79899 Other long term (current) drug therapy: Secondary | ICD-10-CM

## 2021-02-26 DIAGNOSIS — F70 Mild intellectual disabilities: Secondary | ICD-10-CM | POA: Diagnosis present

## 2021-02-26 DIAGNOSIS — G4089 Other seizures: Secondary | ICD-10-CM | POA: Diagnosis present

## 2021-02-26 DIAGNOSIS — Z8614 Personal history of Methicillin resistant Staphylococcus aureus infection: Secondary | ICD-10-CM

## 2021-02-26 DIAGNOSIS — R569 Unspecified convulsions: Secondary | ICD-10-CM

## 2021-02-26 LAB — CBC WITH DIFFERENTIAL/PLATELET
Abs Immature Granulocytes: 0.03 10*3/uL (ref 0.00–0.07)
Basophils Absolute: 0 10*3/uL (ref 0.0–0.1)
Basophils Relative: 0 %
Eosinophils Absolute: 0.2 10*3/uL (ref 0.0–0.5)
Eosinophils Relative: 2 %
HCT: 37.3 % (ref 36.0–46.0)
Hemoglobin: 11 g/dL — ABNORMAL LOW (ref 12.0–15.0)
Immature Granulocytes: 0 %
Lymphocytes Relative: 21 %
Lymphs Abs: 2.3 10*3/uL (ref 0.7–4.0)
MCH: 21.2 pg — ABNORMAL LOW (ref 26.0–34.0)
MCHC: 29.5 g/dL — ABNORMAL LOW (ref 30.0–36.0)
MCV: 71.9 fL — ABNORMAL LOW (ref 80.0–100.0)
Monocytes Absolute: 0.8 10*3/uL (ref 0.1–1.0)
Monocytes Relative: 7 %
Neutro Abs: 7.7 10*3/uL (ref 1.7–7.7)
Neutrophils Relative %: 70 %
Platelets: 283 10*3/uL (ref 150–400)
RBC: 5.19 MIL/uL — ABNORMAL HIGH (ref 3.87–5.11)
RDW: 18 % — ABNORMAL HIGH (ref 11.5–15.5)
WBC: 11 10*3/uL — ABNORMAL HIGH (ref 4.0–10.5)
nRBC: 0 % (ref 0.0–0.2)

## 2021-02-26 LAB — COMPREHENSIVE METABOLIC PANEL
ALT: 15 U/L (ref 0–44)
AST: 23 U/L (ref 15–41)
Albumin: 3.3 g/dL — ABNORMAL LOW (ref 3.5–5.0)
Alkaline Phosphatase: 73 U/L (ref 38–126)
Anion gap: 9 (ref 5–15)
BUN: 16 mg/dL (ref 8–23)
CO2: 27 mmol/L (ref 22–32)
Calcium: 8.6 mg/dL — ABNORMAL LOW (ref 8.9–10.3)
Chloride: 104 mmol/L (ref 98–111)
Creatinine, Ser: 0.71 mg/dL (ref 0.44–1.00)
GFR, Estimated: >60 mL/min (ref >60)
Glucose, Bld: 103 mg/dL — ABNORMAL HIGH (ref 70–99)
Potassium: 4.4 mmol/L (ref 3.5–5.1)
Sodium: 140 mmol/L (ref 135–145)
Total Bilirubin: 0.5 mg/dL (ref 0.3–1.2)
Total Protein: 6.3 g/dL — ABNORMAL LOW (ref 6.5–8.1)

## 2021-02-26 LAB — RESP PANEL BY RT-PCR (FLU A&B, COVID) ARPGX2
Influenza A by PCR: NEGATIVE
Influenza B by PCR: NEGATIVE
SARS Coronavirus 2 by RT PCR: NEGATIVE

## 2021-02-26 LAB — LACTIC ACID, PLASMA: Lactic Acid, Venous: 1.2 mmol/L (ref 0.5–1.9)

## 2021-02-26 MED ORDER — SODIUM CHLORIDE 0.9 % IV BOLUS
1000.0000 mL | Freq: Once | INTRAVENOUS | Status: AC
  Administered 2021-02-26: 1000 mL via INTRAVENOUS

## 2021-02-26 MED ORDER — VANCOMYCIN HCL IN DEXTROSE 1-5 GM/200ML-% IV SOLN: 1000.0000 mg | Freq: Once | INTRAVENOUS | Status: DC

## 2021-02-26 MED ORDER — ENOXAPARIN SODIUM 60 MG/0.6ML IJ SOSY: 0.5000 mg/kg | PREFILLED_SYRINGE | INTRAMUSCULAR | Status: DC

## 2021-02-26 MED ORDER — SODIUM CHLORIDE 0.9 % IV SOLN
2.0000 g | Freq: Once | INTRAVENOUS | Status: AC
  Administered 2021-02-26: 2 g via INTRAVENOUS
  Filled 2021-02-26: qty 2

## 2021-02-26 MED ORDER — VANCOMYCIN HCL 2000 MG/400ML IV SOLN
2000.0000 mg | Freq: Once | INTRAVENOUS | Status: AC
  Administered 2021-02-27: 2000 mg via INTRAVENOUS
  Filled 2021-02-26: qty 400

## 2021-02-26 MED ORDER — METRONIDAZOLE 500 MG/100ML IV SOLN
500.0000 mg | Freq: Once | INTRAVENOUS | Status: DC
  Administered 2021-02-27: 500 mg via INTRAVENOUS
  Filled 2021-02-26: qty 100

## 2021-02-26 MED ORDER — PHENYTOIN 50 MG PO CHEW
50.0000 mg | CHEWABLE_TABLET | Freq: Every day | ORAL | Status: DC
  Administered 2021-02-27 – 2021-02-28 (×3): 50 mg via ORAL
  Filled 2021-02-26 (×4): qty 1

## 2021-02-26 NOTE — Consult Note (Signed)
PHARMACY -  BRIEF ANTIBIOTIC NOTE   Pharmacy has received consult(s) for Cefepime and Vancomycin from an ED provider.  The patient's profile has been reviewed for ht/wt/allergies/indication/available labs.    One time order(s) placed for  Cefepime 2gm x1 Vancomycin 2000mg  x 1   Further antibiotics/pharmacy consults should be ordered by admitting physician if indicated.                       Thank you, Kit Brubacher Rodriguez-Guzman PharmD, BCPS 02/26/2021 7:50 PM

## 2021-02-26 NOTE — Hospital Course (Addendum)
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  Vitals in the emergency department show temperature of 98, respiration rate of 17, heart rate 84, blood pressure 147/86, SPO2 of 93% on room air.  Serum sodium 140, potassium 4.4, chloride 104, bicarb 27, BUN of 16, serum creatinine of 0.71, nonfasting blood glucose 103, WBC 11, hemoglobin 11, platelets of 283.  Lactic acid was 1.2.  ED treatment: Cefepime, metronidazole, vancomycin, sodium chloride 1 L bolus.  Right lower extremity ultrasound was ordered and negative for DVT.

## 2021-02-26 NOTE — ED Provider Triage Note (Signed)
Emergency Medicine Provider Triage Evaluation Note  Laurie Rice , a 61 y.o. female  was evaluated in triage.  Pt complains of rash and weeping to the right leg, upper arms, swelling of the right lower leg..  Review of Systems  Positive: Swelling of right lower leg, rash, weeping at the wound Negative: Fever, chills  Physical Exam  There were no vitals taken for this visit. Gen:   Awake, no distress   Resp:  Normal effort  MSK:   Largely swollen right lower extremity with discoloration anteriorly and large amount of weeping Other:    Medical Decision Making  Medically screening exam initiated at 6:07 PM.  Appropriate orders placed.  [name] was informed that the remainder of the evaluation will be completed by another provider, this initial triage assessment does not replace that evaluation, and the importance of remaining in the ED until their evaluation is complete.  Instructed nursing staff to start septic work-up secondary to appearance of the wound   [name], PA-C 02/26/21 1808

## 2021-02-26 NOTE — H&P (Signed)
History and Physical   NIKIESHA MILFORD VHQ:[id number] DOB: May 04, 1960 DOA: 02/26/2021  PCP: Juluis Pitch, MD  Outpatient Specialists: Dr. Laurence Ferrari, dermatology Patient coming from: Group home  I have personally briefly reviewed patient's old medical records in [place].  Chief Concern: Cellulitis.  HPI: 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  Vitals in the emergency department show temperature of 98, respiration rate of 17, heart rate 84, blood pressure 147/86, SPO2 of 93% on room air.  Serum sodium 140, potassium 4.4, chloride 104, bicarb 27, BUN of 16, serum creatinine of 0.71, nonfasting blood glucose 103, WBC 11, hemoglobin 11, platelets of 283.  Lactic acid was 1.2.  ED treatment: Cefepime, metronidazole, vancomycin, sodium chloride 1 L bolus.  Right lower extremity ultrasound was ordered and negative for DVT.  At bedside patient is able to tell me her name, her age, her current location.  She states that she lives in a home she states that she was brought to the hospital because of swelling in her legs concerning for wounds.  She also has bilateral arm rashes and lesions.  She states that she was showering by herself and she is not supposed to and she was supposed to have help, and she used the wrong soap.  She denies being any pain, shortness of breath, chest pain.  She states that the rashes on her body was told that it would heal eventually and slowly.  She was not able to tell me how long her leg has been swollen.  She was unable to tell me how long the rashes have been on her arms and belly.  Social history: She lives in a group home.  Her mother has passed  away and her guardian is a cousin.  ROS: Constitutional: no weight change, no fever ENT/Mouth: no sore throat, no rhinorrhea Eyes: no eye pain, no vision changes Cardiovascular: no chest pain, no dyspnea,  + edema, no palpitations Respiratory: no cough, no sputum, no wheezing Gastrointestinal: no nausea, no vomiting, no diarrhea, no constipation Genitourinary: no urinary incontinence, no dysuria, no hematuria Musculoskeletal: no arthralgias, no myalgias Skin: + skin lesions, no pruritus Neuro: + weakness, no loss of consciousness, no syncope Psych: no anxiety, no depression, + decrease appetite Heme/Lymph: no bruising, no bleeding  ED Course: Discussed with emergency medicine provider, patient requiring hospitalization for chief concerns of cellulitis.  Assessment/Plan  Principal Problem:   Cellulitis Active Problems:   Meningioma (HCC)   Microcytic anemia   Low grade B-cell lymphoma (HCC)   Seizures (HCC)   Essential hypertension   History of pulmonary embolus (PE)   Infection of skin due to methicillin resistant Staphylococcus aureus (MRSA)    Cardiovascular and Mediastinum Essential hypertension Assessment & Plan - Resumed home bisoprolol-hydrochlorothiazide 2-6.25 mg tablet, 2 tablet daily  Musculoskeletal and Integument Infection of skin due to methicillin resistant Staphylococcus aureus (MRSA) Assessment & Plan - History of recurrent MRSA skin infection, follows with ID and dermatology - Recheck MRSA PCR - Treated with linezolid and rifampin for 10-day course per ID note on 02/03/2021 - Continue with vancomycin at this time pending MRSA PCR - A.m. team to consider ID consultation for further assistance and evaluation in the a.m.  Other History of pulmonary embolus (PE)  Assessment & Plan - Warfarin per pharmacy  Seizures [facility]) Assessment & Plan - Resumed home phenytoin 50 mg nightly and 100 mg nightly - Phenobarbital 60 mg nightly - Ativan 2 mg IV as needed  for seizures, 3 doses ordered with instructions to administer medication as needed and let provider know - Seizure precautions  Low grade B-cell lymphoma ([place]) Assessment & Plan - Continue follow-up with outpatient oncology clinic  * Cellulitis Assessment & Plan - Vancomycin per pharmacy - Patient had skin abscess/culture tested positive for MRSA on 01/18/2021 - Wound care has been consulted - Images uploaded in media  Chart reviewed.   DVT prophylaxis: Warfarin Code Status: Full code Diet: Heart healthy Family Communication: No Disposition Plan: Pending clinical course Consults called: Wound Admission status: Telemetry medical, observation  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   History of lymphocytosis    Impetigo    History of   Liver masses    History of, Benign   Meningioma (HCC)    Mild mental retardation    Monoclonal gammopathy    low level   Personal history of pulmonary embolism    Seizures (HCC)    Thyroid mass    history of   Past Surgical History:  Procedure Laterality Date   NO PAST SURGERIES     Social History:  reports that she has never smoked. She has never used smokeless tobacco. She reports that she does not drink alcohol and does not use drugs.  Allergies  Allergen Reactions   Dalbavancin Rash    Widespread rash with no systemic symptoms   Family History  Problem Relation Age of Onset   Hypertension Other    Arthritis Other    Breast cancer Neg Hx    Family history: Family history reviewed and not pertinent.  Prior to Admission medications   Medication Sig Start Date End Date Taking? Authorizing Provider  bisoprolol-hydrochlorothiazide (ZIAC) 2.5-6.25 MG tablet Take 2 tablets by mouth daily. 09/04/18  Yes [provider]  Emollient (CETAPHIL) cream  04/07/14  Yes [provider]  ferrous  sulfate 325 (65 FE) MG EC tablet Take 1 tablet (325 mg total) by mouth daily with breakfast. With orange juice 12/09/14  Yes Corcoran, Drue Second, MD  folic acid (FOLVITE) 1 MG tablet TAKE ONE TABLET BY MOUTH EACH DAY. 07/12/16  Yes [provider]  mupirocin ointment (BACTROBAN) 2 % Discontinue all previous orders for Mupirocin and apply to aa's as directed. 02/03/21  Yes Tsosie Billing, MD  PHENObarbital (LUMINAL) 60 MG tablet Take 60 mg by mouth at bedtime. 10/11/20  Yes [provider]  phenytoin (DILANTIN) 100 MG ER capsule TAKE 2 CAPSULES BY MOUTH AT BEDTIME (SEIZURE DISORDER) 11/19/13  Yes [provider]  phenytoin (DILANTIN) 50 MG tablet Chew 50 mg by mouth at bedtime.   Yes [provider]  warfarin (COUMADIN) 1 MG tablet Take 2-3 mg by mouth daily at 4 PM. Takes 3 mg along with a 6 mg tablet on mon, weds, fri Takes 2 mg along with a 6 mg tablet on tues. Thurs, sat and sun 02/11/14  Yes [provider]  warfarin (COUMADIN) 6 MG tablet Take 6 mg by mouth daily at 4 PM. 08/25/20  Yes [provider]  chlorhexidine (HIBICLENS) 4 % external liquid Wash from  the neck down excluding the vagina as directed while showering. Do not get in the eyes or ears. Patient not taking: Reported on 02/26/2021 02/03/21   Tsosie Billing, MD  hydrocortisone 2.5 % cream Apply twice daily to abdominal area for two weeks. Apply 2nd Patient not taking: Reported on 02/26/2021 01/18/21   Laurence Ferrari, [place], MD  ketoconazole (NIZORAL) 2 % cream Apply twice daily to abdominal area for 2 weeks. Apply 1st Patient not taking: Reported on 02/26/2021 01/18/21   Laurence Ferrari, [place], MD  linezolid (ZYVOX) 600 MG tablet Take 1 tablet (600 mg total) by mouth 2 (two) times daily. Patient not taking: Reported on 02/26/2021 02/03/21   Tsosie Billing, MD  rifampin (RIFADIN) 300 MG capsule Take 1 capsule (300 mg total) by mouth 2 (two) times daily. Patient not taking: Reported on  02/26/2021 02/03/21   Tsosie Billing, MD  triamcinolone ointment (KENALOG) 0.1 % APPLY TWICE DAILY TO RASH ON BODY. AVOIDING FACE, GROIN, AND UNDERARMS. DO NOT MIX  THIS WITH CERAVE CREAM. Patient not taking: Reported on 02/26/2021 12/09/20   Alfonso Patten, MD   Physical Exam: Vitals:   02/26/21 1930 02/26/21 2030 02/26/21 2300 02/26/21 2330  BP: (!) 160/87 (!) 143/109 112/61 (!) 142/82  Pulse: 85 87 82 95  Resp:  16    Temp:      TempSrc:      SpO2: 99% 99% 100% 100%  Weight:      Height:       Constitutional: appears older than chronological age, NAD, calm, comfortable Eyes: PERRL, lids and conjunctivae normal ENMT: Mucous membranes are moist. Posterior pharynx clear of any exudate or lesions. Poor dentition. Hearing appropriate Neck: normal, supple, no masses, no thyromegaly Respiratory: clear to auscultation bilaterally, no wheezing, no crackles. Normal respiratory effort. No accessory muscle use.  Cardiovascular: Regular rate and rhythm, no murmurs / rubs / gallops. No extremity edema. 2+ pedal pulses. No carotid bruits.  Abdomen: Obese abdomen, no tenderness, no masses palpated, no hepatosplenomegaly. Bowel sounds positive.  Musculoskeletal: no clubbing / cyanosis. No joint deformity upper and lower extremities. Good ROM, no contractures, no atrophy. Normal muscle tone.  Skin: + rashes, lesions, ulcers.           Neurologic: Sensation intact. Strength 5/5 in all 4.  Psychiatric: Normal judgment and insight. Alert and oriented x 3. Normal mood.   EKG: Not indicated at this time  Chest x-ray on Admission: Not indicated at this time  US Venous Img Lower Unilateral Right  Result Date: 02/26/2021 CLINICAL DATA:  Right lower extremity swelling EXAM: RIGHT LOWER EXTREMITY VENOUS DOPPLER ULTRASOUND TECHNIQUE: Gray-scale sonography with compression, as well as color and duplex ultrasound, were performed to evaluate the deep venous system(s) from the level of the common  femoral vein through the popliteal and proximal calf veins. COMPARISON:  None. FINDINGS: VENOUS Normal compressibility of the common femoral, superficial femoral, and popliteal veins, as well as the visualized calf veins. Visualized portions of profunda femoral vein and great saphenous vein unremarkable. No filling defects to suggest DVT on grayscale or color Doppler imaging. Doppler waveforms show normal direction of venous flow, normal respiratory plasticity and response to augmentation. Limited views of the contralateral common femoral vein are unremarkable. OTHER None. Limitations: none IMPRESSION: Negative. Electronically Signed   By: Rolm Baptise M.D.   On: 02/26/2021 20:38    Labs on Admission: I have personally reviewed following labs  CBC: Recent Labs  Lab 02/26/21 1810  WBC 11.0*  NEUTROABS 7.7  HGB 11.0*  HCT 37.3  MCV 71.9*  PLT 387   Basic Metabolic Panel: Recent Labs  Lab 02/26/21 1810  NA 140  K 4.4  CL 104  CO2 27  GLUCOSE 103*  BUN 16  CREATININE 0.71  CALCIUM 8.6*   GFR: Estimated Creatinine Clearance: 88.6 mL/min (by C-G formula based on SCr of 0.71 mg/dL).  Liver Function Tests: Recent Labs  Lab 02/26/21 1810  AST 23  ALT 15  ALKPHOS 73  BILITOT 0.5  PROT 6.3*  ALBUMIN 3.3*   Urine analysis:    Component Value Date/Time   COLORURINE YELLOW (A) 08/22/2017 0315   APPEARANCEUR HAZY (A) 08/22/2017 0315   APPEARANCEUR Clear 11/23/2011 1948   LABSPEC 1.008 08/22/2017 0315   LABSPEC 1.008 11/23/2011 1948   PHURINE 6.0 08/22/2017 0315   GLUCOSEU NEGATIVE 08/22/2017 0315   GLUCOSEU Negative 11/23/2011 1948   HGBUR SMALL (A) 08/22/2017 0315   BILIRUBINUR NEGATIVE 08/22/2017 0315   BILIRUBINUR Negative 11/23/2011 1948   KETONESUR NEGATIVE 08/22/2017 0315   PROTEINUR NEGATIVE 08/22/2017 0315   NITRITE POSITIVE (A) 08/22/2017 0315   LEUKOCYTESUR MODERATE (A) 08/22/2017 0315   LEUKOCYTESUR 2+ 11/23/2011 1948   Dr. Tobie Poet [facility]  If  7PM-7AM, please contact overnight-coverage provider If 7AM-7PM, please contact day coverage provider [email or web address]  02/27/2021, 2:12 AM

## 2021-02-26 NOTE — ED Triage Notes (Signed)
Pt via POV from [place]. Pt c/o R leg pain and swelling. Wound noted to the L leg and bilateral arms, pt has a hx of DVT and pt is on Warfarin. Pt was recently started on a new soap. Pt is A&OX4 and NAD.

## 2021-02-26 NOTE — ED Notes (Signed)
Delay with IV meds as patient has 1 ultrasound guided 22 G IV in her left AC.  Pt has cognition issues and is unable to understand the importance of holding her arm straight for the IV anbx to infuse.  Will continue to reinforce this periodically and infuse anbx as I can.

## 2021-02-27 DIAGNOSIS — Z8614 Personal history of Methicillin resistant Staphylococcus aureus infection: Secondary | ICD-10-CM | POA: Diagnosis not present

## 2021-02-27 DIAGNOSIS — Z79899 Other long term (current) drug therapy: Secondary | ICD-10-CM | POA: Diagnosis not present

## 2021-02-27 DIAGNOSIS — Z86711 Personal history of pulmonary embolism: Secondary | ICD-10-CM | POA: Diagnosis not present

## 2021-02-27 DIAGNOSIS — Z20822 Contact with and (suspected) exposure to covid-19: Secondary | ICD-10-CM | POA: Diagnosis present

## 2021-02-27 DIAGNOSIS — Z86718 Personal history of other venous thrombosis and embolism: Secondary | ICD-10-CM | POA: Diagnosis not present

## 2021-02-27 DIAGNOSIS — L089 Local infection of the skin and subcutaneous tissue, unspecified: Secondary | ICD-10-CM | POA: Diagnosis not present

## 2021-02-27 DIAGNOSIS — Z7901 Long term (current) use of anticoagulants: Secondary | ICD-10-CM | POA: Diagnosis not present

## 2021-02-27 DIAGNOSIS — F70 Mild intellectual disabilities: Secondary | ICD-10-CM | POA: Diagnosis present

## 2021-02-27 DIAGNOSIS — C8510 Unspecified B-cell lymphoma, unspecified site: Secondary | ICD-10-CM | POA: Diagnosis present

## 2021-02-27 DIAGNOSIS — L03115 Cellulitis of right lower limb: Secondary | ICD-10-CM | POA: Diagnosis present

## 2021-02-27 DIAGNOSIS — I10 Essential (primary) hypertension: Secondary | ICD-10-CM | POA: Diagnosis present

## 2021-02-27 DIAGNOSIS — Z8249 Family history of ischemic heart disease and other diseases of the circulatory system: Secondary | ICD-10-CM | POA: Diagnosis not present

## 2021-02-27 DIAGNOSIS — G4089 Other seizures: Secondary | ICD-10-CM | POA: Diagnosis present

## 2021-02-27 DIAGNOSIS — D509 Iron deficiency anemia, unspecified: Secondary | ICD-10-CM | POA: Diagnosis present

## 2021-02-27 LAB — APTT: aPTT: 32 seconds (ref 24–36)

## 2021-02-27 LAB — PROTIME-INR
INR: 2.4 — ABNORMAL HIGH (ref 0.8–1.2)
Prothrombin Time: 26.3 seconds — ABNORMAL HIGH (ref 11.4–15.2)

## 2021-02-27 LAB — HIV ANTIBODY (ROUTINE TESTING W REFLEX): HIV Screen 4th Generation wRfx: NONREACTIVE

## 2021-02-27 MED ORDER — WARFARIN SODIUM 4 MG PO TABS
8.0000 mg | ORAL_TABLET | Freq: Once | ORAL | Status: AC
  Administered 2021-02-27: 8 mg via ORAL
  Filled 2021-02-27 (×2): qty 2

## 2021-02-27 MED ORDER — FERROUS SULFATE 325 (65 FE) MG PO TABS
325.0000 mg | ORAL_TABLET | Freq: Every day | ORAL | Status: DC
  Administered 2021-02-27 – 2021-03-01 (×3): 325 mg via ORAL
  Filled 2021-02-27 (×3): qty 1

## 2021-02-27 MED ORDER — LORAZEPAM 2 MG/ML IJ SOLN
2.0000 mg | INTRAMUSCULAR | Status: DC | PRN
Start: 2021-02-27 — End: 2021-03-01

## 2021-02-27 MED ORDER — VANCOMYCIN HCL 1250 MG/250ML IV SOLN
1250.0000 mg | Freq: Two times a day (BID) | INTRAVENOUS | Status: DC
  Administered 2021-02-27 – 2021-03-01 (×4): 1250 mg via INTRAVENOUS
  Filled 2021-02-27 (×5): qty 250

## 2021-02-27 MED ORDER — BISOPROLOL-HYDROCHLOROTHIAZIDE 2.5-6.25 MG PO TABS
2.0000 | ORAL_TABLET | Freq: Every day | ORAL | Status: DC
  Administered 2021-02-27 – 2021-03-01 (×3): 2 via ORAL
  Filled 2021-02-27 (×3): qty 2

## 2021-02-27 MED ORDER — PHENYTOIN SODIUM EXTENDED 100 MG PO CAPS
100.0000 mg | ORAL_CAPSULE | Freq: Every day | ORAL | Status: DC
  Administered 2021-02-27 – 2021-02-28 (×3): 100 mg via ORAL
  Filled 2021-02-27 (×4): qty 1

## 2021-02-27 MED ORDER — SODIUM CHLORIDE 0.9 % IV SOLN: 2.0000 g | INTRAVENOUS | Status: DC

## 2021-02-27 MED ORDER — FOLIC ACID 1 MG PO TABS
1.0000 mg | ORAL_TABLET | Freq: Every day | ORAL | Status: DC
  Administered 2021-02-27 – 2021-03-01 (×3): 1 mg via ORAL
  Filled 2021-02-27 (×3): qty 1

## 2021-02-27 MED ORDER — PHENOBARBITAL 20 MG/5ML PO ELIX
60.0000 mg | ORAL_SOLUTION | Freq: Every day | ORAL | Status: DC
  Administered 2021-02-27 – 2021-02-28 (×3): 60 mg via ORAL
  Filled 2021-02-27 (×5): qty 15

## 2021-02-27 MED ORDER — WARFARIN - PHARMACIST DOSING INPATIENT
Freq: Every day | Status: DC
  Filled 2021-02-27: qty 1

## 2021-02-27 NOTE — Assessment & Plan Note (Signed)
-   Resumed home phenytoin 50 mg nightly and 100 mg nightly - Phenobarbital 60 mg nightly - Ativan 2 mg IV as needed for seizures, 3 doses ordered with instructions to administer medication as needed and let provider know - Seizure precautions

## 2021-02-27 NOTE — Assessment & Plan Note (Signed)
-   Continue follow-up with outpatient oncology clinic

## 2021-02-27 NOTE — Assessment & Plan Note (Addendum)
The patient presented to the ED with a complaint of right lower extremity swelling and pain. Doppler was negative for DVT in Rt lower extremity. In the ED she was also noted to have rashes and lesion on her upper extremities bilaterally. The patient states that the rashes are due to her using the wrong soap. She has a history of skin infection due to MRSA. This has been treated recently with a 10 day course of linezolid and rifampin.   The patient has been started with vancomycin in the ED. MRSA screen is pending.   I have discussed the patient with Dr. Juleen China of infectious disease. He agrees that this does appear to be more of a dermatological condition than cellulitis alone. He does recommend continuation of linezolid upon discharge for another week. Will also recommend that the patient have close follow up with dermatology as outpatient.  However, the skin looks a little better today than it did yesterday. Given that I will continue vancomycin for another 24 hours.   Anticipate discharge to home tomorrow.

## 2021-02-27 NOTE — Progress Notes (Signed)
ANTICOAGULATION CONSULT NOTE - Initial Consult  Pharmacy Consult for Warfarin Indication: pulmonary embolus/DVT  Allergies  Allergen Reactions   Dalbavancin Rash    Widespread rash with no systemic symptoms    Patient Measurements: Height: 5\' 7"  (170.2 cm) Weight: 97.5 kg (215 lb) IBW/kg (Calculated) : 61.6 Heparin Dosing Weight:    Vital Signs: BP: 139/75 (02/26 0800) Pulse Rate: 99 (02/26 0800)  Labs: Recent Labs    02/26/21 1810 02/27/21 0243  HGB 11.0*  --   HCT 37.3  --   PLT 283  --   APTT  --  32  LABPROT  --  26.3*  INR  --  2.4*  CREATININE 0.71  --     Estimated Creatinine Clearance: 88.6 mL/min (by C-G formula based on SCr of 0.71 mg/dL).   Medical History: 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   History of lymphocytosis    Impetigo    History of   Liver masses    History of, Benign   Meningioma (HCC)    Mild mental retardation    Monoclonal gammopathy    low level   Personal history of pulmonary embolism    Seizures (HCC)    Thyroid mass    history of    Medications:  (Not in a hospital admission)  Scheduled:   bisoprolol-hydrochlorothiazide  2 tablet Oral Q breakfast   ferrous sulfate  325 mg Oral Q breakfast   folic acid  1 mg Oral Q breakfast   PHENObarbital  60 mg Oral QHS   phenytoin  50 mg Oral QHS   phenytoin  100 mg Oral QHS   Infusions:   vancomycin      Assessment: 61 yo F on Warfarin Home regimen: Warfarin 9 mg Mon,Wed,Fri and 8 mg Tues,Thurs,Sat,Sun Last dose:2/25 @ 1700   2/26 INR 2.4   DDI: Phenobarb, phenytoin, vancomycin   Goal of Therapy:  INR 2-3 Monitor platelets by anticoagulation protocol: Yes   Plan:  Will order Warfarin 8 mg x 1 dose tonight. F/u INR daily and CBC per protocol.  Geanie Pacifico A 02/27/2021,10:13 AM

## 2021-02-27 NOTE — Plan of Care (Signed)

## 2021-02-27 NOTE — Assessment & Plan Note (Signed)
The patient is on bisoprolol-hydrochlorothiazine 2.5-6.25 mg daily. She is normotensive on this.

## 2021-02-27 NOTE — Assessment & Plan Note (Addendum)
-   History of recurrent MRSA skin infection, follows with ID and dermatology - Recheck MRSA PCR - Treated with linezolid and rifampin for 10-day course per ID note on 02/03/2021 - Continue with vancomycin at this time pending MRSA PCR - A.m. team to consider ID consultation for further assistance and evaluation in the a.m.

## 2021-02-27 NOTE — Assessment & Plan Note (Signed)
-   Vancomycin per pharmacy - Patient had skin abscess/culture tested positive for MRSA on 01/18/2021 - Wound care has been consulted - Images uploaded in media

## 2021-02-27 NOTE — ED Notes (Signed)
The administrator of the facility came by to see patient just now. She reported that her arm looks worse today and spreading rapidly. She reports she was here most of the night and it is spreading up her arm and to neck. She also states it is now on her chest. MD Swayze, Ava sent message to make aware

## 2021-02-27 NOTE — Progress Notes (Signed)
PROGRESS NOTE  Laurie Rice [name] DOB: 10/10/1960 DOA: 02/26/2021 PCP: Juluis Pitch, MD  Brief History   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   Vitals in the emergency department show temperature of 98, respiration rate of 17, heart rate 84, blood pressure 147/86, SPO2 of 93% on room air.  Serum sodium 140, potassium 4.4, chloride 104, bicarb 27, BUN of 16, serum creatinine of 0.71, nonfasting blood glucose 103, WBC 11, hemoglobin 11, platelets of 283.  Lactic acid was 1.2.   ED treatment: Cefepime, metronidazole, vancomycin, sodium chloride 1 L bolus.   Right lower extremity ultrasound was ordered and negative for DVT.   At bedside patient is able to tell me her name, her age, her current location.  She states that she lives in a home she states that she was brought to the hospital because of swelling in her legs concerning for wounds.  She also has bilateral arm rashes and lesions.  She states that she was showering by herself and she is not supposed to and she was supposed to have help, and she used the wrong soap.  The patient has been admitted as inpatient. She is receiving IV vancomycin. Will discuss the patient with oncology and possibly dermatology tomorrow. This does not look like a typical cellulitis.  Consultants  None  Procedures  None  Antibiotics   Anti-infectives (From admission, onward)    Start     Dose/Rate Route Frequency Ordered Stop   02/27/21 1200  vancomycin (VANCOREADY) IVPB 1250 mg/250 mL        1,250 mg 166.7 mL/hr over 90 Minutes Intravenous Every 12 hours 02/27/21 0246     02/27/21 0145  cefTRIAXone (ROCEPHIN) 2 g in sodium  chloride 0.9 % 100 mL IVPB  Status:  Discontinued        2 g 200 mL/hr over 30 Minutes Intravenous Every 24 hours 02/27/21 0139 02/27/21 0139   02/26/21 2030  vancomycin (VANCOREADY) IVPB 2000 mg/400 mL        2,000 mg 200 mL/hr over 120 Minutes Intravenous  Once 02/26/21 1949 02/27/21 0517   02/26/21 1945  ceFEPIme (MAXIPIME) 2 g in sodium chloride 0.9 % 100 mL IVPB        2 g 200 mL/hr over 30 Minutes Intravenous  Once 02/26/21 1939 02/27/21 0058   02/26/21 1945  metroNIDAZOLE (FLAGYL) IVPB 500 mg  Status:  Discontinued        500 mg 100 mL/hr over 60 Minutes Intravenous  Once 02/26/21 1939 02/27/21 0209   02/26/21 1945  vancomycin (VANCOCIN) IVPB 1000 mg/200 mL premix  Status:  Discontinued        1,000 mg 200 mL/hr over 60 Minutes Intravenous  Once 02/26/21 1939 02/26/21 1949      Subjective  The patient is resting comfortably. No new complaints.  Objective   Vitals:  Vitals:   02/27/21 1230 02/27/21 1520  BP: 113/62 103/79  Pulse: 76 70  Resp: 18 18  Temp:  98.1 F (36.7 C)  SpO2: 100% 100%   Exam:  Constitutional:  The patient is awake, alert, and oriented x 3. No acute distress. Respiratory:  No increased work of breathing. No wheezes, rales, or rhonchi No tactile fremitus Cardiovascular:  Regular  rate and rhythm No murmurs, ectopy, or gallups. No lateral PMI. No thrills. Abdomen:  Abdomen is soft, non-tender, non-distended No hernias, masses, or organomegaly Normoactive bowel sounds.  Musculoskeletal:  No cyanosis or clubbinr Right upper and right lower extremities are swollen. Skin:  On physical exam this does not appear to be a typical celllulitis. It is a scaly rash with separation between "scales" that is pink and raw appearing. 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 I plan to speak to her oncologist about this tomorrow. May also need assistance from dermatology. Neurologic:  CN 2-12 intact Sensation  all 4 extremities intact Psychiatric:  Mental status Mood, affect appropriate Orientation to person, place, time  judgment and insight appear intact   I have personally reviewed the following:   Today's Data  Vitals  Lab Data  CBC CMP  Micro Data  Blood culture x 2 has had no growth  Imaging    Cardiology Data    Other Data    Scheduled Meds:  bisoprolol-hydrochlorothiazide  2 tablet Oral Q breakfast   ferrous sulfate  325 mg Oral Q breakfast   folic acid  1 mg Oral Q breakfast   PHENObarbital  60 mg Oral QHS   phenytoin  50 mg Oral QHS   phenytoin  100 mg Oral QHS   warfarin  8 mg Oral ONCE-1600   Warfarin - Pharmacist Dosing Inpatient   Does not apply q1600   Continuous Infusions:  vancomycin Stopped (02/27/21 1527)    Principal Problem:   Cellulitis Active Problems:   Meningioma (HCC)   Microcytic anemia   Low grade B-cell lymphoma (HCC)   Seizures (HCC)   Essential hypertension   History of pulmonary embolus (PE)   Infection of skin due to methicillin resistant Staphylococcus aureus (MRSA)   LOS: 0 days   A & P  Assessment and Plan: * Cellulitis- (present on admission) The patient presented to the ED with a complaint of right lower extremity swelling and pain. Doppler was negative for DVT in Rt lower extremity. In the ED she was also noted to have rashes and lesion on her upper extremities bilaterally. The patient states that the rashes are due to her using the wrong soap. She has a history of skin infection due to MRSA. This has been treated recently with a 10 day course of linezolid and rifampin.   The patient has been started with vancomycin in the ED. MRSA screen is pending.   On physical exam this does not appear to be a typical celllulitis. It is a scaly rash with separation between "scales" that is pink and raw appearing. 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 I plan to speak to her oncologist  about this tomorrow. May also need assistance from dermatology.  Also under consideration is warfarin toxicity.  Infection of skin due to methicillin resistant Staphylococcus aureus (MRSA)- (present on admission) The patient presented to the ED with a complaint of right lower extremity swelling and pain. Doppler was negative for DVT in Rt lower extremity. In the ED she was also noted to have rashes and lesion on her upper extremities bilaterally. The patient states that the rashes are due to her using the wrong soap. She has a history of skin infection due to MRSA. This has been treated recently with a 10 day course of linezolid and rifampin.   The patient has been started with vancomycin in  the ED. MRSA screen is pending.   On physical exam this does not appear to be a typical celllulitis. It is a scaly rash with separation between "scales" that is pink and raw appearing. 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 I plan to speak to her oncologist about this tomorrow. May also need assistance from dermatology.    History of pulmonary embolus (PE)- (present on admission) Noted. Pharmacy is managing warfarin.   Essential hypertension- (present on admission) The patient is on bisoprolol-hydrochlorothiazine 2.5-6.25 mg daily. She is normotensive on this.   I have seen and examined this patient myself. I have spent 34 minutes in her evaluation and care.  DVT prophylaxis: Warfarin Code Status: Full Code Family Communication: None available Disposition Plan: return to group home    Earlin Sweeden, DO [facility] Direct contact: see [email or web address]  7PM-7AM contact night coverage as above 02/27/2021, 4:46 PM  LOS: 0 days

## 2021-02-27 NOTE — Assessment & Plan Note (Addendum)
The patient presented to the ED with a complaint of right lower extremity swelling and pain. Doppler was negative for DVT in Rt lower extremity. In the ED she was also noted to have rashes and lesion on her upper extremities bilaterally. The patient states that the rashes are due to her using the wrong soap. She has a history of skin infection due to MRSA. This has been treated recently with a 10 day course of linezolid and rifampin.   The patient has been started with vancomycin in the ED. MRSA screen is pending.   On physical exam this does not appear to be a typical celllulitis. It is a scaly rash with separation between "scales" that is pink and raw appearing. 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 Juleen China of infectious disease. He agrees that this does appear to be more of a dermatological condition than cellulitis alone. He does recommend continuation of linezolid upon discharge for another week. Will also recommend that the patient have close follow up with dermatology as outpatient.  However, the skin looks a little better today than it did yesterday. Given that I will continue vancomycin for another 24 hours.

## 2021-02-27 NOTE — ED Notes (Signed)
Patient eating lunch tray at this time °

## 2021-02-27 NOTE — Assessment & Plan Note (Signed)
Noted. Pharmacy is managing warfarin.

## 2021-02-27 NOTE — ED Notes (Signed)
Patient eating breakfast at this time.

## 2021-02-27 NOTE — Progress Notes (Signed)
Pharmacy Antibiotic Note  Laurie Rice is a 61 y.o. female admitted on 02/26/2021 with cellulitis with h/o MRSA on 01/18/21.  Pharmacy has been consulted for Vancomycin dosing.  Plan: Pt given initial dose of Vancomycin 2 gm. Vancomycin 1250 mg IV Q 12 hrs.  Goal AUC 400-550. Expected AUC: 497.7 SCr used: 0.71  Pharmacy will continue to follow and adjust abx dosing whenever warranted.   Height: 5\' 7"  (170.2 cm) Weight: 97.5 kg (215 lb) IBW/kg (Calculated) : 61.6  Temp (24hrs), Avg:98 F (36.7 C), Min:98 F (36.7 C), Max:98 F (36.7 C)  Recent Labs  Lab 02/26/21 1810 02/26/21 2010  WBC 11.0*  --   CREATININE 0.71  --   LATICACIDVEN  --  1.2    Estimated Creatinine Clearance: 88.6 mL/min (by C-G formula based on SCr of 0.71 mg/dL).    Allergies  Allergen Reactions   Dalbavancin Rash    Widespread rash with no systemic symptoms    Antimicrobials this admission: 2/25 Cefepime >> x 1 dose 2/26 Flagyl >> x 1 dose 2/26 Vancomycin >>  Microbiology results: 2/25 BCx: Pending  Thank you for allowing pharmacy to be a part of this patients care.  Renda Rolls, PharmD, MBA 02/27/2021 2:26 AM

## 2021-02-27 NOTE — ED Provider Notes (Signed)
[facility] Provider Note    Event Date/Time   First MD Initiated Contact with Patient 02/26/21 1911     (approximate)   History   Wound Check   HPI  Laurie Rice is a 61 y.o. female with a history of DVT, intellectual disability, B-cell lymphoma who is sent to the ED from family care home due to right leg pain and swelling.  Patient reports she first noticed this today.  She is on warfarin due to history of DVT and she reports that she is compliant with this.  Denies any trauma.  No fever.     Physical Exam   Triage Vital Signs: ED Triage Vitals  Enc Vitals Group     BP 02/26/21 1807 118/64     Pulse Rate 02/26/21 1807 81     Resp 02/26/21 1807 18     Temp 02/26/21 1807 98 F (36.7 C)     Temp Source 02/26/21 1807 Oral     SpO2 02/26/21 1807 100 %     Weight 02/26/21 1809 215 lb (97.5 kg)     Height 02/26/21 1809 5\' 7"  (1.702 m)     Head Circumference --      Peak Flow --      Pain Score 02/26/21 1809 0     Pain Loc --      Pain Edu? --      Excl. in [place]? --     Most recent vital signs: Vitals:   02/26/21 1930 02/26/21 2030  BP: (!) 160/87 (!) 143/109  Pulse: 85 87  Resp:  16  Temp:    SpO2: 99% 99%     General: Awake, no distress.  CV:  Good peripheral perfusion.  Regular rate and rhythm Resp:  Normal effort.  Clear to auscultation bilaterally Abd:  No distention.  Soft and nontender Other:  Extremity exam reveals normal-appearing left lower extremity.  Right lower extremity is markedly edematous with breakdown of the skin over the right lower leg with honey crusting weepy appearance.  This area is warm and tender to the touch.  No crepitus or purulent drainage.  No fluctuance.  No lacerations. There is additional edema and skin breakdown on the right forearm, but no crusting.  There is clear serous drainage from this area, more likely chronic lymphedema related   ED Results / Procedures / Treatments   Labs (all labs  ordered are listed, but only abnormal results are displayed) Labs Reviewed  COMPREHENSIVE METABOLIC PANEL - Abnormal; Notable for the following components:      Result Value   Glucose, Bld 103 (*)    Calcium 8.6 (*)    Total Protein 6.3 (*)    Albumin 3.3 (*)    All other components within normal limits  CBC WITH DIFFERENTIAL/PLATELET - Abnormal; Notable for the following components:   WBC 11.0 (*)    RBC 5.19 (*)    Hemoglobin 11.0 (*)    MCV 71.9 (*)    MCH 21.2 (*)    MCHC 29.5 (*)    RDW 18.0 (*)    All other components within normal limits  RESP PANEL BY RT-PCR (FLU A&B, COVID) ARPGX2  CULTURE, BLOOD (ROUTINE X 2)  CULTURE, BLOOD (ROUTINE X 2)  LACTIC ACID, PLASMA  PROTIME-INR  APTT  HIV ANTIBODY (ROUTINE TESTING W REFLEX)     EKG     RADIOLOGY Ultrasound right lower extremity viewed interpreted by me, no DVT.  Radiology report  reviewed    PROCEDURES:  Critical Care performed: No  Procedures   MEDICATIONS ORDERED IN ED: Medications  metroNIDAZOLE (FLAGYL) IVPB 500 mg (has no administration in time range)  vancomycin (VANCOREADY) IVPB 2000 mg/400 mL (has no administration in time range)  phenytoin (DILANTIN) chewable tablet 50 mg (has no administration in time range)  enoxaparin (LOVENOX) injection 50 mg (has no administration in time range)  sodium chloride 0.9 % bolus 1,000 mL (1,000 mLs Intravenous New Bag/Given 02/26/21 2049)  ceFEPIme (MAXIPIME) 2 g in sodium chloride 0.9 % 100 mL IVPB (2 g Intravenous New Bag/Given 02/26/21 2049)     IMPRESSION / MDM / ASSESSMENT AND PLAN / ED COURSE  I reviewed the triage vital signs and the nursing notes.                              Differential diagnosis includes, but is not limited to, cellulitis, right leg DVT.  Vitals and labs are unremarkable.  Ultrasound is negative for DVT.  Patient is not septic, but with extensive soft tissue infection of the right lower extremity, she warrants admission with IV  antibiotics to ensure improvement.         FINAL CLINICAL IMPRESSION(S) / ED DIAGNOSES   Final diagnoses:  Cellulitis of right leg     Rx / DC Orders   ED Discharge Orders     None        Note:  This document was prepared using Dragon voice recognition software and may include unintentional dictation errors.   Carrie Mew, MD 02/27/21 [phone]

## 2021-02-27 NOTE — Assessment & Plan Note (Signed)
-   Resumed home bisoprolol-hydrochlorothiazide 2-6.25 mg tablet, 2 tablet daily

## 2021-02-27 NOTE — Assessment & Plan Note (Signed)
-   Warfarin per pharmacy

## 2021-02-27 NOTE — Progress Notes (Signed)
PHARMACIST - PHYSICIAN COMMUNICATION  CONCERNING:  Enoxaparin (Lovenox) for DVT Prophylaxis    RECOMMENDATION: Patient was prescribed enoxaprin 40mg  q24 hours for VTE prophylaxis.   Filed Weights   02/26/21 1809  Weight: 97.5 kg (215 lb)    Body mass index is 33.67 kg/m.  Estimated Creatinine Clearance: 88.6 mL/min (by C-G formula based on SCr of 0.71 mg/dL).   Based on [place] patient is candidate for enoxaparin 0.5mg /kg TBW SQ every 24 hours based on BMI being >30.  DESCRIPTION: Pharmacy has adjusted enoxaparin dose per [facility] policy.  Patient is now receiving enoxaparin 0.5 mg/kg every 24 hours   Renda Rolls, PharmD, [facility] 02/27/2021 12:11 AM

## 2021-02-28 LAB — CBC WITH DIFFERENTIAL/PLATELET
Abs Immature Granulocytes: 0.03 10*3/uL (ref 0.00–0.07)
Basophils Absolute: 0 10*3/uL (ref 0.0–0.1)
Basophils Relative: 0 %
Eosinophils Absolute: 0.3 10*3/uL (ref 0.0–0.5)
Eosinophils Relative: 3 %
HCT: 32.7 % — ABNORMAL LOW (ref 36.0–46.0)
Hemoglobin: 9.9 g/dL — ABNORMAL LOW (ref 12.0–15.0)
Immature Granulocytes: 0 %
Lymphocytes Relative: 30 %
Lymphs Abs: 2.7 10*3/uL (ref 0.7–4.0)
MCH: 21.2 pg — ABNORMAL LOW (ref 26.0–34.0)
MCHC: 30.3 g/dL (ref 30.0–36.0)
MCV: 69.9 fL — ABNORMAL LOW (ref 80.0–100.0)
Monocytes Absolute: 0.7 10*3/uL (ref 0.1–1.0)
Monocytes Relative: 8 %
Neutro Abs: 5.2 10*3/uL (ref 1.7–7.7)
Neutrophils Relative %: 59 %
Platelets: 211 10*3/uL (ref 150–400)
RBC: 4.68 MIL/uL (ref 3.87–5.11)
RDW: 17.2 % — ABNORMAL HIGH (ref 11.5–15.5)
Smear Review: NORMAL
WBC: 9 10*3/uL (ref 4.0–10.5)
nRBC: 0 % (ref 0.0–0.2)

## 2021-02-28 LAB — PROTIME-INR
INR: 2.3 — ABNORMAL HIGH (ref 0.8–1.2)
Prothrombin Time: 24.9 seconds — ABNORMAL HIGH (ref 11.4–15.2)

## 2021-02-28 LAB — BASIC METABOLIC PANEL
Anion gap: 8 (ref 5–15)
BUN: 10 mg/dL (ref 8–23)
CO2: 27 mmol/L (ref 22–32)
Calcium: 7.7 mg/dL — ABNORMAL LOW (ref 8.9–10.3)
Chloride: 107 mmol/L (ref 98–111)
Creatinine, Ser: 0.61 mg/dL (ref 0.44–1.00)
GFR, Estimated: >60 mL/min (ref >60)
Glucose, Bld: 81 mg/dL (ref 70–99)
Potassium: 4.1 mmol/L (ref 3.5–5.1)
Sodium: 142 mmol/L (ref 135–145)

## 2021-02-28 LAB — MRSA NEXT GEN BY PCR, NASAL: MRSA by PCR Next Gen: NOT DETECTED

## 2021-02-28 MED ORDER — CALCIUM CARBONATE 1250 (500 CA) MG PO TABS
1000.0000 mg | ORAL_TABLET | Freq: Once | ORAL | Status: AC
  Administered 2021-02-28: 1000 mg via ORAL
  Filled 2021-02-28: qty 1

## 2021-02-28 MED ORDER — WARFARIN SODIUM 6 MG PO TABS
9.0000 mg | ORAL_TABLET | Freq: Once | ORAL | Status: AC
  Administered 2021-02-28: 9 mg via ORAL
  Filled 2021-02-28: qty 1

## 2021-02-28 MED ORDER — SODIUM CHLORIDE 0.9 % IV SOLN: INTRAVENOUS | Status: DC | PRN

## 2021-02-28 NOTE — Assessment & Plan Note (Signed)
Noted. Monitor hemoglobin. Transfuse for hemoglobin less than 7.0.

## 2021-02-28 NOTE — TOC Initial Note (Addendum)
Transition of Care [facility]) - Initial/Assessment Note    Patient Details  Name: NOURA PURPURA MRN: [id number] Date of Birth: 1960-06-01  [facility]) CM/SW Contact:    Beverly Sessions, RN Phone Number: 02/28/2021, 4:25 PM  Clinical Narrative:                    Admitted OHY:[id number]  Admitted from: [facility] PCP: [place]: [place]  Current home health/prior home health/DME: RW  Spoke with [name] patient's cousin who is her legal Guardian.  He states patient has lived at the group home for 10 years.  Plan is for patient to return at discharge.  Agreeable to home health RN states he does not have a preference of home health agency.   Spoke with Linna Caprice [phone] - admin at [facility] . She confirms that patient can return at discharge. She states "before she comes back though I would like to have some answers to what is going on because I know it's going to happen again" Joslin agreeable to home health, states she does not have a preference of home health agency.  States that patient can ambulate with a RW.  Group home will provide transport at discharge   Will need updated fl2 at discharge      Patient Goals and CMS Choice        Expected Discharge Plan and Services                                                Prior Living Arrangements/Services                       Activities of Daily Living Home Assistive Devices/Equipment: Gilford Rile (specify type) ADL Screening (condition at time of admission) Patient's cognitive ability adequate to safely complete daily activities?: Yes Is the patient deaf or have difficulty hearing?: No Does the patient have difficulty seeing, even when wearing glasses/contacts?: No Does the patient have difficulty concentrating, remembering, or making decisions?: No Patient able to express need for assistance with ADLs?: Yes Does the patient have difficulty  dressing or bathing?: No Independently performs ADLs?: Yes (appropriate for developmental age) Does the patient have difficulty walking or climbing stairs?: Yes Weakness of Legs: Both Weakness of Arms/Hands: Both  Permission Sought/Granted                  Emotional Assessment              Admission diagnosis:  Cellulitis [L03.90] Cellulitis of right leg [L03.115] Patient Active Problem List   Diagnosis Date Noted   Essential hypertension 02/27/2021   History of pulmonary embolus (PE) 02/27/2021   Infection of skin due to methicillin resistant Staphylococcus aureus (MRSA) 02/27/2021   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 [place] [address] Phone: [phone] Fax: [place], [place] -  [place] [address] Phone: [phone] Fax: [phone]     Social Determinants of Health (SDOH) Interventions    Readmission Risk Interventions No flowsheet data found.

## 2021-02-28 NOTE — Progress Notes (Signed)
Pharmacy Antibiotic Note  Laurie Rice is a 61 y.o. female admitted on 02/26/2021 with cellulitis with h/o MRSA on 01/18/21.  Pharmacy has been consulted for Vancomycin dosing.  Plan: Change vancomycin 1250 mg IV q12h to 1500 mg IV q24h Est AUC: 480 Used: Scr 0.8 (rounded up), IBW, Vd 0.5 Obtain vanc levels around 4th or 5th dose if continued Pharmacy will continue to follow and adjust abx dosing whenever warranted.   Height: 5\' 7"  (170.2 cm) Weight: 97.5 kg (215 lb) IBW/kg (Calculated) : 61.6  Temp (24hrs), Avg:98.2 F (36.8 C), Min:97.9 F (36.6 C), Max:98.7 F (37.1 C)  Recent Labs  Lab 02/26/21 1810 02/26/21 2010  WBC 11.0*  --   CREATININE 0.71  --   LATICACIDVEN  --  1.2     Estimated Creatinine Clearance: 88.6 mL/min (by C-G formula based on SCr of 0.71 mg/dL).    Allergies  Allergen Reactions   Dalbavancin Rash    Widespread rash with no systemic symptoms    Antimicrobials this admission: 2/25 Cefepime >> x 1 dose 2/26 Flagyl >> x 1 dose 2/26 Vancomycin >>  Microbiology results: 2/25 BCx: NGTD  Thank you for allowing pharmacy to be a part of this patients care.  Sherilyn Banker, PharmD Clinical Pharmacist  02/28/2021 7:30 AM

## 2021-02-28 NOTE — Assessment & Plan Note (Signed)
Noted continue seizure medications as at home.

## 2021-02-28 NOTE — Progress Notes (Signed)
ANTICOAGULATION CONSULT NOTE - Initial Consult  Pharmacy Consult for Warfarin Indication: pulmonary embolus/DVT  Allergies  Allergen Reactions   Dalbavancin Rash    Widespread rash with no systemic symptoms    Patient Measurements: Height: 5\' 7"  (170.2 cm) Weight: 97.5 kg (215 lb) IBW/kg (Calculated) : 61.6 Heparin Dosing Weight:    Vital Signs: Temp: 98.7 F (37.1 C) (02/27 0439) Temp Source: Oral (02/27 0439) BP: 131/66 (02/27 0439) Pulse Rate: 86 (02/27 0439)  Labs: Recent Labs    02/26/21 1810 02/27/21 0243 02/28/21 0636  HGB 11.0*  --   --   HCT 37.3  --   --   PLT 283  --   --   APTT  --  32  --   LABPROT  --  26.3* 24.9*  INR  --  2.4* 2.3*  CREATININE 0.71  --   --      Estimated Creatinine Clearance: 88.6 mL/min (by C-G formula based on SCr of 0.71 mg/dL).   Medical History: 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   History of lymphocytosis    Impetigo    History of   Liver masses    History of, Benign   Meningioma (HCC)    Mild mental retardation    Monoclonal gammopathy    low level   Personal history of pulmonary embolism    Seizures (HCC)    Thyroid mass    history of    Medications:  Medications Prior to Admission  Medication Sig Dispense Refill Last Dose   bisoprolol-hydrochlorothiazide (ZIAC) 2.5-6.25 MG tablet Take 2 tablets by mouth daily.   02/26/2021 at 0800   Emollient (CETAPHIL) cream    02/26/2021 at 0800   ferrous sulfate 325 (65 FE) MG EC tablet Take 1 tablet (325 mg total) by mouth daily with breakfast. With orange juice 30 tablet 3 08/11/1749 at 0258   folic acid (FOLVITE) 1 MG tablet TAKE ONE TABLET BY MOUTH EACH DAY.   02/26/2021 at 0800   mupirocin ointment (BACTROBAN) 2 % Discontinue all previous orders for Mupirocin and apply to aa's as directed. 22 g 3 Past Week   PHENObarbital (LUMINAL)  60 MG tablet Take 60 mg by mouth at bedtime.   02/25/2021 at 2000   phenytoin (DILANTIN) 100 MG ER capsule TAKE 2 CAPSULES BY MOUTH AT BEDTIME (SEIZURE DISORDER)   02/25/2021 at 2000   phenytoin (DILANTIN) 50 MG tablet Chew 50 mg by mouth at bedtime.   02/25/2021 at 2000   warfarin (COUMADIN) 1 MG tablet Take 2-3 mg by mouth daily at 4 PM. Takes 3 mg along with a 6 mg tablet on mon, weds, fri Takes 2 mg along with a 6 mg tablet on tues. Thurs, sat and sun   02/26/2021 at 1700   warfarin (COUMADIN) 6 MG tablet Take 6 mg by mouth daily at 4 PM.   02/26/2021 at 1700   chlorhexidine (HIBICLENS) 4 % external liquid Wash from the neck down excluding the vagina as directed while showering. Do not get in the eyes or ears. (Patient not taking: Reported on 02/26/2021) 120 mL 2 Not Taking   hydrocortisone 2.5 % cream Apply twice daily to abdominal area for two weeks. Apply 2nd (Patient not taking: Reported on 02/26/2021) 60 g 3 Not Taking   ketoconazole (NIZORAL) 2 %  cream Apply twice daily to abdominal area for 2 weeks. Apply 1st (Patient not taking: Reported on 02/26/2021) 60 g 2 Not Taking   linezolid (ZYVOX) 600 MG tablet Take 1 tablet (600 mg total) by mouth 2 (two) times daily. (Patient not taking: Reported on 02/26/2021) 20 tablet 0 Not Taking   rifampin (RIFADIN) 300 MG capsule Take 1 capsule (300 mg total) by mouth 2 (two) times daily. (Patient not taking: Reported on 02/26/2021) 20 capsule 0 Not Taking   triamcinolone ointment (KENALOG) 0.1 % APPLY TWICE DAILY TO RASH ON BODY. AVOIDING FACE, GROIN, AND UNDERARMS. DO NOT MIX  THIS WITH CERAVE CREAM. 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 Pharmacy consulted for warfarin dosing.   Home regimen: Warfarin 9 mg Mon,Wed,Fri and 8 mg Tues,Thurs,Sat,Sun Last dose: 2/25 @ 1700   2/26 INR 2.4 Warfarin 8 mg 2/27 INR 2.4    DDI: Phenobarb, phenytoin, vancomycin   Goal of Therapy:  INR 2-3 Monitor platelets by anticoagulation protocol: Yes   Plan:  INR therapeutic. Will continue with patient home regimen and order warfarin 9 mg tonight. Check INR daily and CBC per protocol.  Kishon Garriga O Atleigh Gruen 02/28/2021,7:30 AM

## 2021-02-28 NOTE — Assessment & Plan Note (Signed)
Noted. Patient will follow up with oncology as outpatient. She has a rituximab treatments x 4.

## 2021-02-28 NOTE — Progress Notes (Signed)
PROGRESS NOTE  Laurie Rice GQQ:[id number] DOB: 1960-02-03 DOA: 02/26/2021 PCP: Juluis Pitch, MD  Brief History   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   Vitals in the emergency department show temperature of 98, respiration rate of 17, heart rate 84, blood pressure 147/86, SPO2 of 93% on room air.  Serum sodium 140, potassium 4.4, chloride 104, bicarb 27, BUN of 16, serum creatinine of 0.71, nonfasting blood glucose 103, WBC 11, hemoglobin 11, platelets of 283.  Lactic acid was 1.2.   ED treatment: Cefepime, metronidazole, vancomycin, sodium chloride 1 L bolus.   Right lower extremity ultrasound was ordered and negative for DVT.   At bedside patient is able to tell me Laurie Rice name, Laurie Rice age, Laurie Rice current location.  She states that she lives in a home she states that she was brought to the hospital because of swelling in Laurie Rice legs concerning for wounds.  She also has bilateral arm rashes and lesions.  She states that she was showering by herself and she is not supposed to and she was supposed to have help, and she used the wrong soap.  The patient has been admitted as inpatient. She is receiving IV vancomycin.   I have discussed the patient with Dr. Juleen China of infectious disease. He agrees that this does appear to be more of a dermatological condition than cellulitis alone. He does recommend continuation of linezolid upon discharge for another week. Will also recommend that the patient have close follow up with dermatology as outpatient.  However, the skin looks a little better today than it did yesterday. Given that I will continue vancomycin for another 24 hours.   Consultants   Infectious disease  Procedures  None  Antibiotics   Anti-infectives (From admission, onward)    Start     Dose/Rate Route Frequency Ordered Stop   02/27/21 1200  vancomycin (VANCOREADY) IVPB 1250 mg/250 mL        1,250 mg 166.7 mL/hr over 90 Minutes Intravenous Every 12 hours 02/27/21 0246     02/27/21 0145  cefTRIAXone (ROCEPHIN) 2 g in sodium chloride 0.9 % 100 mL IVPB  Status:  Discontinued        2 g 200 mL/hr over 30 Minutes Intravenous Every 24 hours 02/27/21 0139 02/27/21 0139   02/26/21 2030  vancomycin (VANCOREADY) IVPB 2000 mg/400 mL        2,000 mg 200 mL/hr over 120 Minutes Intravenous  Once 02/26/21 1949 02/27/21 0517   02/26/21 1945  ceFEPIme (MAXIPIME) 2 g in sodium chloride 0.9 % 100 mL IVPB        2 g 200 mL/hr over 30 Minutes Intravenous  Once 02/26/21 1939 02/27/21 0058   02/26/21 1945  metroNIDAZOLE (FLAGYL) IVPB 500 mg  Status:  Discontinued        500 mg 100 mL/hr over 60 Minutes Intravenous  Once 02/26/21 1939 02/27/21 0209   02/26/21 1945  vancomycin (VANCOCIN) IVPB 1000 mg/200 mL premix  Status:  Discontinued        1,000 mg 200 mL/hr over 60 Minutes Intravenous  Once 02/26/21 1939 02/26/21 1949      Subjective  The patient is resting comfortably. No new complaints.  Objective   Vitals:  Vitals:   02/28/21 0439  02/28/21 0745  BP: 131/66 (!) 146/72  Pulse: 86 77  Resp: 17 16  Temp: 98.7 F (37.1 C) 98.1 F (36.7 C)  SpO2: 100% 100%   Exam:  Constitutional:  The patient is awake, alert, and oriented x 3. No acute distress. Respiratory:  No increased work of breathing. No wheezes, rales, or rhonchi No tactile fremitus Cardiovascular:  Regular rate and rhythm No murmurs, ectopy, or gallups. No lateral PMI. No thrills. Abdomen:  Abdomen is soft, non-tender, non-distended No hernias, masses, or organomegaly Normoactive bowel sounds.  Musculoskeletal:  No cyanosis or clubbinr Right upper and right lower extremities are  swollen. Skin:  On physical exam this does not appear to be a typical celllulitis. It is a scaly rash with separation between "scales" that is pink and raw appearing. 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 I plan to speak to Laurie Rice oncologist about this tomorrow. May also need assistance from dermatology. Neurologic:  CN 2-12 intact Sensation all 4 extremities intact Psychiatric:  Mental status Mood, affect appropriate Orientation to person, place, time  judgment and insight appear intact   I have personally reviewed the following:   Today's Data  Vitals  Lab Data  CBC CMP  Micro Data  Blood culture x 2 has had no growth  Imaging    Cardiology Data    Other Data    Scheduled Meds:  bisoprolol-hydrochlorothiazide  2 tablet Oral Q breakfast   ferrous sulfate  325 mg Oral Q breakfast   folic acid  1 mg Oral Q breakfast   PHENObarbital  60 mg Oral QHS   phenytoin  50 mg Oral QHS   phenytoin  100 mg Oral QHS   Warfarin - Pharmacist Dosing Inpatient   Does not apply q1600   Continuous Infusions:  sodium chloride Stopped (02/28/21 0003)   vancomycin 1,250 mg (02/28/21 1311)    Principal Problem:   Cellulitis Active Problems:   Meningioma (HCC)   Microcytic anemia   Low grade B-cell lymphoma (HCC)   Seizures (HCC)   Essential hypertension   History of pulmonary embolus (PE)   Infection of skin due to methicillin resistant Staphylococcus aureus (MRSA)   LOS: 1 day   A & P  Assessment and Plan: * Cellulitis- (present on admission) The patient presented to the ED with a complaint of right lower extremity swelling and pain. Doppler was negative for DVT in Rt lower extremity. In the ED she was also noted to have rashes and lesion on Laurie Rice upper extremities bilaterally. The patient states that the rashes are due to Laurie Rice using the wrong soap. She has a history of skin infection due to MRSA. This has been treated recently with  a 10 day course of linezolid and rifampin.   The patient has been started with vancomycin in the ED. MRSA screen is pending.   I have discussed the patient with Dr. Juleen China of infectious disease. He agrees that this does appear to be more of a dermatological condition than cellulitis alone. He does recommend continuation of linezolid upon discharge for another week. Will also recommend that the patient have close follow up with dermatology as outpatient.  However, the skin looks a little better today than it did yesterday. Given that I will continue vancomycin for another 24 hours.   Anticipate discharge to home tomorrow.  Infection of skin due to methicillin resistant Staphylococcus aureus (MRSA)- (present on admission) The patient presented to the ED with  a complaint of right lower extremity swelling and pain. Doppler was negative for DVT in Rt lower extremity. In the ED she was also noted to have rashes and lesion on Laurie Rice upper extremities bilaterally. The patient states that the rashes are due to Laurie Rice using the wrong soap. She has a history of skin infection due to MRSA. This has been treated recently with a 10 day course of linezolid and rifampin.   The patient has been started with vancomycin in the ED. MRSA screen is pending.   On physical exam this does not appear to be a typical celllulitis. It is a scaly rash with separation between "scales" that is pink and raw appearing. 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 Juleen China of infectious disease. He agrees that this does appear to be more of a dermatological condition than cellulitis alone. He does recommend continuation of linezolid upon discharge for another week. Will also recommend that the patient have close follow up with dermatology as outpatient.  However, the skin looks a little better today than it did yesterday. Given that I will continue vancomycin  for another 24 hours.    History of pulmonary embolus (PE)- (present on admission) Noted. Pharmacy is managing warfarin.   Essential hypertension- (present on admission) The patient is on bisoprolol-hydrochlorothiazine 2.5-6.25 mg daily. She is normotensive on this.  Seizures ([place]) Noted continue seizure medications as at home.  Low grade B-cell lymphoma ([place])- (present on admission) Noted. Patient will follow up with oncology as outpatient. She has a rituximab treatments x 4.   Microcytic anemia- (present on admission) Noted. Monitor hemoglobin. Transfuse for hemoglobin less than 7.0.   I have seen and examined this patient myself. I have spent 46 minutes in Laurie Rice evaluation and care.  DVT prophylaxis: Warfarin Code Status: Full Code Family Communication: None available Disposition Plan: return to group home    Kairie Vangieson, DO [facility] Direct contact: see [email or web address]  7PM-7AM contact night coverage as above 02/28/2021, 4:31 PM  LOS: 0 days

## 2021-02-28 NOTE — Consult Note (Signed)
WOC Nurse Consult Note: Patient receiving care in [facility]. Consult completed remotely after review of record and images. Reason for Consult: "images in media" Wound type: unclear etiology. Record indicates there was a recent use of a "wrong soap" when the patient showered.There is extensive swelling of the RLE and tests indicate it is negative for DVT. Evidently she was recently positive for MRSA infection of the skin--01/18/21. Weeping rashes on BUE, RLE. Unclear etiology.   Pressure Injury POA: Yes/No/NA Measurement: Wound bed: see photos Drainage (amount, consistency, odor) honey crusted weeping of RLE, clear serous from the right forearm. Periwound: Dressing procedure/placement/frequency:  NOTE: treatment of cellulitis is beyond the scope of practice of the [place] nurse. Please consider obtaining a Dermatology consult for this dermatologic condition.  Topical care of the bilateral upper extremities and RLE is: place Xeroform gauzes over the weeping or open areas, secure with kerlix. MOISTEN old dressings with saline or sterile water to remove.  Monitor the wound area(s) for worsening of condition such as: Signs/symptoms of infection,  Increase in size,  Development of or worsening of odor, Development of pain, or increased pain at the affected locations.  Notify the medical team if any of these develop.  Thank you for the consult. [place] nurse will not follow at this time.  Please re-consult the [place] team if needed.  Val Riles, RN, MSN, CWOCN, CNS-BC, pager [phone]

## 2021-03-01 ENCOUNTER — Encounter: Payer: Self-pay | Admitting: Oncology

## 2021-03-01 ENCOUNTER — Other Ambulatory Visit (HOSPITAL_COMMUNITY): Payer: Self-pay

## 2021-03-01 LAB — PROTIME-INR
INR: 1.9 — ABNORMAL HIGH (ref 0.8–1.2)
Prothrombin Time: 21.8 seconds — ABNORMAL HIGH (ref 11.4–15.2)

## 2021-03-01 MED ORDER — WARFARIN SODIUM 6 MG PO TABS
9.0000 mg | ORAL_TABLET | Freq: Once | ORAL | Status: DC
  Filled 2021-03-01: qty 1

## 2021-03-01 MED ORDER — WARFARIN SODIUM 4 MG PO TABS: 8.0000 mg | ORAL_TABLET | Freq: Every day | ORAL | 0 refills | Status: AC

## 2021-03-01 MED ORDER — WARFARIN SODIUM 1 MG PO TABS
1.0000 mg | ORAL_TABLET | ORAL | 0 refills | Status: AC
Start: 2021-03-02 — End: ?

## 2021-03-01 MED ORDER — LINEZOLID 600 MG PO TABS: 600.0000 mg | ORAL_TABLET | Freq: Two times a day (BID) | ORAL | 0 refills | Status: AC

## 2021-03-01 MED ORDER — WARFARIN SODIUM 1 MG PO TABS: 1.0000 mg | ORAL_TABLET | Freq: Every day | ORAL | 11 refills | Status: DC

## 2021-03-01 MED ORDER — SODIUM CHLORIDE 0.9 % IR SOLN: 200.0000 mL | Freq: Once | 0 refills | Status: AC

## 2021-03-01 MED ORDER — XEROFORM OCCLUSIVE GAUZE PATCH EX PADS: 1.0000 "application " | MEDICATED_PAD | Freq: Every day | CUTANEOUS | 0 refills | Status: DC

## 2021-03-01 MED ORDER — VANCOMYCIN HCL 1500 MG/300ML IV SOLN
1500.0000 mg | INTRAVENOUS | Status: DC
  Filled 2021-03-01: qty 300

## 2021-03-01 MED ORDER — "KERLIX BANDAGE ROLL 4.5""X9.3' MISC": 1.0000 "application " | Freq: Every day | 0 refills | Status: DC

## 2021-03-01 NOTE — Progress Notes (Signed)
ANTICOAGULATION CONSULT NOTE - Initial Consult  Pharmacy Consult for Warfarin Indication: pulmonary embolus/DVT  Allergies  Allergen Reactions   Dalbavancin Rash    Widespread rash with no systemic symptoms    Patient Measurements: Height: 5\' 7"  (170.2 cm) Weight: 97.5 kg (215 lb) IBW/kg (Calculated) : 61.6 Heparin Dosing Weight:    Vital Signs: Temp: 99.1 F (37.3 C) (02/28 0532) Temp Source: Oral (02/28 0532) BP: 127/67 (02/28 0532) Pulse Rate: 101 (02/28 0532)  Labs: Recent Labs    02/26/21 1810 02/27/21 0243 02/28/21 0636 03/01/21 0356  HGB 11.0*  --  9.9*  --   HCT 37.3  --  32.7*  --   PLT 283  --  211  --   APTT  --  32  --   --   LABPROT  --  26.3* 24.9* 21.8*  INR  --  2.4* 2.3* 1.9*  CREATININE 0.71  --  0.61  --      Estimated Creatinine Clearance: 88.6 mL/min (by C-G formula based on SCr of 0.61 mg/dL).   Medical History: 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   History of lymphocytosis    Impetigo    History of   Liver masses    History of, Benign   Meningioma (HCC)    Mild mental retardation    Monoclonal gammopathy    low level   Personal history of pulmonary embolism    Seizures (HCC)    Thyroid mass    history of    Medications:  Medications Prior to Admission  Medication Sig Dispense Refill Last Dose   bisoprolol-hydrochlorothiazide (ZIAC) 2.5-6.25 MG tablet Take 2 tablets by mouth daily.   02/26/2021 at 0800   Emollient (CETAPHIL) cream    02/26/2021 at 0800   ferrous sulfate 325 (65 FE) MG EC tablet Take 1 tablet (325 mg total) by mouth daily with breakfast. With orange juice 30 tablet 3 0/30/0923 at 3007   folic acid (FOLVITE) 1 MG tablet TAKE ONE TABLET BY MOUTH EACH DAY.   02/26/2021 at 0800   mupirocin ointment (BACTROBAN) 2 % Discontinue all previous orders for Mupirocin and apply to aa's as  directed. 22 g 3 Past Week   PHENObarbital (LUMINAL) 60 MG tablet Take 60 mg by mouth at bedtime.   02/25/2021 at 2000   phenytoin (DILANTIN) 100 MG ER capsule TAKE 2 CAPSULES BY MOUTH AT BEDTIME (SEIZURE DISORDER)   02/25/2021 at 2000   phenytoin (DILANTIN) 50 MG tablet Chew 50 mg by mouth at bedtime.   02/25/2021 at 2000   warfarin (COUMADIN) 1 MG tablet Take 2-3 mg by mouth daily at 4 PM. Takes 3 mg along with a 6 mg tablet on mon, weds, fri Takes 2 mg along with a 6 mg tablet on tues. Thurs, sat and sun   02/26/2021 at 1700   warfarin (COUMADIN) 6 MG tablet Take 6 mg by mouth daily at 4 PM.   02/26/2021 at 1700   chlorhexidine (HIBICLENS) 4 % external liquid Wash from the neck down excluding the vagina as directed while showering. Do not get in the eyes or ears. (Patient not taking: Reported on 02/26/2021) 120 mL 2 Not Taking   hydrocortisone 2.5 % cream Apply twice daily to abdominal area for two weeks. Apply 2nd (Patient not taking: Reported on  02/26/2021) 60 g 3 Not Taking   ketoconazole (NIZORAL) 2 % cream Apply twice daily to abdominal area for 2 weeks. Apply 1st (Patient not taking: Reported on 02/26/2021) 60 g 2 Not Taking   linezolid (ZYVOX) 600 MG tablet Take 1 tablet (600 mg total) by mouth 2 (two) times daily. (Patient not taking: Reported on 02/26/2021) 20 tablet 0 Not Taking   rifampin (RIFADIN) 300 MG capsule Take 1 capsule (300 mg total) by mouth 2 (two) times daily. (Patient not taking: Reported on 02/26/2021) 20 capsule 0 Not Taking   triamcinolone ointment (KENALOG) 0.1 % APPLY TWICE DAILY TO RASH ON BODY. AVOIDING FACE, GROIN, AND UNDERARMS. DO NOT MIX  THIS WITH CERAVE CREAM. 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 Pharmacy consulted for warfarin dosing.   Home regimen: Warfarin 9 mg Mon,Wed,Fri and 8 mg Tues,Thurs,Sat,Sun Last dose: 2/25 @ 1700   2/26 INR 2.4 Warfarin 8 mg 2/27 INR 2.3 Warfarin 9 mg 2/28 INR 1.9   DDI: Phenobarb, phenytoin, vancomycin   Goal of Therapy:  INR 2-3 Monitor platelets by anticoagulation protocol: Yes   Plan:  INR slightly subtherapeutic. Will order warfarin 9 mg again tonight. Check INR daily and CBC per protocol.  Skyler Dusing O Lelar Farewell 03/01/2021,7:41 AM

## 2021-03-01 NOTE — Discharge Summary (Signed)
Physician Discharge Summary   Patient: Laurie Rice MRN: [id number] DOB: 01/23/1960  Admit date:     02/26/2021  Discharge date: 03/01/21  Discharge Physician: Karie Kirks   PCP: Juluis Pitch, MD   Recommendations at discharge:    Discharge to group home Follow up with PCP in 7-10 days Follow up with Dr. Laurence Ferrari. Her office will be calling to set an appointment later this week. If not possible, please keep appointment scheduled for next Wednesday.  Discharge Diagnoses: Principal Problem:   Cellulitis Active Problems:   Meningioma (HCC)   Microcytic anemia   Low grade B-cell lymphoma (HCC)   Seizures (HCC)   Essential hypertension   History of pulmonary embolus (PE)   Infection of skin due to methicillin resistant Staphylococcus aureus (MRSA) Excema - Severe Resolved Problems:   * No resolved hospital problems. 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   Vitals in the emergency department show temperature of 98, respiration rate of 17, heart rate 84, blood pressure 147/86, SPO2 of 93% on room air.  Serum sodium 140, potassium 4.4, chloride 104, bicarb 27, BUN of 16, serum creatinine of 0.71, nonfasting blood glucose 103, WBC 11, hemoglobin 11, platelets of 283.  Lactic acid was 1.2.   ED treatment: Cefepime, metronidazole, vancomycin, sodium chloride 1 L bolus.   Right lower extremity ultrasound was ordered and negative for DVT.   At bedside patient is able to tell me her name, her age, her current location.  She states that she lives in a home she states that she was brought to the hospital because of swelling in her legs concerning for  wounds.  She also has bilateral arm rashes and lesions.  She states that she was showering by herself and she is not supposed to and she was supposed to have help, and she used the wrong soap.   The patient has been admitted as inpatient. She is receiving IV vancomycin.    I have discussed the patient with Dr. Juleen China of infectious disease. He agrees that this does appear to be more of a dermatological condition than cellulitis alone. He does recommend continuation of linezolid upon discharge for another week.   I have also discussed the patient with Dr. Laurence Ferrari. She agrees that this presentation is much more likely related to her excema than active celllulitis. Her office will be contacting the patient to set up an appointment for later this week. If this isn't possible, the patient does have an appointment scheduled for Wednesday of next week.  Assessment and Plan: * Cellulitis- (present on admission) The patient presented to the ED with a complaint of right lower extremity swelling and pain. Doppler was negative for DVT in Rt lower extremity. In the ED she was also noted to have rashes and lesion on her upper extremities bilaterally. The patient states that the rashes are due to her using the wrong soap. She has a history of skin infection due to MRSA. This has been treated recently with a 10 day course of linezolid and rifampin.   The patient has been started with vancomycin in the ED. MRSA screen is pending.   I have discussed the patient with Dr. Juleen China of infectious disease. He agrees  that this does appear to be more of a dermatological condition than cellulitis alone. He does recommend continuation of linezolid upon discharge for another week. Will also recommend that the patient have close follow up with dermatology as outpatient.  However, the skin looks a little better today than it did yesterday. Given that I will continue vancomycin for another 24 hours.   Anticipate discharge to home  tomorrow.  Infection of skin due to methicillin resistant Staphylococcus aureus (MRSA)- (present on admission) The patient presented to the ED with a complaint of right lower extremity swelling and pain. Doppler was negative for DVT in Rt lower extremity. In the ED she was also noted to have rashes and lesion on her upper extremities bilaterally. The patient states that the rashes are due to her using the wrong soap. She has a history of skin infection due to MRSA. This has been treated recently with a 10 day course of linezolid and rifampin.   The patient has been started with vancomycin in the ED. MRSA screen is pending.   On physical exam this does not appear to be a typical celllulitis. It is a scaly rash with separation between "scales" that is pink and raw appearing. 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 Juleen China of infectious disease. He agrees that this does appear to be more of a dermatological condition than cellulitis alone. He does recommend continuation of linezolid upon discharge for another week. Will also recommend that the patient have close follow up with dermatology as outpatient.  However, the skin looks a little better today than it did yesterday. Given that I will continue vancomycin for another 24 hours.    History of pulmonary embolus (PE)- (present on admission) Noted. Pharmacy is managing warfarin.   Essential hypertension- (present on admission) The patient is on bisoprolol-hydrochlorothiazine 2.5-6.25 mg daily. She is normotensive on this.  Seizures ([place]) Noted continue seizure medications as at home.  Low grade B-cell lymphoma ([place])- (present on admission) Noted. Patient will follow up with oncology as outpatient. She has a rituximab treatments x 4.   Microcytic anemia- (present on admission) Noted. Monitor hemoglobin. Transfuse for hemoglobin less than 7.0.   Consultants:  Dermatology   Infectious disease Procedures performed: None  Disposition: Group home Diet recommendation:  Discharge Diet Orders (From admission, onward)     Start     Ordered   03/01/21 0000  Diet - low sodium heart healthy        03/01/21 0912           Cardiac diet  DISCHARGE MEDICATION: Allergies as of 03/01/2021       Reactions   Dalbavancin Rash   Widespread rash with no systemic symptoms        Medication List     STOP taking these medications    rifampin 300 MG capsule Commonly known as: RIFADIN       TAKE these medications    bisoprolol-hydrochlorothiazide 2.5-6.25 MG tablet Commonly known as: ZIAC Take 2 tablets by mouth daily.   cetaphil cream   chlorhexidine 4 % external liquid Commonly known as: HIBICLENS Wash from the neck down excluding the vagina as directed while showering. Do not get in the eyes or ears.   ferrous sulfate 325 (65 FE) MG EC tablet Take 1 tablet (325 mg total) by mouth daily with breakfast. With orange juice   folic acid 1 MG tablet Commonly known as:  FOLVITE TAKE ONE TABLET BY MOUTH EACH DAY.   hydrocortisone 2.5 % cream Apply twice daily to abdominal area for two weeks. Apply 2nd   Kerlix Bandage Roll 4.5"x9.3' Misc 1 application by Does not apply route daily.   ketoconazole 2 % cream Commonly known as: NIZORAL Apply twice daily to abdominal area for 2 weeks. Apply 1st   linezolid 600 MG tablet Commonly known as: ZYVOX Take 1 tablet (600 mg total) by mouth 2 (two) times daily for 7 days.   mupirocin ointment 2 % Commonly known as: BACTROBAN Discontinue all previous orders for Mupirocin and apply to aa's as directed.   PHENObarbital 60 MG tablet Commonly known as: LUMINAL Take 60 mg by mouth at bedtime.   phenytoin 100 MG ER capsule Commonly known as: DILANTIN TAKE 2 CAPSULES BY MOUTH AT BEDTIME (SEIZURE DISORDER)   phenytoin 50 MG tablet Commonly known as: DILANTIN Chew 50 mg by mouth at bedtime.    sodium chloride irrigation 0.9 % irrigation Irrigate with 200 mLs as directed once for 1 dose.   triamcinolone ointment 0.1 % Commonly known as: KENALOG APPLY TWICE DAILY TO RASH ON BODY. AVOIDING FACE, GROIN, AND UNDERARMS. DO NOT MIX  THIS WITH CERAVE CREAM.   warfarin 1 MG tablet Commonly known as: Coumadin Take 1 tablet (1 mg total) by mouth daily. What changed: You were already taking a medication with the same name, and this prescription was added. Make sure you understand how and when to take each.   warfarin 4 MG tablet Commonly known as: COUMADIN Take 2 tablets (8 mg total) by mouth daily at 4 PM. Take 2 tablets daily for 8 mg daily. Add (1) 1 mg tablet on MWF for a total of 9 mg on those days. What changed:  medication strength how much to take additional instructions   warfarin 1 MG tablet Commonly known as: COUMADIN Take 1 tablet (1 mg total) by mouth every Monday, Wednesday, and Friday. Take with (2) 4 mg tablets every Monday, Wednesday, and Friday for a total of 9 mg on those days. Start taking on: March 02, 2021 What changed:  how much to take when to take this additional instructions   Xeroform Occlusive Gauze Patch Pads Apply 1 application topically daily.               Discharge Care Instructions  (From admission, onward)           Start     Ordered   03/01/21 0000  Discharge wound care:       Comments: Topical care of the bilateral upper extremities and RLE is: place Xeroform gauzes over the weeping or open areas, secure with kerlix. MOISTEN old dressings with saline or sterile water to remove.   03/01/21 0912            Follow-up Information     Laurence Ferrari, [place], MD Follow up.   Specialty: Dermatology Why: They will call to try to set up an appointment for this week. If not, keep appointment next Wednesday. Contact information: [address] [place] [address] [phone]         Juluis Pitch, MD Follow up in 1  week(s).   Specialty: Family Medicine Contact information: [place] [address] [phone]                 Discharge Exam: Filed Weights   02/26/21 1809  Weight: 97.5 kg   Vitals:   02/28/21 1955 03/01/21 0532  BP: (!) 118/55 127/67  Pulse: 87 (!) 101  Resp: 20 18  Temp: 99.2 F (37.3 C) 99.1 F (37.3 C)  SpO2: 99% 98%   Exam:   Constitutional:  The patient is awake, alert, and oriented x 3. No acute distress. Respiratory:  No increased work of breathing. No wheezes, rales, or rhonchi No tactile fremitus Cardiovascular:  Regular rate and rhythm No murmurs, ectopy, or gallups. No lateral PMI. No thrills. Abdomen:  Abdomen is soft, non-tender, non-distended No hernias, masses, or organomegaly Normoactive bowel sounds.  Musculoskeletal:  No cyanosis or clubbinr Right upper and right lower extremities are swollen. Skin:  On physical exam this does not appear to be a typical celllulitis. It is a scaly rash with separation between "scales" that is pink and raw appearing. Looks more like excema than cellulitis. Improved. Neurologic:  CN 2-12 intact Sensation all 4 extremities intact Psychiatric:  Mental status Mood, affect appropriate Orientation to person, place, time  judgment and insight appear intact   Condition at discharge: fair  The results of significant diagnostics from this hospitalization (including imaging, microbiology, ancillary and laboratory) are listed below for reference.   Imaging Studies: US Venous Img Lower Unilateral Right  Result Date: 02/26/2021 CLINICAL DATA:  Right lower extremity swelling EXAM: RIGHT LOWER EXTREMITY VENOUS DOPPLER ULTRASOUND TECHNIQUE: Gray-scale sonography with compression, as well as color and duplex ultrasound, were performed to evaluate the deep venous system(s) from the level of the common femoral vein through the popliteal and proximal calf veins. COMPARISON:  None. FINDINGS: VENOUS  Normal compressibility of the common femoral, superficial femoral, and popliteal veins, as well as the visualized calf veins. Visualized portions of profunda femoral vein and great saphenous vein unremarkable. No filling defects to suggest DVT on grayscale or color Doppler imaging. Doppler waveforms show normal direction of venous flow, normal respiratory plasticity and response to augmentation. Limited views of the contralateral common femoral vein are unremarkable. OTHER None. Limitations: none IMPRESSION: Negative. Electronically Signed   By: Rolm Baptise M.D.   On: 02/26/2021 20:38   US Venous Img Lower Unilateral Right  Addendum Date: 02/09/2021   ADDENDUM REPORT: 02/09/2021 16:41 ADDENDUM: FOLLOWING SHOULD BE ADDED TO THE IMPRESSION.: ADDENDUM: FOLLOWING SHOULD BE ADDED TO THE IMPRESSION. Study done on 02/09/2021 was compared with previous examination done on 07/09/2014. Patient had signs of DVT in the right popliteal vein in the previous study. In the current study, there are intraluminal echoes in the right popliteal vein without significant distention of the vein. Findings suggest possible acute on chronic DVT in the right popliteal vein. Imaging findings were discussed with Dr. Tasia Catchings by telephone call. Electronically Signed   By: Elmer Picker M.D.   On: 02/09/2021 16:41   Result Date: 02/09/2021 CLINICAL DATA:  Edema EXAM: Right LOWER EXTREMITY VENOUS DOPPLER ULTRASOUND TECHNIQUE: Gray-scale sonography with compression, as well as color and duplex ultrasound, were performed to evaluate the deep venous system(s) from the level of the common femoral vein through the popliteal and proximal calf veins. COMPARISON:  None. FINDINGS: VENOUS Normal compressibility of the common femoral, superficial femoral veins, as well as the visualized calf veins. There is incomplete compressibility of right popliteal vein. The vein is not distended. Visualized portions of profunda femoral vein and great saphenous vein  unremarkable. No filling defects to suggest DVT on grayscale or color Doppler imaging. Doppler waveforms show normal direction of venous flow, normal respiratory plasticity and response to augmentation. Limited views of the contralateral common femoral vein are  unremarkable. OTHER None. Limitations: Examination is technically difficult due to patient's body habitus. IMPRESSION: Deep venous thrombosis is seen in the right popliteal vein which may be acute or chronic. Rest of the major deep veins in the right lower extremity appear patent. Electronically Signed: By: Elmer Picker M.D. On: 02/09/2021 15:14    Microbiology: Results for orders placed or performed during the hospital encounter of 02/26/21  Culture, blood (routine x 2)     Status: None (Preliminary result)   Collection Time: 02/26/21  8:19 PM   Specimen: BLOOD  Result Value Ref Range Status   Specimen Description BLOOD BLOOD LEFT FOREARM  Final   Special Requests   Final    BOTTLES DRAWN AEROBIC AND ANAEROBIC Blood Culture results may not be optimal due to an excessive volume of blood received in culture bottles   Culture   Final    NO GROWTH 2 DAYS Performed at [facility], [address], [place], [place] [address]    Report Status PENDING  Incomplete  Resp Panel by RT-PCR (Flu A&B, Covid) Nasopharyngeal Swab     Status: None   Collection Time: 02/26/21  9:51 PM   Specimen: Nasopharyngeal Swab; Nasopharyngeal(NP) swabs in vial transport medium  Result Value Ref Range Status   SARS Coronavirus 2 by RT PCR NEGATIVE NEGATIVE Final    Comment: (NOTE) SARS-CoV-2 target nucleic acids are NOT DETECTED.  The SARS-CoV-2 RNA is generally detectable in upper respiratory specimens during the acute phase of infection. The lowest concentration of SARS-CoV-2 viral copies this assay can detect is 138 copies/mL. A negative result does not preclude SARS-Cov-2 infection and should not be used as the sole basis for treatment  or other patient management decisions. A negative result may occur with  improper specimen collection/handling, submission of specimen other than nasopharyngeal swab, presence of viral mutation(s) within the areas targeted by this assay, and inadequate number of viral copies(<138 copies/mL). A negative result must be combined with clinical observations, patient history, and epidemiological information. The expected result is Negative.  Fact Sheet for Patients:  EntrepreneurPulse.com.au  Fact Sheet for Healthcare Providers:  IncredibleEmployment.be  This test is no t yet approved or cleared by the [place] FDA and  has been authorized for detection and/or diagnosis of SARS-CoV-2 by FDA under an Emergency Use Authorization (EUA). This EUA will remain  in effect (meaning this test can be used) for the duration of the COVID-19 declaration under Section 564(b)(1) of the Act, 21 U.S.C.section 360bbb-3(b)(1), unless the authorization is terminated  or revoked sooner.       Influenza A by PCR NEGATIVE NEGATIVE Final   Influenza B by PCR NEGATIVE NEGATIVE Final    Comment: (NOTE) The Xpert Xpress SARS-CoV-2/FLU/RSV plus assay is intended as an aid in the diagnosis of influenza from Nasopharyngeal swab specimens and should not be used as a sole basis for treatment. Nasal washings and aspirates are unacceptable for Xpert Xpress SARS-CoV-2/FLU/RSV testing.  Fact Sheet for Patients: EntrepreneurPulse.com.au  Fact Sheet for Healthcare Providers: IncredibleEmployment.be  This test is not yet approved or cleared by the [place] FDA and has been authorized for detection and/or diagnosis of SARS-CoV-2 by FDA under an Emergency Use Authorization (EUA). This EUA will remain in effect (meaning this test can be used) for the duration of the COVID-19 declaration under Section 564(b)(1) of the Act, 21 U.S.C. section  360bbb-3(b)(1), unless the authorization is terminated or revoked.  Performed at [facility], [address] [place],  [place] [address]   MRSA Next Gen by PCR, Nasal     Status: None   Collection Time: 02/28/21 11:46 AM   Specimen: Nasal Mucosa; Nasal Swab  Result Value Ref Range Status   MRSA by PCR Next Gen NOT DETECTED NOT DETECTED Final    Comment: (NOTE) The GeneXpert MRSA Assay (FDA approved for NASAL specimens only), is one component of a comprehensive MRSA colonization surveillance program. It is not intended to diagnose MRSA infection nor to guide or monitor treatment for MRSA infections. Test performance is not FDA approved in patients less than 83 years old. Performed at [facility], [address] [place], [place] [address]     Labs: CBC: Recent Labs  Lab 02/26/21 1810 02/28/21 0636  WBC 11.0* 9.0  NEUTROABS 7.7 5.2  HGB 11.0* 9.9*  HCT 37.3 32.7*  MCV 71.9* 69.9*  PLT 283 191   Basic Metabolic Panel: Recent Labs  Lab 02/26/21 1810 02/28/21 0636  NA 140 142  K 4.4 4.1  CL 104 107  CO2 27 27  GLUCOSE 103* 81  BUN 16 10  CREATININE 0.71 0.61  CALCIUM 8.6* 7.7*   Liver Function Tests: Recent Labs  Lab 02/26/21 1810  AST 23  ALT 15  ALKPHOS 73  BILITOT 0.5  PROT 6.3*  ALBUMIN 3.3*   CBG: No results for input(s): GLUCAP in the last 168 hours.  Discharge time spent: greater than 30 minutes.  Signed: Karie Kirks, DO [facility] 03/01/2021

## 2021-03-01 NOTE — NC FL2 (Signed)
[place] LEVEL OF CARE SCREENING TOOL     IDENTIFICATION  Patient Name: Laurie Rice Birthdate: [birth year] Sex: female Admission Date (Current Location): 02/26/2021  [facility] and [place] Number:  Engineering geologist and Address:         Provider Number: [id number]  Attending Physician Name and Address:  Karie Kirks, DO  Relative Name and Phone Number:       Current Level of Care: Hospital Recommended Level of Care: [facility], Other (Comment) (Group home) Prior Approval Number:    Date Approved/Denied:   PASRR Number:    Discharge Plan: Other (Comment) (Group home)    Current Diagnoses: Patient Active Problem List   Diagnosis Date Noted   Essential hypertension 02/27/2021   History of pulmonary embolus (PE) 02/27/2021   Infection of skin due to methicillin resistant Staphylococcus aureus (MRSA) 02/27/2021   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   Cellulitis 07/10/2014   DVT (deep venous thrombosis) ([place]) 07/09/2014   Cellulitis of leg, right 07/09/2014   Mild mental retardation 07/09/2014   Seizures ([place]) 07/09/2014   Subtherapeutic international normalized ratio (INR) 07/09/2014   Meningioma ([place]) 07/07/2014   Microcytic anemia 07/07/2014   Low grade B-cell lymphoma ([place]) 07/07/2014    Orientation RESPIRATION BLADDER Height & Weight        Normal Continent Weight: 97.5 kg Height:  5\' 7"  (170.2 cm)  BEHAVIORAL SYMPTOMS/MOOD NEUROLOGICAL BOWEL NUTRITION STATUS      Continent Diet (Heart Healthy)  AMBULATORY STATUS COMMUNICATION OF NEEDS Skin   Supervision Verbally Other (Comment) (Cellulitis. Topical care of the bilateral upper extremities and RLE is: place Xeroform gauzes over the weeping or open areas, secure with kerlix. MOISTEN old dressings with saline or sterile water to remove.)                       Personal Care Assistance Level of Assistance              Functional Limitations Info             SPECIAL  CARE FACTORS FREQUENCY   (Home health RN through [facility])                    Contractures Contractures Info: Not present    Additional Factors Info  Code Status, Allergies Code Status Info: Full Allergies Info: Dalbavancin           TAKE these medications     bisoprolol-hydrochlorothiazide 2.5-6.25 MG tablet Commonly known as: ZIAC Take 2 tablets by mouth daily.    cetaphil cream    chlorhexidine 4 % external liquid Commonly known as: HIBICLENS Wash from the neck down excluding the vagina as directed while showering. Do not get in the eyes or ears.    ferrous sulfate 325 (65 FE) MG EC tablet Take 1 tablet (325 mg total) by mouth daily with breakfast. With orange juice    folic acid 1 MG tablet Commonly known as: FOLVITE TAKE ONE TABLET BY MOUTH EACH DAY.    hydrocortisone 2.5 % cream Apply twice daily to abdominal area for two weeks. Apply 2nd    Kerlix Bandage Roll 4.5"x9.3' Misc 1 application by Does not apply route daily.    ketoconazole 2 % cream Commonly known as: NIZORAL Apply twice daily to abdominal area for 2 weeks. Apply 1st    linezolid 600 MG tablet Commonly known as: ZYVOX Take 1 tablet (600 mg total) by mouth 2 (two) times  daily for 7 days.    mupirocin ointment 2 % Commonly known as: BACTROBAN Discontinue all previous orders for Mupirocin and apply to aa's as directed.    PHENObarbital 60 MG tablet Commonly known as: LUMINAL Take 60 mg by mouth at bedtime.    phenytoin 100 MG ER capsule Commonly known as: DILANTIN TAKE 2 CAPSULES BY MOUTH AT BEDTIME (SEIZURE DISORDER)    phenytoin 50 MG tablet Commonly known as: DILANTIN Chew 50 mg by mouth at bedtime.    sodium chloride irrigation 0.9 % irrigation Irrigate with 200 mLs as directed once for 1 dose.    triamcinolone ointment 0.1 % Commonly known as: KENALOG APPLY TWICE DAILY TO RASH ON BODY. AVOIDING FACE, GROIN, AND UNDERARMS. DO NOT MIX  THIS WITH CERAVE CREAM.    warfarin  1 MG tablet Commonly known as: Coumadin Take 1 tablet (1 mg total) by mouth daily. What changed: You were already taking a medication with the same name, and this prescription was added. Make sure you understand how and when to take each.    warfarin 4 MG tablet Commonly known as: COUMADIN Take 2 tablets (8 mg total) by mouth daily at 4 PM. Take 2 tablets daily for 8 mg daily. Add (1) 1 mg tablet on MWF for a total of 9 mg on those days. What changed:  medication strength how much to take additional instructions    warfarin 1 MG tablet Commonly known as: COUMADIN Take 1 tablet (1 mg total) by mouth every Monday, Wednesday, and Friday. Take with (2) 4 mg tablets every Monday, Wednesday, and Friday for a total of 9 mg on those days. Start taking on: March 02, 2021 What changed:  how much to take when to take this additional instructions    Xeroform Occlusive Gauze Patch Pads Apply 1 application topically daily.    Relevant Imaging Results:  Relevant Lab Results:   Additional Information    Beverly Sessions, RN

## 2021-03-01 NOTE — TOC Benefit Eligibility Note (Signed)
Patient Teacher, English as a foreign language completed.    The patient is currently admitted and upon discharge could be taking linezolid (Zyvox) 600 mg tablets.  The current 7 day co-pay is, $1.45.   The patient is insured through [place], [place] Patient Advocate Specialist [place] Patient Advocate Team Direct Number: [phone]  Fax: [phone]

## 2021-03-01 NOTE — Plan of Care (Signed)
Uneventful PM shift. Pt able to rest on and off overnight. Similar cellulitis like skin lesion noted on the left neck, which was initially observed by AM nurse also. Pt reports no itching or pain currently. Dressing changed by AM shift RN c/d/I.  Problem: Safety: Goal: Ability to remain free from injury will improve Outcome: Progressing   Problem: Pain Managment: Goal: General experience of comfort will improve Outcome: Progressing   Problem: Skin Integrity: Goal: Risk for impaired skin integrity will decrease Outcome: Progressing   Problem: Coping: Goal: Level of anxiety will decrease Outcome: Progressing   Problem: Elimination: Goal: Will not experience complications related to bowel motility Outcome: Progressing

## 2021-03-01 NOTE — TOC Transition Note (Signed)
Transition of Care [facility]) - CM/SW Discharge Note   Patient Details  Name: Laurie Rice MRN: [id number] Date of Birth: [birth year]  [facility] CM/SW Contact:  Beverly Sessions, RN Phone Number: 03/01/2021, 9:36 AM   Clinical Narrative:     Patient will DC to: [facility] Anticipated DC date: 03/01/21  Family notified:Guardian - mr graves  Transport JJ:[facility]   Per MD patient ready for DC to . RN, patient, patient's family, and facility notified of DC. Discharge Summary and Fl2 printed for dc packet.   Home health referral made to [facility] with [place] [phone]         Patient Goals and CMS Choice        Discharge Placement                       Discharge Plan and Services                                     Social Determinants of Health (SDOH) Interventions     Readmission Risk Interventions No flowsheet data found.

## 2021-03-03 ENCOUNTER — Telehealth: Payer: Self-pay

## 2021-03-03 ENCOUNTER — Other Ambulatory Visit: Payer: Self-pay

## 2021-03-03 ENCOUNTER — Encounter: Payer: Self-pay | Admitting: Dermatology

## 2021-03-03 ENCOUNTER — Ambulatory Visit (INDEPENDENT_AMBULATORY_CARE_PROVIDER_SITE_OTHER): Payer: Medicare Other | Admitting: Dermatology

## 2021-03-03 DIAGNOSIS — A4902 Methicillin resistant Staphylococcus aureus infection, unspecified site: Secondary | ICD-10-CM | POA: Diagnosis not present

## 2021-03-03 DIAGNOSIS — L308 Other specified dermatitis: Secondary | ICD-10-CM

## 2021-03-03 DIAGNOSIS — S81801A Unspecified open wound, right lower leg, initial encounter: Secondary | ICD-10-CM

## 2021-03-03 DIAGNOSIS — R21 Rash and other nonspecific skin eruption: Secondary | ICD-10-CM

## 2021-03-03 DIAGNOSIS — T148XXA Other injury of unspecified body region, initial encounter: Secondary | ICD-10-CM

## 2021-03-03 DIAGNOSIS — S21202A Unspecified open wound of left back wall of thorax without penetration into thoracic cavity, initial encounter: Secondary | ICD-10-CM

## 2021-03-03 DIAGNOSIS — Z8614 Personal history of Methicillin resistant Staphylococcus aureus infection: Secondary | ICD-10-CM

## 2021-03-03 LAB — CULTURE, BLOOD (ROUTINE X 2): Culture: NO GROWTH

## 2021-03-03 MED ORDER — PREDNISONE 5 MG PO TABS: ORAL_TABLET | ORAL | 0 refills | Status: DC

## 2021-03-03 NOTE — Progress Notes (Signed)
? ?Follow-Up Visit ?  ?Subjective  ?Laurie Rice is a 61 y.o. female who presents for the following: Rash (Legs, arms, torso. Hx MRSA infection. Recently given Vancomycin at ED for cellulitis/MRSA, but this was after rash appeared. Treated previously with bactrim and approximately 3-4 weeks ago treated with 10 day course of linezolid and rifampin to decolonize MRSA.  Broke out suddenly on Feb 25th and rash progressed rapidly that day.  See A/P section for timeline of exposures). ? ?Caretaker with patient. History obtained from caregiver. ? ?The following portions of the chart were reviewed this encounter and updated as appropriate:  Tobacco  Allergies  Meds  Problems  Med Hx  Surg Hx  Fam Hx   ?  ? ?Review of Systems: No other skin or systemic complaints except as noted in HPI or Assessment and Plan. ? ? ?Objective  ?Well appearing patient in no apparent distress; mood and affect are within normal limits. ? ?A focused examination was performed including arms and legs. Relevant physical exam findings are noted in the Assessment and Plan. ? ?Right Upper Arm - Anterior, torso, arms, legs ?Widespread coalescing erythematous to violaceous plaques with thick scale and few erosions ? ? ? ? ? ? ? ? ? ? ? ? ? ? ? ? ?Left Upper Back, Right Lower Leg - Anterior ?Scattered erosions at left upper back, right lower leg, right arm ? ?scattered body areas ? ? ? ?Assessment & Plan  ?Rash ?Right Upper Arm - Anterior; torso, arms, legs ? ?Her cousin and healthcare POA gave permission for biopsy and prednisone treatment. ? ?ddx drug rash, psoriasis, allergic contact dermatitis >> atopic dermatitis or staph scalded skin ? ?Rash appeared on arm on 02/26/2021 and rapidly spread to other areas of the body. ? ?She was on Linezolid and rifampin from February 3rd to the 13th.  ? ?She was washed with hibiclens and had mupirocin to nares from 2/13 until 2/22. ? ?She has been on all other medications for years. ? ?In the ED after  appearance of the rash, she received Cefepime, metronidazole, vancomycin. She was transitioned to linezolid again at discharge 03/01/2021.  ? ?Of note, she did develop widespread scaly pink plaques last December after receiving Dalbavancin in ED and being washed with hibiclens. I favored a drug rash secondary to dalbavacin at the time but she was doing well and not bothered by it, so we did not biopsy. She does have a history of milder eczematous rash appearing after washing with hibiclens, which is not unexpected due to drying, but could be a rare allergic contact dermatitis. However, if this were ACD to hibiclens, it is odd for it to appear 2 days after finishing her 10 day hibiclens regimen and rapidly progress to other areas.  ? 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  ? ?Will continue linezolid for now given the rash did not appear until 12 days after finishing her previous linezolid.  ? ?Started on prednisone 2 week taper and will recheck next Tuesday in clinic.  ? ?Take prednisone by mouth daily in morning with food starting at 60 mg dosage and gradually decreasing daily dose as directed until gone for a two week taper.  Patient was given written instructions.  Potential side effects reviewed. ? ?Risks of prednisone taper discussed including mood irritability, insomnia, weight gain, stomach ulcers, increased risk of infection, increased blood sugar (  diabetes), hypertension, osteoporosis with long-term or frequent use, and rare risk of avascular necrosis of the hip.  ? ? ?Skin / nail biopsy - Right Upper Arm - Anterior ?Type of biopsy: punch   ?Informed consent: discussed and consent obtained   ?Timeout: patient name, date of birth, surgical site, and procedure verified   ?Procedure prep:  Patient was prepped and draped in usual sterile fashion ?Prep type:  Isopropyl alcohol ?Anesthesia: the  lesion was anesthetized in a standard fashion   ?Anesthetic:  1% lidocaine w/ epinephrine 1-100,000 buffered w/ 8.4% NaHCO3 ?Punch size:  4 mm ?Suture size:  4-0 ?Suture type: Prolene (polypropylene)   ?Hemostasis achieved with: suture   ?Outcome: patient tolerated procedure well   ?Post-procedure details: sterile dressing applied and wound care instructions given   ?Dressing type: bacitracin   ? ?Specimen 1 - Surgical pathology ?Differential Diagnosis:  drug rash, psoriasis, allergic contact dermatitis >> atopic dermatitis ? ?Check Margins: No ?Description: Widespread coalescing erythematous to violaceous plaques with thick scale and few erosions ? ?Related Medications ?triamcinolone ointment (KENALOG) 0.1 % ?APPLY TWICE DAILY TO RASH ON BODY. AVOIDING FACE, GROIN, AND UNDERARMS. DO NOT MIX  THIS WITH CERAVE CREAM. ? ?predniSONE (DELTASONE) 5 MG tablet ?Take as directed with breakfast ? ?Open wound (2) ?Right Lower Leg - Anterior; Left Upper Back ? ?Cleanse with puracyn daily, apply xeroform gauze and wrap with gauze to arm and leg ?For back, cleanse with puracyn daily and apply vaseline twice a day ? ? ?MRSA (methicillin resistant Staphylococcus aureus) ?scattered body areas ? ?History of recurrent MRSA infection, ? Resolving cellulitis right lower extremity ? ?Continue linezolid as directed by ID. ? ?Will avoid hibiclens given question of ACD.  ? ? ?Return for Follow Up As Scheduled, Suture Removal in 2 weeks. ? ?Over 45 minutes spent in this patient encounter, more than 50% of which was spent in counseling and coordination of care. ? ?I, Emelia Salisbury, CMA, am acting as scribe for Forest Gleason, MD. ? ?Documentation: I have reviewed the above documentation for accuracy and completeness, and I agree with the above. ? ?Forest Gleason, MD ? ? ?

## 2021-03-03 NOTE — Patient Instructions (Addendum)
Discontinue hydrocortisone. Discontinue ketoconazole. Discontinue Dakin's Discontinue triamcinolone for now. Discontinue mupirocin.   Continue linezolid as prescribed from hospital.  Start prednisone taper as below.  Continue wound care to right lower leg and right arm. Cleanse open areas with puracyn/hypochlorous spray daily and then cover with xeroform gauze followed by gauze wrap daily until follow-up.   Cleanse open areas with puracyn/hypochlorous spray daily and apply vaseline twice a day to open areas at left upper back until follow-up.   2 Week Prednisone Taper  You will be given a prescription for 100 tablets of oral Prednisone. It is very important that you take this according to the exact schedule provided below. This type of regimen for taking medication is often called a "taper", because your dosage will steadily decrease over a two week period until it is discontinued altogether.  ALWAYS take this medicine with food to prevent it from irritating your stomach. You should also take your Prednisone during morning hours.  Call the clinic at [phone] if you gain more than two pounds in one day, notice swelling anywhere on your body, have shortness of breath, black or red bowel movements, brown or red vomitus, desire to drink large amounts of fluids, a fever, or extreme weakness.   Oral Prednisone over Two Weeks  Day  Week 1  Week 2   1  12  tablets  7 tablets   2  12 tablets  6 tablets   3  11 tablets  5 tablets   4  10 tablets  4 tablets   5  10 tablets  3 tablets   6  9 tablets  2 tablets   7  8 tablets  1 tablet    Wound Care Instructions  Cleanse wound gently with soap and water once a day then pat dry with clean gauze. Apply a thing coat of Petrolatum (petroleum jelly, "Vaseline") over the wound (unless you have an allergy to this). We recommend that you use a new, sterile tube of Vaseline. Do not pick or remove scabs. Do not remove the yellow or white "healing  tissue" from the base of the wound.  Cover the wound with fresh, clean, nonstick gauze and secure with paper tape. You may use Band-Aids in place of gauze and tape if the would is small enough, but would recommend trimming much of the tape off as there is often too much. Sometimes Band-Aids can irritate the skin.  You should call the office for your biopsy report after 1 week if you have not already been contacted.  If you experience any problems, such as abnormal amounts of bleeding, swelling, significant bruising, significant pain, or evidence of infection, please call the office immediately.  FOR ADULT SURGERY PATIENTS: If you need something for pain relief you may take 1 extra strength Tylenol (acetaminophen) AND 2 Ibuprofen (200mg  each) together every 4 hours as needed for pain. (do not take these if you are allergic to them or if you have a reason you should not take them.) Typically, you may only need pain medication for 1 to 3 days.      If You Need Anything After Your Visit  If you have any questions or concerns for your doctor, please call our main line at [phone] and press option 4 to reach your doctor's medical assistant. If no one answers, please leave a voicemail as directed and we will return your call as soon as possible. Messages left after 4 pm will be answered the following business  day.   You may also send [name] a message via [place]. We typically respond to MyChart messages within 1-2 business days.  For prescription refills, please ask your pharmacy to contact our office. Our fax number is [phone].  If you have an urgent issue when the clinic is closed that cannot wait until the next business day, you can page your doctor at the number below.    Please note that while we do our best to be available for urgent issues outside of office hours, we are not available 24/7.   If you have an urgent issue and are unable to reach [name], you may choose to seek medical care at  your doctor's office, retail clinic, urgent care center, or emergency room.  If you have a medical emergency, please immediately call 911 or go to the emergency department.  Pager Numbers  - Dr. Nehemiah Massed: [phone]  - Dr. Laurence Ferrari: [phone]  - Dr. Nicole Kindred: [phone]  In the event of inclement weather, please call our main line at [phone] for an update on the status of any delays or closures.  Dermatology Medication Tips: Please keep the boxes that topical medications come in in order to help keep track of the instructions about where and how to use these. Pharmacies typically print the medication instructions only on the boxes and not directly on the medication tubes.   If your medication is too expensive, please contact our office at [phone] option 4 or send [name] a message through [place].   We are unable to tell what your co-pay for medications will be in advance as this is different depending on your insurance coverage. However, we may be able to find a substitute medication at lower cost or fill out paperwork to get insurance to cover a needed medication.   If a prior authorization is required to get your medication covered by your insurance company, please allow [name] 1-2 business days to complete this process.  Drug prices often vary depending on where the prescription is filled and some pharmacies may offer cheaper prices.  The website www.goodrx.com contains coupons for medications through different pharmacies. The prices here do not account for what the cost may be with help from insurance (it may be cheaper with your insurance), but the website can give you the price if you did not use any insurance.  - You can print the associated coupon and take it with your prescription to the pharmacy.  - You may also stop by our office during regular business hours and pick up a GoodRx coupon card.  - If you need your prescription sent electronically to a different pharmacy,  notify our office through [facility] or by phone at [phone] option 4.     Si Usted Necesita Algo Despus de Su Visita  Tambin puede enviarnos un mensaje a travs de Pharmacist, community. Por lo general respondemos a los mensajes de MyChart en el transcurso de 1 a 2 das hbiles.  Para renovar recetas, por favor pida a su farmacia que se ponga en contacto con nuestra oficina. Harland Dingwall de fax es Justin [phone].  Si tiene un asunto urgente cuando la clnica est cerrada y que no puede esperar hasta el siguiente da hbil, puede llamar/localizar a su doctor(a) al nmero que aparece a continuacin.   Por favor, tenga en cuenta que aunque hacemos todo lo posible para estar disponibles para asuntos urgentes fuera del horario de [place], no estamos disponibles las 24 horas del da, los 7 das de  la semana.   Si tiene un problema urgente y no puede comunicarse con nosotros, puede optar por buscar atencin mdica  en el consultorio de su doctor(a), en una clnica privada, en un centro de atencin urgente o en una sala de emergencias.  Si tiene Engineering geologist, por favor llame inmediatamente al 911 o vaya a la sala de emergencias.  Nmeros de bper  - Dr. Nehemiah Massed: [phone]  - Dra. Moye: [phone]  - Dra. Nicole Kindred: [phone]  En caso de inclemencias del [place], por favor llame a Johnsie Kindred principal al [phone] para una actualizacin sobre el [place] de cualquier retraso o cierre.  Consejos para la medicacin en dermatologa: Por favor, guarde las cajas en las que vienen los medicamentos de uso tpico para ayudarle a seguir las instrucciones sobre dnde y cmo usarlos. Las farmacias generalmente imprimen las instrucciones del medicamento slo en las cajas y no directamente en los tubos del [place].   Si su medicamento es muy caro, por favor, pngase en contacto con Zigmund Daniel llamando al [phone] y presione la opcin 4 o envenos un mensaje a travs de  Pharmacist, community.   No podemos decirle cul ser su copago por los medicamentos por adelantado ya que esto es diferente dependiendo de la cobertura de su seguro. Sin embargo, es posible que podamos encontrar un medicamento sustituto a Electrical engineer un formulario para que el seguro cubra el medicamento que se considera necesario.   Si se requiere una autorizacin previa para que su compaa de seguros [place] su medicamento, por favor permtanos de 1 a 2 das hbiles para completar este proceso.  Los precios de los medicamentos varan con frecuencia dependiendo del Environmental consultant de dnde se surte la receta y alguna farmacias pueden ofrecer precios ms baratos.  El sitio web www.goodrx.com tiene cupones para medicamentos de Airline pilot. Los precios aqu no tienen en cuenta lo que podra costar con la ayuda del seguro (puede ser ms barato con su seguro), pero el sitio web puede darle el precio si no utiliz Research scientist (physical sciences).  - Puede imprimir el cupn correspondiente y llevarlo con su receta a la farmacia.  - Tambin puede pasar por nuestra oficina durante el horario de atencin regular y Charity fundraiser una tarjeta de cupones de GoodRx.  - Si necesita que su receta se enve electrnicamente a una farmacia diferente, informe a nuestra oficina a travs de MyChart de [place] o por telfono llamando al [phone] y presione la opcin 4.

## 2021-03-03 NOTE — Telephone Encounter (Signed)
Spoke with patient's legal guardian, [name], and was given consent for biopsy today. ?Lurlean Horns., RMA ?

## 2021-03-05 LAB — CULTURE, BLOOD (ROUTINE X 2)
Culture: NO GROWTH
Special Requests: ADEQUATE

## 2021-03-09 ENCOUNTER — Ambulatory Visit (INDEPENDENT_AMBULATORY_CARE_PROVIDER_SITE_OTHER): Payer: Medicare Other | Admitting: Dermatology

## 2021-03-09 ENCOUNTER — Other Ambulatory Visit: Payer: Self-pay

## 2021-03-09 DIAGNOSIS — T148XXA Other injury of unspecified body region, initial encounter: Secondary | ICD-10-CM

## 2021-03-09 DIAGNOSIS — L308 Other specified dermatitis: Secondary | ICD-10-CM

## 2021-03-09 DIAGNOSIS — A4902 Methicillin resistant Staphylococcus aureus infection, unspecified site: Secondary | ICD-10-CM

## 2021-03-09 DIAGNOSIS — L309 Dermatitis, unspecified: Secondary | ICD-10-CM

## 2021-03-09 DIAGNOSIS — S81801D Unspecified open wound, right lower leg, subsequent encounter: Secondary | ICD-10-CM

## 2021-03-09 NOTE — Patient Instructions (Signed)

## 2021-03-09 NOTE — Progress Notes (Unsigned)
° °  Follow-Up Visit   Subjective  Laurie Rice is a 61 y.o. 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  Caretaker with patient  The following portions of the chart were reviewed this encounter and updated as appropriate:       Review of Systems:  No other skin or systemic complaints except as noted in HPI or Assessment and Plan.  Objective  Well appearing patient in no apparent distress; mood and affect are within normal limits.  A focused examination was performed including face,arms,legs . Relevant physical exam findings are noted in the Assessment and Plan.  trunk,extremities Scaly hyperpigmented patches on the trunk,extremities  right leg Wound improving    Assessment & Plan  Dermatitis trunk,extremities  Biopsy results discussed- Contact dermatitis more likely from Hibiclens vs Id reaction   Cont prednisone by mouth daily in morning with food as directed until gone for a two week taper. Potential side effects reviewed.   Risks of prednisone taper discussed including mood irritability, insomnia, weight gain, stomach ulcers, increased risk of infection, increased blood sugar (diabetes), hypertension, osteoporosis with long-term or frequent use, and rare risk of avascular necrosis of the hip.    Cont patch testing  to Hibiclens in the future   Open wound right leg  Healing wound  Continue cleansing daily with Puracyn   MRSA (methicillin resistant Staphylococcus aureus) scattered body areas  MRSA-Improving    History of recurrent MRSA infection, ? Resolving cellulitis right lower extremity   Encounter for Removal of Sutures - Incision site at the right arm is clean, dry and intact - Wound cleansed, sutures removed, wound cleansed and steri strips applied.  - Discussed pathology results showing SPONGIOTIC DERMATITIS   - Scars remodel  for a full year. -  patient can apply over-the-counter silicone scar cream each night to help with scar remodeling if desired. - Patient advised to call with any concerns or if they notice any new or changing lesions.    Return in about 2 weeks (around 03/23/2021) for Dermatitis, MRSA, wound.  IMarye Round, CMA, am acting as scribe for Sarina Ser, MD .

## 2021-03-15 ENCOUNTER — Encounter: Payer: Self-pay | Admitting: Dermatology

## 2021-03-23 ENCOUNTER — Ambulatory Visit (INDEPENDENT_AMBULATORY_CARE_PROVIDER_SITE_OTHER): Payer: Medicare Other | Admitting: Dermatology

## 2021-03-23 ENCOUNTER — Other Ambulatory Visit: Payer: Self-pay

## 2021-03-23 DIAGNOSIS — R21 Rash and other nonspecific skin eruption: Secondary | ICD-10-CM

## 2021-03-23 DIAGNOSIS — L308 Other specified dermatitis: Secondary | ICD-10-CM

## 2021-03-23 DIAGNOSIS — L309 Dermatitis, unspecified: Secondary | ICD-10-CM

## 2021-03-23 DIAGNOSIS — L258 Unspecified contact dermatitis due to other agents: Secondary | ICD-10-CM

## 2021-03-23 DIAGNOSIS — L259 Unspecified contact dermatitis, unspecified cause: Secondary | ICD-10-CM

## 2021-03-23 MED ORDER — TRIAMCINOLONE ACETONIDE 0.1 % EX OINT: TOPICAL_OINTMENT | CUTANEOUS | 1 refills | Status: DC

## 2021-03-23 NOTE — Patient Instructions (Addendum)
Open patch test to Hibiclens applied to right upper inner arm today. Leave on 24-48 hours. ? ?Discontinue Cetaphil cream. Start Triamcinolone ointment twice daily to affected areas - Avoid face, groin and underarms. ? ?Return to clinic in 1 week.  ? ?If You Need Anything After Your Visit ? ?If you have any questions or concerns for your doctor, please call our main line at [phone] and press option 4 to reach your doctor's medical assistant. If no one answers, please leave a voicemail as directed and we will return your call as soon as possible. Messages left after 4 pm will be answered the following business day.  ? ?You may also send [name] a message via MyChart. We typically respond to MyChart messages within 1-2 business days. ? ?For prescription refills, please ask your pharmacy to contact our office. Our fax number is [phone]. ? ?If you have an urgent issue when the clinic is closed that cannot wait until the next business day, you can page your doctor at the number below.   ? ?Please note that while we do our best to be available for urgent issues outside of office hours, we are not available 24/7.  ? ?If you have an urgent issue and are unable to reach [name], you may choose to seek medical care at your doctor's office, retail clinic, urgent care center, or emergency room. ? ?If you have a medical emergency, please immediately call 911 or go to the emergency department. ? ?Pager Numbers ? ?- Dr. Nehemiah Massed: [phone] ? ?- Dr. Laurence Ferrari: [phone] ? ?- Dr. Nicole Kindred: [phone] ? ?In the event of inclement weather, please call our main line at [phone] for an update on the status of any delays or closures. ? ?Dermatology Medication Tips: ?Please keep the boxes that topical medications come in in order to help keep track of the instructions about where and how to use these. Pharmacies typically print the medication instructions only on the boxes and not directly on the medication tubes.  ? ?If your  medication is too expensive, please contact our office at [phone] option 4 or send [name] a message through [place].  ? ?We are unable to tell what your co-pay for medications will be in advance as this is different depending on your insurance coverage. However, we may be able to find a substitute medication at lower cost or fill out paperwork to get insurance to cover a needed medication.  ? ?If a prior authorization is required to get your medication covered by your insurance company, please allow [name] 1-2 business days to complete this process. ? ?Drug prices often vary depending on where the prescription is filled and some pharmacies may offer cheaper prices. ? ?The website www.goodrx.com contains coupons for medications through different pharmacies. The prices here do not account for what the cost may be with help from insurance (it may be cheaper with your insurance), but the website can give you the price if you did not use any insurance.  ?- You can print the associated coupon and take it with your prescription to the pharmacy.  ?- You may also stop by our office during regular business hours and pick up a GoodRx coupon card.  ?- If you need your prescription sent electronically to a different pharmacy, notify our office through [facility] or by phone at [phone] option 4. ? ? ? ? ?Si Usted Necesita Algo Despu?s de Su Visita ? ?Tambi?n puede enviarnos un mensaje a trav?s de MyChart. Por lo general  respondemos a los mensajes de MyChart en el transcurso de 1 a 2 d?as h?biles. ? ?Para renovar recetas, por favor pida a su farmacia que se ponga en contacto con nuestra oficina. Nuestro n?mero de fax es el [phone]. ? ?Si tiene un asunto urgente cuando la cl?nica est? cerrada y que no puede esperar hasta el siguiente d?a h?bil, puede llamar/localizar a su doctor(a) al n?mero que aparece a continuaci?n.  ? ?Por favor, tenga en cuenta que aunque hacemos todo lo posible para estar disponibles para  asuntos urgentes fuera del horario de oficina, no estamos disponibles las 24 horas del d?a, los 7 d?as de la semana.  ? ?Si tiene un problema urgente y no puede comunicarse con nosotros, puede optar por buscar atenci?n m?dica  en el consultorio de su doctor(a), en una cl?nica privada, en un centro de atenci?n urgente o en una sala de emergencias. ? ?Si tiene Engineer, maintenance (IT) m?dica, por favor llame inmediatamente al 911 o vaya a la sala de emergencias. ? ?N?meros de b?per ? ?- Dr. Nehemiah Massed: [phone] ? ?- Dra. Moye: [phone] ? ?- Dra. Nicole Kindred: [phone] ? ?En caso de inclemencias del tiempo, por favor llame a nuestra l?nea principal al [phone] para una actualizaci?n sobre el estado de cualquier retraso o cierre. ? ?Consejos para la medicaci?n en dermatolog?a: ?Por favor, guarde las cajas en las que vienen los medicamentos de uso t?pico para ayudarle a seguir las instrucciones sobre d?nde y c?mo usarlos. Las farmacias generalmente imprimen las instrucciones del medicamento s?lo en las cajas y no directamente en los tubos del [place].  ? ?Si su medicamento es muy caro, por favor, p?ngase en contacto con Zigmund Daniel llamando al [phone] y presione la opci?n 4 o env?enos un mensaje a trav?s de MyChart.  ? ?No podemos decirle cu?l ser? su copago por los medicamentos por adelantado ya que esto es diferente dependiendo de la cobertura de su seguro. Sin embargo, es posible que podamos encontrar un medicamento sustituto a Electrical engineer un formulario para que el seguro cubra el medicamento que se considera necesario.  ? ?Si se requiere Ardelia Mems autorizaci?n previa para que su compa??a de seguros [place] su medicamento, por favor perm?tanos de 1 a 2 d?as h?biles para completar este proceso. ? ?Los precios de los medicamentos var?an con frecuencia dependiendo del Environmental consultant de d?nde se surte la receta y alguna farmacias pueden ofrecer precios m?s baratos. ? ?El sitio web www.goodrx.com tiene cupones para  medicamentos de Airline pilot. Los precios aqu? no tienen en cuenta lo que podr?a costar con la ayuda del seguro (puede ser m?s barato con su seguro), pero el sitio web puede darle el precio si no utiliz? ning?n seguro.  ?- Puede imprimir el cup?n correspondiente y llevarlo con su receta a la farmacia.  ?- Tambi?n puede pasar por nuestra oficina durante el horario de atenci?n regular y recoger una tarjeta de cupones de GoodRx.  ?- Si necesita que su receta se env?e electr?nicamente a Chiropodist, informe a nuestra oficina a trav?s de MyChart de [place] o por tel?fono llamando al [phone] y presione la opci?n 4.  ?

## 2021-03-23 NOTE — Progress Notes (Signed)
? ?  Follow-Up Visit ?  ?Subjective  ?Laurie Rice is a 61 y.o. female who presents for the following: Dermatitis (2 week follow up - seems to have more swelling today. She is using Cetaphil cream but needs something more moisturizing. She finished taking her Prednisone.). ? ?Accompanied by care giver who contributes to history. ? ?The following portions of the chart were reviewed this encounter and updated as appropriate:  ? Tobacco  Allergies  Meds  Problems  Med Hx  Surg Hx  Fam Hx   ?  ?Review of Systems:  No other skin or systemic complaints except as noted in HPI or Assessment and Plan. ? ?Objective  ?Well appearing patient in no apparent distress; mood and affect are within normal limits. ? ?A focused examination was performed including lower extremities, including the arms hands fingers legs, feet, toes, and toenails. Relevant physical exam findings are noted in the Assessment and Plan. ? ? ? ? ? ? ? ? ? ? ? ? ? ? ? ? ? ?Assessment & Plan  ?Dermatitis ? ?Biopsy proven - "spongiotic dermatitis" ?Contact dermatitis from Hibiclens (most likely) vs Atopic Dermatitis ?vs Id reaction from R leg wound (less likely) ? ?Chronic and persistent condition with duration or expected duration over one year. Condition is bothersome/symptomatic for patient. Currently flared. ? ?Patch test to Hibiclens in [place] chamber applied to right upper inner arm today. ? ?Discontinue Cetaphil.  ? ?Start TMC 0.1% ointment bid to affected areas - avoid face, groin, underarms. ? ?Will plan to start [place] on follow up if there is no evidence of contact dermatitis to Hibiclens. ? ?triamcinolone ointment (KENALOG) 0.1 % ?APPLY TWICE DAILY TO RASH ON BODY. AVOIDING FACE, GROIN, AND UNDERARMS. ? ?Return in about 1 week (around 03/30/2021) for Follow up - Start Dupixent? ? ?I, Ashok Cordia, CMA, am acting as scribe for Sarina Ser, MD . ?Documentation: I have reviewed the above documentation for accuracy and completeness, and I  agree with the above. ? ?Sarina Ser, MD ? ?

## 2021-03-27 ENCOUNTER — Encounter: Payer: Self-pay | Admitting: Dermatology

## 2021-03-30 ENCOUNTER — Ambulatory Visit (INDEPENDENT_AMBULATORY_CARE_PROVIDER_SITE_OTHER): Payer: Medicare Other | Admitting: Dermatology

## 2021-03-30 DIAGNOSIS — L2089 Other atopic dermatitis: Secondary | ICD-10-CM | POA: Diagnosis not present

## 2021-03-30 DIAGNOSIS — L2389 Allergic contact dermatitis due to other agents: Secondary | ICD-10-CM | POA: Diagnosis not present

## 2021-03-30 MED ORDER — DUPIXENT 300 MG/2ML ~~LOC~~ SOSY: 300.0000 mg | PREFILLED_SYRINGE | SUBCUTANEOUS | 3 refills | Status: DC

## 2021-03-30 MED ORDER — DUPILUMAB 300 MG/2ML ~~LOC~~ SOSY
300.0000 mg | PREFILLED_SYRINGE | Freq: Once | SUBCUTANEOUS | Status: AC
  Administered 2021-03-30: 300 mg via SUBCUTANEOUS

## 2021-03-30 NOTE — Progress Notes (Signed)
? ?  Follow-Up Visit ?  ?Subjective  ?Laurie Rice is a 61 y.o. female who presents for the following: Follow-up (Patch test to hibiclens follow up - she did get a positive reaction. Start Dupixent today). ? ?Accompanied by caregiver ? ?The following portions of the chart were reviewed this encounter and updated as appropriate:  ? Tobacco  Allergies  Meds  Problems  Med Hx  Surg Hx  Fam Hx   ?  ?Review of Systems:  No other skin or systemic complaints except as noted in HPI or Assessment and Plan. ? ?Objective  ?Well appearing patient in no apparent distress; mood and affect are within normal limits. ? ?A focused examination was performed including arms. Relevant physical exam findings are noted in the Assessment and Plan. ? ? ?Assessment & Plan  ?Atopic dermatitis -severe and persistent ?Biopsy proven - "spongiotic dermatitis" ?And ?Contact dermatitis from Hibiclens -patch test proven  ? ?chronic and persistent condition with duration or expected duration over one year. Condition is bothersome/symptomatic for patient. Currently flared. ?  ?Start Dupixent 300 mg/2 ml - loading dose given today in office Lot [id number] Exp 08/2023 ? ?dupilumab (DUPIXENT) prefilled syringe 300 mg ? ?dupilumab (DUPIXENT) 300 MG/2ML prefilled syringe ?Inject 300 mg into the skin every 14 (fourteen) days. Starting at day 15 for maintenance. ? ?Allergic contact dermatitis due to other agents ?Secondary to Hibiclens - positive reaction to patch testing ? ?Return in about 2 weeks (around 04/13/2021) for Follow up. ? ?I, Ashok Cordia, CMA, am acting as scribe for Sarina Ser, MD . ?Documentation: I have reviewed the above documentation for accuracy and completeness, and I agree with the above. ? ?Sarina Ser, MD ? ?

## 2021-03-30 NOTE — Patient Instructions (Addendum)
Discontinue Puracyn spray and xerofoam occlusive gauze patch. ? ?Continue Triacinolone 0.1% cream twice daily to affected areas of rash - Avoid face, groin, underarms. ? ?Start Dupixent 300 mg/2 ml injections every two weeks. Loading dose injections given in office today. Will submit prescription to specialty pharmacy for insurance approval. ? ?RTC 2 weeks  ? ? ?If You Need Anything After Your Visit ? ?If you have any questions or concerns for your doctor, please call our main line at [phone] and press option 4 to reach your doctor's medical assistant. If no one answers, please leave a voicemail as directed and we will return your call as soon as possible. Messages left after 4 pm will be answered the following business day.  ? ?You may also send [name] a message via MyChart. We typically respond to MyChart messages within 1-2 business days. ? ?For prescription refills, please ask your pharmacy to contact our office. Our fax number is [phone]. ? ?If you have an urgent issue when the clinic is closed that cannot wait until the next business day, you can page your doctor at the number below.   ? ?Please note that while we do our best to be available for urgent issues outside of office hours, we are not available 24/7.  ? ?If you have an urgent issue and are unable to reach [name], you may choose to seek medical care at your doctor's office, retail clinic, urgent care center, or emergency room. ? ?If you have a medical emergency, please immediately call 911 or go to the emergency department. ? ?Pager Numbers ? ?- Dr. Nehemiah Massed: [phone] ? ?- Dr. Laurence Ferrari: [phone] ? ?- Dr. Nicole Kindred: [phone] ? ?In the event of inclement weather, please call our main line at [phone] for an update on the status of any delays or closures. ? ?Dermatology Medication Tips: ?Please keep the boxes that topical medications come in in order to help keep track of the instructions about where and how to use these. Pharmacies typically  print the medication instructions only on the boxes and not directly on the medication tubes.  ? ?If your medication is too expensive, please contact our office at [phone] option 4 or send [name] a message through [place].  ? ?We are unable to tell what your co-pay for medications will be in advance as this is different depending on your insurance coverage. However, we may be able to find a substitute medication at lower cost or fill out paperwork to get insurance to cover a needed medication.  ? ?If a prior authorization is required to get your medication covered by your insurance company, please allow [name] 1-2 business days to complete this process. ? ?Drug prices often vary depending on where the prescription is filled and some pharmacies may offer cheaper prices. ? ?The website www.goodrx.com contains coupons for medications through different pharmacies. The prices here do not account for what the cost may be with help from insurance (it may be cheaper with your insurance), but the website can give you the price if you did not use any insurance.  ?- You can print the associated coupon and take it with your prescription to the pharmacy.  ?- You may also stop by our office during regular business hours and pick up a GoodRx coupon card.  ?- If you need your prescription sent electronically to a different pharmacy, notify our office through [facility] or by phone at [phone] option 4. ? ? ? ? ?Si Usted Necesita Algo Despu?s de  Su Visita ? ?Tambi?n puede enviarnos un mensaje a trav?s de MyChart. Por lo general respondemos a los mensajes de MyChart en el transcurso de 1 a 2 d?as h?biles. ? ?Para renovar recetas, por favor pida a su farmacia que se ponga en contacto con nuestra oficina. Nuestro n?mero de fax es el [phone]. ? ?Si tiene un asunto urgente cuando la cl?nica est? cerrada y que no puede esperar hasta el siguiente d?a h?bil, puede llamar/localizar a su doctor(a) al n?mero que aparece a  continuaci?n.  ? ?Por favor, tenga en cuenta que aunque hacemos todo lo posible para estar disponibles para asuntos urgentes fuera del horario de oficina, no estamos disponibles las 24 horas del d?a, los 7 d?as de la semana.  ? ?Si tiene un problema urgente y no puede comunicarse con nosotros, puede optar por buscar atenci?n m?dica  en el consultorio de su doctor(a), en una cl?nica privada, en un centro de atenci?n urgente o en una sala de emergencias. ? ?Si tiene Engineer, maintenance (IT) m?dica, por favor llame inmediatamente al 911 o vaya a la sala de emergencias. ? ?N?meros de b?per ? ?- Dr. Nehemiah Massed: [phone] ? ?- Dra. Moye: [phone] ? ?- Dra. Nicole Kindred: [phone] ? ?En caso de inclemencias del tiempo, por favor llame a nuestra l?nea principal al [phone] para una actualizaci?n sobre el estado de cualquier retraso o cierre. ? ?Consejos para la medicaci?n en dermatolog?a: ?Por favor, guarde las cajas en las que vienen los medicamentos de uso t?pico para ayudarle a seguir las instrucciones sobre d?nde y c?mo usarlos. Las farmacias generalmente imprimen las instrucciones del medicamento s?lo en las cajas y no directamente en los tubos del [place].  ? ?Si su medicamento es muy caro, por favor, p?ngase en contacto con Zigmund Daniel llamando al [phone] y presione la opci?n 4 o env?enos un mensaje a trav?s de MyChart.  ? ?No podemos decirle cu?l ser? su copago por los medicamentos por adelantado ya que esto es diferente dependiendo de la cobertura de su seguro. Sin embargo, es posible que podamos encontrar un medicamento sustituto a Electrical engineer un formulario para que el seguro cubra el medicamento que se considera necesario.  ? ?Si se requiere Ardelia Mems autorizaci?n previa para que su compa??a de seguros [place] su medicamento, por favor perm?tanos de 1 a 2 d?as h?biles para completar este proceso. ? ?Los precios de los medicamentos var?an con frecuencia dependiendo del Environmental consultant de d?nde se surte la receta  y alguna farmacias pueden ofrecer precios m?s baratos. ? ?El sitio web www.goodrx.com tiene cupones para medicamentos de Airline pilot. Los precios aqu? no tienen en cuenta lo que podr?a costar con la ayuda del seguro (puede ser m?s barato con su seguro), pero el sitio web puede darle el precio si no utiliz? ning?n seguro.  ?- Puede imprimir el cup?n correspondiente y llevarlo con su receta a la farmacia.  ?- Tambi?n puede pasar por nuestra oficina durante el horario de atenci?n regular y recoger una tarjeta de cupones de GoodRx.  ?- Si necesita que su receta se env?e electr?nicamente a Chiropodist, informe a nuestra oficina a trav?s de MyChart de [place] o por tel?fono llamando al [phone] y presione la opci?n 4.  ?

## 2021-03-31 ENCOUNTER — Encounter: Payer: Self-pay | Admitting: Dermatology

## 2021-04-04 ENCOUNTER — Telehealth: Payer: Self-pay

## 2021-04-04 NOTE — Telephone Encounter (Signed)
Patient's caregiver Elie Confer called and to cancel patient's follow up appointment tomorrow. Per Elie Confer patient is now seeing a dermatologist and does not need to see ID anymore. ?Kimmy Totten T Waneta Fitting ? ?

## 2021-04-05 ENCOUNTER — Ambulatory Visit: Payer: Medicare Other | Admitting: Infectious Diseases

## 2021-04-11 ENCOUNTER — Telehealth: Payer: Self-pay

## 2021-04-11 NOTE — Telephone Encounter (Signed)
Dupixent PA being denied due to no trail or failure of Pimecrolimus or Tacrolimus. Patient scheduled to be seen this Thursday, April 13th. aw ?

## 2021-04-14 ENCOUNTER — Ambulatory Visit: Payer: Medicare Other | Admitting: Dermatology

## 2021-04-14 ENCOUNTER — Telehealth: Payer: Self-pay

## 2021-04-14 NOTE — Telephone Encounter (Signed)
Patient's care taker called about rescheduling appointment for today. They have no transportation. Appointment rescheduled for next week. ? ?They wanted to document that she has new blisters. Unsure if this is a side effect from the [place]. Kennyth Lose tried calling their home multiple times last week to get them in this past Tuesday (04/11) but no answer. Next Thursday at 12 was the only time they could get patient back in the office.  ?

## 2021-04-21 ENCOUNTER — Ambulatory Visit (INDEPENDENT_AMBULATORY_CARE_PROVIDER_SITE_OTHER): Payer: Medicare Other | Admitting: Dermatology

## 2021-04-21 DIAGNOSIS — L98491 Non-pressure chronic ulcer of skin of other sites limited to breakdown of skin: Secondary | ICD-10-CM

## 2021-04-21 DIAGNOSIS — L2089 Other atopic dermatitis: Secondary | ICD-10-CM | POA: Diagnosis not present

## 2021-04-21 DIAGNOSIS — R21 Rash and other nonspecific skin eruption: Secondary | ICD-10-CM

## 2021-04-21 DIAGNOSIS — L97112 Non-pressure chronic ulcer of right thigh with fat layer exposed: Secondary | ICD-10-CM | POA: Diagnosis not present

## 2021-04-21 DIAGNOSIS — L97821 Non-pressure chronic ulcer of other part of left lower leg limited to breakdown of skin: Secondary | ICD-10-CM

## 2021-04-21 DIAGNOSIS — L97111 Non-pressure chronic ulcer of right thigh limited to breakdown of skin: Secondary | ICD-10-CM

## 2021-04-21 DIAGNOSIS — L98492 Non-pressure chronic ulcer of skin of other sites with fat layer exposed: Secondary | ICD-10-CM

## 2021-04-21 DIAGNOSIS — L97822 Non-pressure chronic ulcer of other part of left lower leg with fat layer exposed: Secondary | ICD-10-CM

## 2021-04-21 MED ORDER — MUPIROCIN 2 % EX OINT: 1.0000 "application " | TOPICAL_OINTMENT | Freq: Every day | CUTANEOUS | 2 refills | Status: DC

## 2021-04-21 MED ORDER — DUPILUMAB 300 MG/2ML ~~LOC~~ SOSY
300.0000 mg | PREFILLED_SYRINGE | Freq: Once | SUBCUTANEOUS | Status: AC
  Administered 2021-04-21: 300 mg via SUBCUTANEOUS

## 2021-04-21 NOTE — Patient Instructions (Addendum)
Can continue TMC 0.1% cream 1-2 times daily to affected areas as needed for itch. Avoid applying to face, groin, and axilla. Use as directed. Long-term use can cause thinning of the skin. ? ?For open areas, apply mupirocin once daily and cover with band aid as needed. ? ?Patient injected today with Dupixent 300 mg/ 39m into left upper arm.  ? ?If You Need Anything After Your Visit ? ?If you have any questions or concerns for your doctor, please call our main line at [phone] press option 4 to reach your doctor's medical assistant. If no one answers, please leave a voicemail as directed and we will return your call as soon as possible. Messages left after 4 pm will be answered the following business day.  ? ?You may also send [place] message via MyChart. We typically respond to MyChart messages within 1-2 business days. ? ?For prescription refills, please ask your pharmacy to contact our office. Our fax number is [phone] ? ?If you have an urgent issue when the clinic is closed that cannot wait until the next business day, you can page your doctor at the number below.   ? ?Please note that while we do our best to be available for urgent issues outside of office hours, we are not available 24/7.  ? ?If you have an urgent issue and are unable to reach [place] you may choose to seek medical care at your doctor's office, retail clinic, urgent care center, or emergency room. ? ?If you have a medical emergency, please immediately call 911 or go to the emergency department. ? ?Pager Numbers ? ?- Dr. KNehemiah Massed [phone]? ?- Dr. MLaurence Ferrari [phone]? ?- Dr. SNicole Kindred [phone]? ?In the event of inclement weather, please call our main line at [phone] an update on the status of any delays or closures. ? ?Dermatology Medication Tips: ?Please keep the boxes that topical medications come in in order to help keep track of the instructions about where and how to use these. Pharmacies typically print the  medication instructions only on the boxes and not directly on the medication tubes.  ? ?If your medication is too expensive, please contact our office at [phone] 4 or send [place] message through [place]  ? ?We are unable to tell what your co-pay for medications will be in advance as this is different depending on your insurance coverage. However, we may be able to find a substitute medication at lower cost or fill out paperwork to get insurance to cover a needed medication.  ? ?If a prior authorization is required to get your medication covered by your insurance company, please allow u[place]1-2 business days to complete this process. ? ?Drug prices often vary depending on where the prescription is filled and some pharmacies may offer cheaper prices. ? ?The website www.goodrx.com contains coupons for medications through different pharmacies. The prices here do not account for what the cost may be with help from insurance (it may be cheaper with your insurance), but the website can give you the price if you did not use any insurance.  ?- You can print the associated coupon and take it with your prescription to the pharmacy.  ?- You may also stop by our office during regular business hours and pick up a GoodRx coupon card.  ?- If you need your prescription sent electronically to a different pharmacy, notify our office [facility] by phone at [phone] 4. ? ? ? ? ?Si  Usted Necesita Algo Despu?s de Su Visita ? ?Tambi?n puede enviarnos un mensaje a trav?s de MyChart. Por lo general respondemos a los mensajes de MyChart en el transcurso de 1 a 2 d?as h?biles. ? ?Para renovar recetas, por favor pida a su farmacia que se ponga en contacto con nuestra oficina. Nuestro n?mero de fax es el [phone]. ? ?Si tiene un asunto urgente cuando la cl?nica est? cerrada y que no puede esperar hasta el siguiente d?a h?bil, puede llamar/localizar a su doctor(a) al n?mero que aparece a continuaci?n.   ? ?Por favor, tenga en cuenta que aunque hacemos todo lo posible para estar disponibles para asuntos urgentes fuera del horario de oficina, no estamos disponibles las 24 horas del d?a, los 7 d?as de la semana.  ? ?Si tiene un problema urgente y no puede comunicarse con nosotros, puede optar por buscar atenci?n m?dica  en el consultorio de su doctor(a), en una cl?nica privada, en un centro de atenci?n urgente o en una sala de emergencias. ? ?Si tiene Engineer, maintenance (IT) m?dica, por favor llame inmediatamente al 911 o vaya a la sala de emergencias. ? ?N?meros de b?per ? ?- Dr. Nehemiah Massed: [phone] ? ?- Dra. Moye: [phone] ? ?- Dra. Nicole Kindred: [phone] ? ?En caso de inclemencias del tiempo, por favor llame a nuestra l?nea principal al [phone] para una actualizaci?n sobre el estado de cualquier retraso o cierre. ? ?Consejos para la medicaci?n en dermatolog?a: ?Por favor, guarde las cajas en las que vienen los medicamentos de uso t?pico para ayudarle a seguir las instrucciones sobre d?nde y c?mo usarlos. Las farmacias generalmente imprimen las instrucciones del medicamento s?lo en las cajas y no directamente en los tubos del [place].  ? ?Si su medicamento es muy caro, por favor, p?ngase en contacto con Zigmund Daniel llamando al [phone] y presione la opci?n 4 o env?enos un mensaje a trav?s de MyChart.  ? ?No podemos decirle cu?l ser? su copago por los medicamentos por adelantado ya que esto es diferente dependiendo de la cobertura de su seguro. Sin embargo, es posible que podamos encontrar un medicamento sustituto a Electrical engineer un formulario para que el seguro cubra el medicamento que se considera necesario.  ? ?Si se requiere Ardelia Mems autorizaci?n previa para que su compa??a de seguros [place] su medicamento, por favor perm?tanos de 1 a 2 d?as h?biles para completar este proceso. ? ?Los precios de los medicamentos var?an con frecuencia dependiendo del Environmental consultant de d?nde se surte la receta y alguna  farmacias pueden ofrecer precios m?s baratos. ? ?El sitio web www.goodrx.com tiene cupones para medicamentos de Airline pilot. Los precios aqu? no tienen en cuenta lo que podr?a costar con la ayuda del seguro (puede ser m?s barato con su seguro), pero el sitio web puede darle el precio si no utiliz? ning?n seguro.  ?- Puede imprimir el cup?n correspondiente y llevarlo con su receta a la farmacia.  ?- Tambi?n puede pasar por nuestra oficina durante el horario de atenci?n regular y recoger una tarjeta de cupones de GoodRx.  ?- Si necesita que su receta se env?e electr?nicamente a Chiropodist, informe a nuestra oficina a trav?s de MyChart de [place] o por tel?fono llamando al [phone] y presione la opci?n 4. ? ?

## 2021-04-21 NOTE — Progress Notes (Signed)
? ?  Follow-Up Visit ?  ?Subjective  ?Laurie Rice is a 61 y.o. female who presents for the following: Follow-up (Patient here today for atopic dermatitis follow up and Dupixent injection. Patient started loading dose of Dupixent 3/29 and caregiver advises that about 1 - 1/2 weeks later patient developed bumps at mid back and one at right upper thigh but they seem to be drying up. Patient advises she has had some relief with itching. ). ? ?Patient accompanied by caregiver who contributes to history.  ? ?The following portions of the chart were reviewed this encounter and updated as appropriate:  ? Tobacco  Allergies  Meds  Problems  Med Hx  Surg Hx  Fam Hx   ?  ?Review of Systems:  No other skin or systemic complaints except as noted in HPI or Assessment and Plan. ? ?Objective  ?Well appearing patient in no apparent distress; mood and affect are within normal limits. ? ?A focused examination was performed including back, arms, legs. Relevant physical exam findings are noted in the Assessment and Plan. ? ?Chest - Medial [facility]) ?Mild scale and hyperpigmentation - much improved today  ? ?Mid Back ?Oval hyperpigmented macules with central papule ? ? ? ? ? ? ?right upper thigh, left lateral lower leg ?1.4 x 0.8 cm at right thigh ulcer ?0.8 x 0.6 cm at left lower leg ulcer ? ? ? ? ? ? ? ?Assessment & Plan  ?Other atopic dermatitis ?Chest - Medial [facility]) ? 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  ?chronic and persistent condition with duration or expected duration over one year. Condition is bothersome/symptomatic for patient. Currently flared, but still overall improved. ? ?Continue Dupixent '300mg'$ /35m, injected today to left upper arm ?Lot # [id number] Exp: 01/2023 ? ?Very complicated patient ? ?Related Medications ?dupilumab (DUPIXENT) 300 MG/2ML prefilled syringe ?Inject 300 mg into the skin every 14 (fourteen) days. Starting at day 15 for  maintenance. ? ?dupilumab (DUPIXENT) prefilled syringe 300 mg injection today ? ?Rash ?Mid Back ?/bite reaction ?See photo  ?Consider biopsy if indicated at follow up ? ?Related Medications ?predniSONE (DELTASONE) 5 MG tablet ?Take as directed with breakfast ? ?triamcinolone ointment (KENALOG) 0.1 % ?APPLY TWICE DAILY TO RASH ON BODY. AVOIDING FACE, GROIN, AND UNDERARMS. ? 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 ?mupirocin ointment (BACTROBAN) 2 % - right upper thigh, left lateral lower leg ?Apply 1 application. topically daily. Daily with dressing change ? ?Ulcer, skin, chronic, ? ?Related Medications ?mupirocin ointment (BACTROBAN) 2 % ?Discontinue all previous orders for Mupirocin and apply to aa's as directed. ? ?[place] in about 2 weeks (around [place]2021[place] ? ?IGraciella Belton RMA, am acting as scribe for [name] MD . ?Documentation: I have reviewed the above documentation for accuracy and completeness, and I agree with the above. ? ?[name] MD ? ? ?

## 2021-04-27 ENCOUNTER — Other Ambulatory Visit: Payer: Self-pay | Admitting: Dermatology

## 2021-04-27 DIAGNOSIS — R21 Rash and other nonspecific skin eruption: Secondary | ICD-10-CM

## 2021-04-30 ENCOUNTER — Encounter: Payer: Self-pay | Admitting: Dermatology

## 2021-05-04 ENCOUNTER — Ambulatory Visit (INDEPENDENT_AMBULATORY_CARE_PROVIDER_SITE_OTHER): Payer: Medicare Other | Admitting: Dermatology

## 2021-05-04 DIAGNOSIS — L2081 Atopic neurodermatitis: Secondary | ICD-10-CM

## 2021-05-04 DIAGNOSIS — L97919 Non-pressure chronic ulcer of unspecified part of right lower leg with unspecified severity: Secondary | ICD-10-CM

## 2021-05-04 NOTE — Progress Notes (Signed)
? ?  Follow-Up Visit ?  ?Subjective  ?Laurie Rice is a 61 y.o. female who presents for the following: Follow-up (2 weeks f/u atopic dermatitis, pt caretaker report skin is doing so much better on Dupxient injection every 2 weeks and using Triamcinolone cream) and Skin Problem (Recheck ulcers on right upper thigh and left thigh, treating with Mupirocin ointment with a fair response. ). ? ?Caretaker with patient contributing to history  ? ?The following portions of the chart were reviewed this encounter and updated as appropriate:  ? Tobacco  Allergies  Meds  Problems  Med Hx  Surg Hx  Fam Hx   ?  ?Review of Systems:  No other skin or systemic complaints except as noted in HPI or Assessment and Plan. ? ?Objective  ?Well appearing patient in no apparent distress; mood and affect are within normal limits. ? ?A focused examination was performed including face,chest,back,legs. Relevant physical exam findings are noted in the Assessment and Plan. ? ?trunk, exts ?Mild scale and hyperpigmentation - much improved today  ?  ? ?right anterior thigh, right thigh ?1.0 x 0.6 cm at right thigh ulcer ?Pink firm papule at right anterior thigh ?Clear at the left thigh  ? ? ? ? ? ? ? ? 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  ?chronic and persistent condition with duration or expected duration over one year. Condition is bothersome/symptomatic for patient. Currently flared, but still overall improved. ?  ?Continue Dupixent '300mg'$ /3m, injected today to right upper arm ?Lot # [id number] Exp: 01/2023 ?  ?Very complicated patient ? ?Chronic ulcer of lower extremity, right, with unspecified severity ([place] ?right anterior thigh, right thigh ?Continue Mupirocin ointment apply daily  ?Recheck at next office visit  ? ?Return in about 2 weeks (around 05/18/2021) for Dupixent injection . ? ?I, MMarye Round CMA, am  acting as scribe for [name] MD .  ?Documentation: I have reviewed the above documentation for accuracy and completeness, and I agree with the above. ? ?[name] MD ? ?

## 2021-05-04 NOTE — Patient Instructions (Addendum)
Can continue TMC 0.1% cream 1-2 times daily to affected areas as needed for itch. Avoid applying to face, groin, and axilla. Use as directed. Long-term use can cause thinning of the skin. ?  ?For open areas, apply mupirocin once daily and cover with band aid as needed. ?  ?Patient injected today with Dupixent 300 mg/ 73m into right upper arm ? ? ? ? ?If You Need Anything After Your Visit ? ?If you have any questions or concerns for your doctor, please call our main line at [phone] press option 4 to reach your doctor's medical assistant. If no one answers, please leave a voicemail as directed and we will return your call as soon as possible. Messages left after 4 pm will be answered the following business day.  ? ?You may also send [place] message via MyChart. We typically respond to MyChart messages within 1-2 business days. ? ?For prescription refills, please ask your pharmacy to contact our office. Our fax number is [phone] ? ?If you have an urgent issue when the clinic is closed that cannot wait until the next business day, you can page your doctor at the number below.   ? ?Please note that while we do our best to be available for urgent issues outside of office hours, we are not available 24/7.  ? ?If you have an urgent issue and are unable to reach [place] you may choose to seek medical care at your doctor's office, retail clinic, urgent care center, or emergency room. ? ?If you have a medical emergency, please immediately call 911 or go to the emergency department. ? ?Pager Numbers ? ?- Dr. KNehemiah Massed [phone]? ?- Dr. MLaurence Ferrari [phone]? ?- Dr. SNicole Kindred [phone]? ?In the event of inclement weather, please call our main line at [phone] an update on the status of any delays or closures. ? ?Dermatology Medication Tips: ?Please keep the boxes that topical medications come in in order to help keep track of the instructions about where and how to use these. Pharmacies typically print the  medication instructions only on the boxes and not directly on the medication tubes.  ? ?If your medication is too expensive, please contact our office at [phone] 4 or send [place] message through [place]  ? ?We are unable to tell what your co-pay for medications will be in advance as this is different depending on your insurance coverage. However, we may be able to find a substitute medication at lower cost or fill out paperwork to get insurance to cover a needed medication.  ? ?If a prior authorization is required to get your medication covered by your insurance company, please allow u[place]1-2 business days to complete this process. ? ?Drug prices often vary depending on where the prescription is filled and some pharmacies may offer cheaper prices. ? ?The website www.goodrx.com contains coupons for medications through different pharmacies. The prices here do not account for what the cost may be with help from insurance (it may be cheaper with your insurance), but the website can give you the price if you did not use any insurance.  ?- You can print the associated coupon and take it with your prescription to the pharmacy.  ?- You may also stop by our office during regular business hours and pick up a GoodRx coupon card.  ?- If you need your prescription sent electronically to a different pharmacy, notify our office through [facility] by phone at [phone] 4. ? ? ? ? ?  Si Usted Necesita Algo Despu?s de Su Visita ? ?Tambi?n puede enviarnos un mensaje a trav?s de MyChart. Por lo general respondemos a los mensajes de MyChart en el transcurso de 1 a 2 d?as h?biles. ? ?Para renovar recetas, por favor pida a su farmacia que se ponga en contacto con nuestra oficina. Nuestro n?mero de fax es el [phone]. ? ?Si tiene un asunto urgente cuando la cl?nica est? cerrada y que no puede esperar hasta el siguiente d?a h?bil, puede llamar/localizar a su doctor(a) al n?mero que aparece a continuaci?n.   ? ?Por favor, tenga en cuenta que aunque hacemos todo lo posible para estar disponibles para asuntos urgentes fuera del horario de oficina, no estamos disponibles las 24 horas del d?a, los 7 d?as de la semana.  ? ?Si tiene un problema urgente y no puede comunicarse con nosotros, puede optar por buscar atenci?n m?dica  en el consultorio de su doctor(a), en una cl?nica privada, en un centro de atenci?n urgente o en una sala de emergencias. ? ?Si tiene Engineer, maintenance (IT) m?dica, por favor llame inmediatamente al 911 o vaya a la sala de emergencias. ? ?N?meros de b?per ? ?- Dr. Nehemiah Massed: [phone] ? ?- Dra. Moye: [phone] ? ?- Dra. Nicole Kindred: [phone] ? ?En caso de inclemencias del tiempo, por favor llame a nuestra l?nea principal al [phone] para una actualizaci?n sobre el estado de cualquier retraso o cierre. ? ?Consejos para la medicaci?n en dermatolog?a: ?Por favor, guarde las cajas en las que vienen los medicamentos de uso t?pico para ayudarle a seguir las instrucciones sobre d?nde y c?mo usarlos. Las farmacias generalmente imprimen las instrucciones del medicamento s?lo en las cajas y no directamente en los tubos del [place].  ? ?Si su medicamento es muy caro, por favor, p?ngase en contacto con Zigmund Daniel llamando al [phone] y presione la opci?n 4 o env?enos un mensaje a trav?s de MyChart.  ? ?No podemos decirle cu?l ser? su copago por los medicamentos por adelantado ya que esto es diferente dependiendo de la cobertura de su seguro. Sin embargo, es posible que podamos encontrar un medicamento sustituto a Electrical engineer un formulario para que el seguro cubra el medicamento que se considera necesario.  ? ?Si se requiere Ardelia Mems autorizaci?n previa para que su compa??a de seguros [place] su medicamento, por favor perm?tanos de 1 a 2 d?as h?biles para completar este proceso. ? ?Los precios de los medicamentos var?an con frecuencia dependiendo del Environmental consultant de d?nde se surte la receta y alguna  farmacias pueden ofrecer precios m?s baratos. ? ?El sitio web www.goodrx.com tiene cupones para medicamentos de Airline pilot. Los precios aqu? no tienen en cuenta lo que podr?a costar con la ayuda del seguro (puede ser m?s barato con su seguro), pero el sitio web puede darle el precio si no utiliz? ning?n seguro.  ?- Puede imprimir el cup?n correspondiente y llevarlo con su receta a la farmacia.  ?- Tambi?n puede pasar por nuestra oficina durante el horario de atenci?n regular y recoger una tarjeta de cupones de GoodRx.  ?- Si necesita que su receta se env?e electr?nicamente a Chiropodist, informe a nuestra oficina a trav?s de MyChart de [place] o por tel?fono llamando al [phone] y presione la opci?n 4.  ?

## 2021-05-17 ENCOUNTER — Encounter: Payer: Self-pay | Admitting: Dermatology

## 2021-05-20 ENCOUNTER — Encounter: Payer: Self-pay | Admitting: Oncology

## 2021-05-23 ENCOUNTER — Ambulatory Visit (INDEPENDENT_AMBULATORY_CARE_PROVIDER_SITE_OTHER): Payer: Medicare Other | Admitting: Dermatology

## 2021-05-23 DIAGNOSIS — R21 Rash and other nonspecific skin eruption: Secondary | ICD-10-CM | POA: Diagnosis not present

## 2021-05-23 DIAGNOSIS — L98492 Non-pressure chronic ulcer of skin of other sites with fat layer exposed: Secondary | ICD-10-CM

## 2021-05-23 DIAGNOSIS — L2081 Atopic neurodermatitis: Secondary | ICD-10-CM

## 2021-05-23 DIAGNOSIS — L239 Allergic contact dermatitis, unspecified cause: Secondary | ICD-10-CM | POA: Diagnosis not present

## 2021-05-23 MED ORDER — MUPIROCIN 2 % EX OINT: TOPICAL_OINTMENT | CUTANEOUS | 3 refills | Status: DC

## 2021-05-23 MED ORDER — DUPILUMAB 300 MG/2ML ~~LOC~~ SOSY
300.0000 mg | PREFILLED_SYRINGE | Freq: Once | SUBCUTANEOUS | Status: AC
  Administered 2021-05-23: 300 mg via SUBCUTANEOUS

## 2021-05-23 MED ORDER — TACROLIMUS 0.1 % EX OINT: TOPICAL_OINTMENT | Freq: Two times a day (BID) | CUTANEOUS | 0 refills | Status: DC

## 2021-05-23 MED ORDER — DOXYCYCLINE HYCLATE 100 MG PO CAPS: 100.0000 mg | ORAL_CAPSULE | Freq: Two times a day (BID) | ORAL | 0 refills | Status: AC

## 2021-05-23 NOTE — Progress Notes (Signed)
   Follow-Up Visit   Subjective  Laurie Rice is a 61 y.o. female who presents for the following: Follow-up (Atopic neurodermatitis follow up - 2 week Dupixent injection. She still has some areas popping up on her legs and one on her left leg has been draining.). She has history of MRSA infection of the skin in the past. Accompanied by caregiver who contributes to history.  The following portions of the chart were reviewed this encounter and updated as appropriate:   Tobacco  Allergies  Meds  Problems  Med Hx  Surg Hx  Fam Hx     Review of Systems:  No other skin or systemic complaints except as noted in HPI or Assessment and Plan.  Objective  Well appearing patient in no apparent distress; mood and affect are within normal limits.  A focused examination was performed including arms, legs. Relevant physical exam findings are noted in the Assessment and Plan.  Left Thigh - Anterior Pustules of legs   Assessment & Plan  Atopic neurodermatitis And allergic contact dermatitis  Biopsy proven - "spongiotic dermatitis" And Contact dermatitis from Hibiclens -patch test proven    Very complicated patient with multiple medical and skin issues.  Chronic and persistent condition with duration or expected duration over one year. Condition is bothersome/symptomatic for patient. Currently flared, but still overall improved.   Continue Dupixent '300mg'$ /72m, injected today to left upper arm Lot # [id number] Exp: 08/2023   Start Tacrolimus 0.1% oint bid to affected areas  Very complicated patient  dupilumab (DUPIXENT) prefilled syringe 300 mg  tacrolimus (PROTOPIC) 0.1 % ointment Apply topically 2 (two) times daily.  Rash -pustules and red papules on the legs Cellulitis/recurrent MRSA? Left Thigh - Anterior Bacterial culture performed today Mupirocin ointment to inside of nose and to any open sores of legs at bedtime for 1 month.  Start doxycycline 100 mg 1 tablet daily  with food and plenty of fluid x 2 weeks.  doxycycline (VIBRAMYCIN) 100 MG capsule - Left Thigh - Anterior Take 1 capsule (100 mg total) by mouth 2 (two) times daily. With food and plenty of fluid  Anaerobic and Aerobic Culture - Left Thigh - Anterior  Related Medications mupirocin ointment (BACTROBAN) 2 % Discontinue all previous orders for Mupirocin and apply to aa's as directed.  Return in about 2 weeks (around 06/06/2021) for Injection on Nurse schedule. Documentation: I have reviewed the above documentation for accuracy and completeness, and I agree with the above.  [name] MD

## 2021-05-23 NOTE — Patient Instructions (Addendum)
Mupirocin ointment to inside of nos and to any open sores of legs at bedtime for 1 month.  Doxycycline 100 mg 1 tablet daily with food and plenty of fluid x 2 weeks.  Tacrolimus ointment twice daily to any eczema areas.  Dupixent injection given on left upper arm today.  If You Need Anything After Your Visit  If you have any questions or concerns for your doctor, please call our main line at [phone] and press option 4 to reach your doctor's medical assistant. If no one answers, please leave a voicemail as directed and we will return your call as soon as possible. Messages left after 4 pm will be answered the following business day.   You may also send [name] a message via [place]. We typically respond to MyChart messages within 1-2 business days.  For prescription refills, please ask your pharmacy to contact our office. Our fax number is [phone].  If you have an urgent issue when the clinic is closed that cannot wait until the next business day, you can page your doctor at the number below.    Please note that while we do our best to be available for urgent issues outside of office hours, we are not available 24/7.   If you have an urgent issue and are unable to reach [name], you may choose to seek medical care at your doctor's office, retail clinic, urgent care center, or emergency room.  If you have a medical emergency, please immediately call 911 or go to the emergency department.  Pager Numbers  - Dr. Nehemiah Massed: [phone]  - Dr. Laurence Ferrari: [phone]  - Dr. Nicole Kindred: [phone]  In the event of inclement weather, please call our main line at [phone] for an update on the status of any delays or closures.  Dermatology Medication Tips: Please keep the boxes that topical medications come in in order to help keep track of the instructions about where and how to use these. Pharmacies typically print the medication instructions only on the boxes and not directly on the  medication tubes.   If your medication is too expensive, please contact our office at [phone] option 4 or send [name] a message through [place].   We are unable to tell what your co-pay for medications will be in advance as this is different depending on your insurance coverage. However, we may be able to find a substitute medication at lower cost or fill out paperwork to get insurance to cover a needed medication.   If a prior authorization is required to get your medication covered by your insurance company, please allow [name] 1-2 business days to complete this process.  Drug prices often vary depending on where the prescription is filled and some pharmacies may offer cheaper prices.  The website www.goodrx.com contains coupons for medications through different pharmacies. The prices here do not account for what the cost may be with help from insurance (it may be cheaper with your insurance), but the website can give you the price if you did not use any insurance.  - You can print the associated coupon and take it with your prescription to the pharmacy.  - You may also stop by our office during regular business hours and pick up a GoodRx coupon card.  - If you need your prescription sent electronically to a different pharmacy, notify our office through [facility] or by phone at [phone] option 4.     Si Usted Necesita Algo Despus de Su Visita  Tambin puede  enviarnos un mensaje a travs de MyChart. Por lo general respondemos a los mensajes de MyChart en el transcurso de 1 a 2 das hbiles.  Para renovar recetas, por favor pida a su farmacia que se ponga en contacto con nuestra oficina. Harland Dingwall de fax es [place] [phone].  Si tiene un asunto urgente cuando la clnica est cerrada y que no puede esperar hasta el siguiente da hbil, puede llamar/localizar a su doctor(a) al nmero que aparece a continuacin.   Por favor, tenga en cuenta que aunque hacemos todo lo posible  para estar disponibles para asuntos urgentes fuera del horario de [place], no estamos disponibles las 24 horas del da, los 7 das de la [place].   Si tiene un problema urgente y no puede comunicarse con nosotros, puede optar por buscar atencin mdica  en el consultorio de su doctor(a), en una clnica privada, en un centro de atencin urgente o en una sala de emergencias.  Si tiene Engineering geologist, por favor llame inmediatamente al 911 o vaya a la sala de emergencias.  Nmeros de bper  - Dr. Nehemiah Massed: [phone]  - Dra. Moye: [phone]  - Dra. Nicole Kindred: [phone]  En caso de inclemencias del [place], por favor llame a Johnsie Kindred principal al [phone] para una actualizacin sobre el [place] de cualquier retraso o cierre.  Consejos para la medicacin en dermatologa: Por favor, guarde las cajas en las que vienen los medicamentos de uso tpico para ayudarle a seguir las instrucciones sobre dnde y cmo usarlos. Las farmacias generalmente imprimen las instrucciones del medicamento slo en las cajas y no directamente en los tubos del [place].   Si su medicamento es muy caro, por favor, pngase en contacto con Zigmund Daniel llamando al [phone] y presione la opcin 4 o envenos un mensaje a travs de Pharmacist, community.   No podemos decirle cul ser su copago por los medicamentos por adelantado ya que esto es diferente dependiendo de la cobertura de su seguro. Sin embargo, es posible que podamos encontrar un medicamento sustituto a Electrical engineer un formulario para que el seguro cubra el medicamento que se considera necesario.   Si se requiere una autorizacin previa para que su compaa de seguros [place] su medicamento, por favor permtanos de 1 a 2 das hbiles para completar este proceso.  Los precios de los medicamentos varan con frecuencia dependiendo del Environmental consultant de dnde se surte la receta y alguna farmacias pueden ofrecer precios ms baratos.  El sitio web  www.goodrx.com tiene cupones para medicamentos de Airline pilot. Los precios aqu no tienen en cuenta lo que podra costar con la ayuda del seguro (puede ser ms barato con su seguro), pero el sitio web puede darle el precio si no utiliz Research scientist (physical sciences).  - Puede imprimir el cupn correspondiente y llevarlo con su receta a la farmacia.  - Tambin puede pasar por nuestra oficina durante el horario de atencin regular y Charity fundraiser una tarjeta de cupones de GoodRx.  - Si necesita que su receta se enve electrnicamente a una farmacia diferente, informe a nuestra oficina a travs de MyChart de [place] o por telfono llamando al [phone] y presione la opcin 4.

## 2021-05-27 ENCOUNTER — Ambulatory Visit (INDEPENDENT_AMBULATORY_CARE_PROVIDER_SITE_OTHER): Payer: Medicare Other | Admitting: Vascular Surgery

## 2021-05-27 ENCOUNTER — Encounter (INDEPENDENT_AMBULATORY_CARE_PROVIDER_SITE_OTHER): Payer: Self-pay | Admitting: Vascular Surgery

## 2021-05-27 VITALS — BP 170/71 | HR 70 | Resp 16 | Wt 191.0 lb

## 2021-05-27 DIAGNOSIS — C8510 Unspecified B-cell lymphoma, unspecified site: Secondary | ICD-10-CM | POA: Diagnosis not present

## 2021-05-27 DIAGNOSIS — M7989 Other specified soft tissue disorders: Secondary | ICD-10-CM | POA: Diagnosis not present

## 2021-05-27 DIAGNOSIS — I10 Essential (primary) hypertension: Secondary | ICD-10-CM

## 2021-05-27 DIAGNOSIS — I82501 Chronic embolism and thrombosis of unspecified deep veins of right lower extremity: Secondary | ICD-10-CM

## 2021-05-27 DIAGNOSIS — L97221 Non-pressure chronic ulcer of left calf limited to breakdown of skin: Secondary | ICD-10-CM

## 2021-05-27 NOTE — Assessment & Plan Note (Signed)
blood pressure control important in reducing the progression of atherosclerotic disease. On appropriate oral medications.  

## 2021-05-27 NOTE — Assessment & Plan Note (Signed)
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  Her right leg is much darker than the left and this was the leg with a DVT, so I suspect that plays a role in her lower extremity swelling as well.

## 2021-05-27 NOTE — Assessment & Plan Note (Signed)
Would certainly predispose her to chronic lower extremity swelling from lymphedema particular in the face of longstanding swelling which has resulted in scarring and lymphatic channels itself.

## 2021-05-27 NOTE — Progress Notes (Signed)
Patient ID: Laurie Rice, female   DOB: 09/27/1960, 61 y.o.   MRN: [id number]  Chief Complaint  Patient presents with   New Patient (Initial Visit)    Ref yu consult leg swelling    HPI Laurie Rice is a 61 y.o. female.  I am asked to see the patient by Dr. Tasia Catchings for evaluation of prominent lower extremity swelling.  The patient comes from a group home, and her history is provided by the group home worker today.  Patient does not verbalize anything to me today.  It is reported that her swelling has now been going on for years.  It has gotten worse over time.  The patient is very immobile and her legs remain dependent almost all the time.  She will not keep them elevated.  They tried compression socks in the past but the patient would not wear them.  She does have a history of DVT and is on Coumadin.  This was in the right leg which is the more discolored and slightly more swollen of the 2 legs but she actually has an ulcer on the left leg currently that is draining serous fluid.  No fevers or chills.  No current chest pain or shortness of breath.  Her swelling has been gradual and worsening over time.  It is difficult to discern how much this hurts her.     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   History of lymphocytosis    Impetigo    History of   Liver masses    History of, Benign   Meningioma (HCC)    Mild mental retardation    Monoclonal gammopathy    low level   Personal history of pulmonary embolism    Seizures (HCC)    Thyroid mass    history of    Past Surgical History:  Procedure Laterality Date   NO PAST SURGERIES       Family History  Problem Relation Age of Onset   Hypertension Other    Arthritis Other    Breast cancer Neg Hx       Social History   Tobacco Use   Smoking status: Never   Smokeless tobacco:  Never  Substance Use Topics   Alcohol use: No   Drug use: No     Allergies  Allergen Reactions   Dalbavancin Rash    Widespread rash with no systemic symptoms    Current Outpatient Medications  Medication Sig Dispense Refill   Bismuth Tribromoph-Petrolatum (XEROFORM OCCLUSIVE GAUZE PATCH) PADS Apply 1 application topically daily. 50 each 0   bisoprolol-hydrochlorothiazide (ZIAC) 2.5-6.25 MG tablet Take 2 tablets by mouth daily.     doxycycline (VIBRAMYCIN) 100 MG capsule Take 1 capsule (100 mg total) by mouth 2 (two) times daily. With food and plenty of fluid 28 capsule 0   dupilumab (DUPIXENT) 300 MG/2ML prefilled syringe Inject 300 mg into the skin every 14 (fourteen) days. Starting at day 15 for maintenance. 4 mL 3   Emollient (CETAPHIL) cream      ferrous sulfate 325 (65 FE) MG EC tablet Take 1 tablet (325 mg total) by mouth daily with breakfast. With orange juice 30 tablet 3   folic acid (FOLVITE) 1 MG tablet TAKE ONE TABLET BY MOUTH EACH DAY.  Gauze Pads & Dressings (KERLIX BANDAGE ROLL 4.5"X9.3') MISC 1 application by Does not apply route daily. 100 each 0   mupirocin ointment (BACTROBAN) 2 % Apply 1 application. topically daily. Daily with dressing change 22 g 2   mupirocin ointment (BACTROBAN) 2 % Discontinue all previous orders for Mupirocin and apply to aa's as directed. 22 g 3   PHENObarbital (LUMINAL) 60 MG tablet Take 60 mg by mouth at bedtime.     phenytoin (DILANTIN) 100 MG ER capsule TAKE 2 CAPSULES BY MOUTH AT BEDTIME (SEIZURE DISORDER)     phenytoin (DILANTIN) 50 MG tablet Chew 50 mg by mouth at bedtime.     tacrolimus (PROTOPIC) 0.1 % ointment Apply topically 2 (two) times daily. 100 g 0   warfarin (COUMADIN) 1 MG tablet Take 1 tablet (1 mg total) by mouth daily. 30 tablet 11   warfarin (COUMADIN) 1 MG tablet Take 1 tablet (1 mg total) by mouth every Monday, Wednesday, and Friday. Take with (2) 4 mg tablets every Monday, Wednesday, and Friday for a total of 9 mg  on those days. 15 tablet 0   warfarin (COUMADIN) 4 MG tablet Take 2 tablets (8 mg total) by mouth daily at 4 PM. Take 2 tablets daily for 8 mg daily. Add (1) 1 mg tablet on MWF for a total of 9 mg on those days. 60 tablet 0   chlorhexidine (HIBICLENS) 4 % external liquid Wash from the neck down excluding the vagina as directed while showering. Do not get in the eyes or ears. (Patient not taking: Reported on 02/26/2021) 120 mL 2   hydrocortisone 2.5 % cream Apply twice daily to abdominal area for two weeks. Apply 2nd (Patient not taking: Reported on 02/26/2021) 60 g 3   ketoconazole (NIZORAL) 2 % cream Apply twice daily to abdominal area for 2 weeks. Apply 1st (Patient not taking: Reported on 02/26/2021) 60 g 2   No current facility-administered medications for this visit.      REVIEW OF SYSTEMS (Negative unless checked)  Constitutional: '[]'$ Weight loss  '[]'$ Fever  '[]'$ Chills Cardiac: '[]'$ Chest pain   '[]'$ Chest pressure   '[]'$ Palpitations   '[]'$ Shortness of breath when laying flat   '[]'$ Shortness of breath at rest   '[]'$ Shortness of breath with exertion. Vascular:  '[]'$ Pain in legs with walking   '[]'$ Pain in legs at rest   '[]'$ Pain in legs when laying flat   '[]'$ Claudication   '[]'$ Pain in feet when walking  '[]'$ Pain in feet at rest  '[]'$ Pain in feet when laying flat   '[x]'$ History of DVT   '[x]'$ Phlebitis   '[x]'$ Swelling in legs   '[]'$ Varicose veins   '[x]'$ Non-healing ulcers Pulmonary:   '[]'$ Uses home oxygen   '[]'$ Productive cough   '[]'$ Hemoptysis   '[]'$ Wheeze  '[]'$ COPD   '[]'$ Asthma Neurologic:  '[]'$ Dizziness  '[]'$ Blackouts   '[]'$ Seizures   '[]'$ History of stroke   '[]'$ History of TIA  '[]'$ Aphasia   '[]'$ Temporary blindness   '[]'$ Dysphagia   '[]'$ Weakness or numbness in arms   '[]'$ Weakness or numbness in legs Musculoskeletal:  '[x]'$ Arthritis   '[]'$ Joint swelling   '[x]'$ Joint pain   '[]'$ Low back pain Hematologic:  '[]'$ Easy bruising  '[]'$ Easy bleeding   '[]'$ Hypercoagulable state   '[]'$ Anemic  '[]'$ Hepatitis Gastrointestinal:  '[]'$ Blood in stool   '[]'$ Vomiting blood  '[]'$ Gastroesophageal reflux/heartburn    '[]'$ Abdominal pain Genitourinary:  '[]'$ Chronic kidney disease   '[]'$ Difficult urination  '[]'$ Frequent urination  '[]'$ Burning with urination   '[]'$ Hematuria Skin:  '[]'$ Rashes   '[x]'$ Ulcers   '[x]'$ Wounds Psychological:  '[]'$ History of anxiety   '[]'$   History of major depression.    Physical Exam BP (!) 170/71 (BP Location: Right Arm)   Pulse 70   Resp 16   Wt 191 lb (86.6 kg)   BMI 29.91 kg/m  Gen:  WD/WN, NAD Head: [place]/AT, No temporalis wasting.  Ear/Nose/Throat: Hearing grossly intact, nares w/o erythema or drainage, oropharynx w/o Erythema/Exudate Eyes: Conjunctiva clear, sclera non-icteric  Neck: trachea midline.  No JVD.  Pulmonary:  Good air movement, respirations not labored, no use of accessory muscles  Cardiac: RRR, no JVD Vascular:  Vessel Right Left  Radial Palpable Palpable                          DP NP NP  PT NP NP    Musculoskeletal: M/S 5/5 throughout.  Extremities without ischemic changes.  No deformity or atrophy. 2-3+ BLE edema. Neurologic: Sensation grossly intact in extremities.  Symmetrical.  Does not speak today. Motor exam as listed above. Psychiatric: Judgment  and insight appear very poor Dermatologic: Small circular wounds on the lateral and posterior aspect of the left calf with draining serous fluid    Radiology No results found.  Labs Recent Results (from the past 2160 hour(s))  Comprehensive metabolic panel     Status: Abnormal   Collection Time: 02/26/21  6:10 PM  Result Value Ref Range   Sodium 140 135 - 145 mmol/L   Potassium 4.4 3.5 - 5.1 mmol/L    Comment: HEMOLYSIS AT THIS LEVEL MAY AFFECT RESULT   Chloride 104 98 - 111 mmol/L   CO2 27 22 - 32 mmol/L   Glucose, Bld 103 (H) 70 - 99 mg/dL    Comment: Glucose reference range applies only to samples taken after fasting for at least 8 hours.   BUN 16 8 - 23 mg/dL   Creatinine, Ser 0.71 0.44 - 1.00 mg/dL   Calcium 8.6 (L) 8.9 - 10.3 mg/dL   Total Protein 6.3 (L) 6.5 - 8.1 g/dL   Albumin 3.3 (L) 3.5 -  5.0 g/dL   AST 23 15 - 41 U/L    Comment: HEMOLYSIS AT THIS LEVEL MAY AFFECT RESULT   ALT 15 0 - 44 U/L   Alkaline Phosphatase 73 38 - 126 U/L   Total Bilirubin 0.5 0.3 - 1.2 mg/dL    Comment: HEMOLYSIS AT THIS LEVEL MAY AFFECT RESULT   GFR, Estimated >60 >60 mL/min    Comment: (NOTE) Calculated using the CKD-EPI Creatinine Equation (2021)    Anion gap 9 5 - 15    Comment: Performed at [facility], [address] [place], [place] [address]  CBC with Differential     Status: Abnormal   Collection Time: 02/26/21  6:10 PM  Result Value Ref Range   WBC 11.0 (H) 4.0 - 10.5 K/uL   RBC 5.19 (H) 3.87 - 5.11 MIL/uL   Hemoglobin 11.0 (L) 12.0 - 15.0 g/dL   HCT 37.3 36.0 - 46.0 %   MCV 71.9 (L) 80.0 - 100.0 fL   MCH 21.2 (L) 26.0 - 34.0 pg   MCHC 29.5 (L) 30.0 - 36.0 g/dL   RDW 18.0 (H) 11.5 - 15.5 %   Platelets 283 150 - 400 K/uL   nRBC 0.0 0.0 - 0.2 %   Neutrophils Relative % 70 %   Neutro Abs 7.7 1.7 - 7.7 K/uL   Lymphocytes Relative 21 %   Lymphs Abs 2.3 0.7 - 4.0 K/uL   Monocytes Relative 7 %  Monocytes Absolute 0.8 0.1 - 1.0 K/uL   Eosinophils Relative 2 %   Eosinophils Absolute 0.2 0.0 - 0.5 K/uL   Basophils Relative 0 %   Basophils Absolute 0.0 0.0 - 0.1 K/uL   Immature Granulocytes 0 %   Abs Immature Granulocytes 0.03 0.00 - 0.07 K/uL    Comment: Performed at [facility], [address] [place], [place] [address]  Lactic acid, plasma     Status: None   Collection Time: 02/26/21  8:10 PM  Result Value Ref Range   Lactic Acid, Venous 1.2 0.5 - 1.9 mmol/L    Comment: Performed at [facility], [address] [place], [place] [address]  Culture, blood (routine x 2)     Status: None   Collection Time: 02/26/21  8:19 PM   Specimen: BLOOD  Result Value Ref Range   Specimen Description BLOOD BLOOD LEFT FOREARM    Special Requests      BOTTLES DRAWN AEROBIC AND ANAEROBIC Blood Culture results may not be optimal due to an excessive volume of  blood received in culture bottles   Culture      NO GROWTH 5 DAYS Performed at [facility], [address] [place], [place] [address]    Report Status 03/03/2021 FINAL   Resp Panel by RT-PCR (Flu A&B, Covid) Nasopharyngeal Swab     Status: None   Collection Time: 02/26/21  9:51 PM   Specimen: Nasopharyngeal Swab; Nasopharyngeal(NP) swabs in vial transport medium  Result Value Ref Range   SARS Coronavirus 2 by RT PCR NEGATIVE NEGATIVE    Comment: (NOTE) SARS-CoV-2 target nucleic acids are NOT DETECTED.  The SARS-CoV-2 RNA is generally detectable in upper respiratory specimens during the acute phase of infection. The lowest concentration of SARS-CoV-2 viral copies this assay can detect is 138 copies/mL. A negative result does not preclude SARS-Cov-2 infection and should not be used as the sole basis for treatment or other patient management decisions. A negative result may occur with  improper specimen collection/handling, submission of specimen other than nasopharyngeal swab, presence of viral mutation(s) within the areas targeted by this assay, and inadequate number of viral copies(<138 copies/mL). A negative result must be combined with clinical observations, patient history, and epidemiological information. The expected result is Negative.  Fact Sheet for Patients:  EntrepreneurPulse.com.au  Fact Sheet for Healthcare Providers:  IncredibleEmployment.be  This test is no t yet approved or cleared by the [place] FDA and  has been authorized for detection and/or diagnosis of SARS-CoV-2 by FDA under an Emergency Use Authorization (EUA). This EUA will remain  in effect (meaning this test can be used) for the duration of the COVID-19 declaration under Section 564(b)(1) of the Act, 21 U.S.C.section 360bbb-3(b)(1), unless the authorization is terminated  or revoked sooner.       Influenza A by PCR NEGATIVE NEGATIVE   Influenza  B by PCR NEGATIVE NEGATIVE    Comment: (NOTE) The Xpert Xpress SARS-CoV-2/FLU/RSV plus assay is intended as an aid in the diagnosis of influenza from Nasopharyngeal swab specimens and should not be used as a sole basis for treatment. Nasal washings and aspirates are unacceptable for Xpert Xpress SARS-CoV-2/FLU/RSV testing.  Fact Sheet for Patients: EntrepreneurPulse.com.au  Fact Sheet for Healthcare Providers: IncredibleEmployment.be  This test is not yet approved or cleared by the [place] FDA and has been authorized for detection and/or diagnosis of SARS-CoV-2 by FDA under an Emergency Use Authorization (EUA). This EUA will remain in effect (meaning this  test can be used) for the duration of the COVID-19 declaration under Section 564(b)(1) of the Act, 21 U.S.C. section 360bbb-3(b)(1), unless the authorization is terminated or revoked.  Performed at [facility], [address] [place], [place] [address]   Protime-INR     Status: Abnormal   Collection Time: 02/27/21  2:43 AM  Result Value Ref Range   Prothrombin Time 26.3 (H) 11.4 - 15.2 seconds   INR 2.4 (H) 0.8 - 1.2    Comment: (NOTE) INR goal varies based on device and disease states. Performed at [facility], [address] [place], [place] [address]   APTT     Status: None   Collection Time: 02/27/21  2:43 AM  Result Value Ref Range   aPTT 32 24 - 36 seconds    Comment: Performed at [facility], [address] [place], [place] [address]  HIV Antibody (routine testing w rflx)     Status: None   Collection Time: 02/27/21  2:43 AM  Result Value Ref Range   HIV Screen 4th Generation wRfx Non Reactive Non Reactive    Comment: Performed at [facility], [address], [place], [place] [address]  Protime-INR     Status: Abnormal   Collection Time: 02/28/21  6:36 AM  Result Value Ref Range   Prothrombin Time 24.9 (H) 11.4 - 15.2 seconds    INR 2.3 (H) 0.8 - 1.2    Comment: (NOTE) INR goal varies based on device and disease states. Performed at [facility], [address] [place], [place] [address]   Culture, blood (Routine X 2) w Reflex to ID Panel     Status: None   Collection Time: 02/28/21  6:36 AM   Specimen: BLOOD  Result Value Ref Range   Specimen Description BLOOD LEFT HAND    Special Requests      BOTTLES DRAWN AEROBIC AND ANAEROBIC Blood Culture adequate volume   Culture      NO GROWTH 5 DAYS Performed at [facility], [address] [place], [place] [address]    Report Status 03/05/2021 FINAL   CBC with Differential/Platelet     Status: Abnormal   Collection Time: 02/28/21  6:36 AM  Result Value Ref Range   WBC 9.0 4.0 - 10.5 K/uL   RBC 4.68 3.87 - 5.11 MIL/uL   Hemoglobin 9.9 (L) 12.0 - 15.0 g/dL   HCT 32.7 (L) 36.0 - 46.0 %   MCV 69.9 (L) 80.0 - 100.0 fL   MCH 21.2 (L) 26.0 - 34.0 pg   MCHC 30.3 30.0 - 36.0 g/dL   RDW 17.2 (H) 11.5 - 15.5 %   Platelets 211 150 - 400 K/uL   nRBC 0.0 0.0 - 0.2 %   Neutrophils Relative % 59 %   Neutro Abs 5.2 1.7 - 7.7 K/uL   Lymphocytes Relative 30 %   Lymphs Abs 2.7 0.7 - 4.0 K/uL   Monocytes Relative 8 %   Monocytes Absolute 0.7 0.1 - 1.0 K/uL   Eosinophils Relative 3 %   Eosinophils Absolute 0.3 0.0 - 0.5 K/uL   Basophils Relative 0 %   Basophils Absolute 0.0 0.0 - 0.1 K/uL   WBC Morphology MORPHOLOGY UNREMARKABLE    RBC Morphology See Note     Comment: MIXED RBC MORPHOLOGY   Smear Review Normal platelet morphology    Immature Granulocytes 0 %   Abs Immature Granulocytes 0.03 0.00 - 0.07 K/uL    Comment: Performed  at [facility], [address] [place], [place] [address]  Basic metabolic panel     Status: Abnormal   Collection Time: 02/28/21  6:36 AM  Result Value Ref Range   Sodium 142 135 - 145 mmol/L    Comment: Performed at [facility], [address] [place], [place] [address]   Potassium 4.1 3.5 - 5.1  mmol/L    Comment: Performed at [facility], [place], [place] [address]   Chloride 107 98 - 111 mmol/L    Comment: Performed at [facility], [place], [place] [address]   CO2 27 22 - 32 mmol/L    Comment: Performed at [facility], [place], [place] [address]   Glucose, Bld 81 70 - 99 mg/dL    Comment: Glucose reference range applies only to samples taken after fasting for at least 8 hours. Performed at [facility], [address] [place], [place] [address]    BUN 10 8 - 23 mg/dL   Creatinine, Ser 0.61 0.44 - 1.00 mg/dL   Calcium 7.7 (L) 8.9 - 10.3 mg/dL   GFR, Estimated >60 >60 mL/min    Comment: (NOTE) Calculated using the CKD-EPI Creatinine Equation (2021)    Anion gap 8 5 - 15    Comment: Performed at [facility], [address] [place], [place] [address]  MRSA Next Gen by PCR, Nasal     Status: None   Collection Time: 02/28/21 11:46 AM   Specimen: Nasal Mucosa; Nasal Swab  Result Value Ref Range   MRSA by PCR Next Gen NOT DETECTED NOT DETECTED    Comment: (NOTE) The GeneXpert MRSA Assay (FDA approved for NASAL specimens only), is one component of a comprehensive MRSA colonization surveillance program. It is not intended to diagnose MRSA infection nor to guide or monitor treatment for MRSA infections. Test performance is not FDA approved in patients less than 10 years old. Performed at [facility], [address] [place], [place] [address]   Protime-INR     Status: Abnormal   Collection Time: 03/01/21  3:56 AM  Result Value Ref Range   Prothrombin Time 21.8 (H) 11.4 - 15.2 seconds   INR 1.9 (H) 0.8 - 1.2    Comment: (NOTE) INR goal varies based on device and disease states. Performed at [facility], [address] [place], [place] [address]     Assessment/Plan:  Swelling of limb The patient has prominent and pronounced swelling and has been  getting worse for many years.  She has a litany of reasons to have swelling including previous DVT causing postphlebitic syndrome, a lymphoma which would almost certainly predispose her to lymphedema, and the fact that she is very immobile with her legs dependent without compression almost all the time.  It is going to be difficult or impossible to get her swelling under control with minimal compliance to conservative therapy such as compression socks, elevation, and activity.  We are going to wrap her in a 3 layer Unna boot in both lower extremities today both to get the wound under control on the left leg as well as to get the prominent swelling under control in both lower extremities.  These will be changed weekly for the next several weeks and we will plan to see her back in about 4 weeks in follow-up.  Low grade B-cell lymphoma (HCC) Would certainly predispose her to chronic lower extremity swelling from lymphedema  particular in the face of longstanding swelling which has resulted in scarring and lymphatic channels itself.  Essential hypertension blood pressure control important in reducing the progression of atherosclerotic disease. On appropriate oral medications.   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  Her right leg is much darker than the left and this was the leg with a DVT, so I suspect that plays a role in her lower extremity swelling as well.  Lower limb ulcer, calf, left, limited to breakdown of skin (HCC) From severe swelling and mild trauma.  3 layer Unna boot will be placed today and changed weekly.      Leotis Pain 05/27/2021, 12:10 PM   This note was created with Dragon medical transcription system.  Any errors from dictation are unintentional.

## 2021-05-27 NOTE — Assessment & Plan Note (Signed)
The patient has prominent and pronounced swelling and has been getting worse for many years.  She has a litany of reasons to have swelling including previous DVT causing postphlebitic syndrome, a lymphoma which would almost certainly predispose her to lymphedema, and the fact that she is very immobile with her legs dependent without compression almost all the time.  It is going to be difficult or impossible to get her swelling under control with minimal compliance to conservative therapy such as compression socks, elevation, and activity.  We are going to wrap her in a 3 layer Unna boot in both lower extremities today both to get the wound under control on the left leg as well as to get the prominent swelling under control in both lower extremities.  These will be changed weekly for the next several weeks and we will plan to see her back in about 4 weeks in follow-up.

## 2021-05-27 NOTE — Assessment & Plan Note (Signed)
From severe swelling and mild trauma.  3 layer Unna boot will be placed today and changed weekly.

## 2021-05-29 ENCOUNTER — Encounter: Payer: Self-pay | Admitting: Dermatology

## 2021-06-01 LAB — ANAEROBIC AND AEROBIC CULTURE

## 2021-06-02 ENCOUNTER — Telehealth (INDEPENDENT_AMBULATORY_CARE_PROVIDER_SITE_OTHER): Payer: Self-pay

## 2021-06-02 ENCOUNTER — Telehealth: Payer: Self-pay

## 2021-06-02 NOTE — Telephone Encounter (Signed)
Spoke with [facility], the nurse practitioner has advised the unna boot will stay on as long as patient as a open wound or sore but she is to finish the two week of Doxycycline.   Tried calling patient's nursing home facility but no answer and no option to leave a VM.

## 2021-06-02 NOTE — Telephone Encounter (Signed)
Caryl Pina from [employer] called nad left a VM stating that Mrs Strathman had an ulcer cultured there and the results are MRSA positive.  The Dr Prescribed her Doxycycline '100mg'$  1 pill PO BID. I spoke with Eulogio Ditch NP and she said as long as there is swelling and an open sore that we will continue with unnaboots until the sores/wounds are healed and she should continue the Doxycycline as directed above.  I called and explained this to [facility] at the [facility] as Caryl Pina was not available.  Estill Bamberg stated verbal understanding

## 2021-06-02 NOTE — Telephone Encounter (Signed)
-----   Message from Ralene Bathe, MD sent at 06/01/2021  5:18 PM EDT ----- Culture from leg on 05/23/2021 showed + MRSA Sensitive to Tetracycline Continue Doxycycline x 2 weeks as prescribed.

## 2021-06-02 NOTE — Telephone Encounter (Signed)
Patient's caregiver Elie Confer informed of culture result. She states that patient is currently seeing [facility] and has unna boots placed QW. I left a message on the nurse line at [facility] to see if caregiver should remove patient's unna boots until she finishes her two week course of Doxycycline '100mg'$  po BID and asked them to return my call and to contact caregiver with further instruction.

## 2021-06-03 ENCOUNTER — Encounter (INDEPENDENT_AMBULATORY_CARE_PROVIDER_SITE_OTHER): Payer: Self-pay

## 2021-06-03 ENCOUNTER — Ambulatory Visit (INDEPENDENT_AMBULATORY_CARE_PROVIDER_SITE_OTHER): Payer: Medicare Other | Admitting: Nurse Practitioner

## 2021-06-03 VITALS — BP 127/81 | HR 53 | Resp 17

## 2021-06-03 DIAGNOSIS — L97221 Non-pressure chronic ulcer of left calf limited to breakdown of skin: Secondary | ICD-10-CM

## 2021-06-03 NOTE — Progress Notes (Signed)
History of Present Illness  There is no documented history at this time  Assessments & Plan   There are no diagnoses linked to this encounter.    Additional instructions  Subjective:  Patient presents with venous ulcer of the Bilateral lower extremity.    Procedure:  3 layer unna wrap was placed Bilateral lower extremity.   Plan:   Follow up in one week.  

## 2021-06-06 ENCOUNTER — Ambulatory Visit (INDEPENDENT_AMBULATORY_CARE_PROVIDER_SITE_OTHER): Payer: Medicare Other | Admitting: Dermatology

## 2021-06-06 DIAGNOSIS — L209 Atopic dermatitis, unspecified: Secondary | ICD-10-CM

## 2021-06-06 MED ORDER — DUPILUMAB 300 MG/2ML ~~LOC~~ SOSY
300.0000 mg | PREFILLED_SYRINGE | Freq: Once | SUBCUTANEOUS | Status: AC
  Administered 2021-06-06: 300 mg via SUBCUTANEOUS

## 2021-06-06 NOTE — Progress Notes (Signed)
Patient here today for two week Dupixent injection for severe atopic dermatitis.   Dupixent '300mg'$ /52m injected into right upper arm. Patient tolerated well.  LOT:: [id number]: 10/2023  Care giver states her leg is completely improved from most recent MRSA infection and no complaints or issues.  AJohnsie Kindred RMA  Documentation: I have reviewed the above documentation for accuracy and completeness, and I agree with the above.  TBrendolyn PattyMD

## 2021-06-07 ENCOUNTER — Encounter (INDEPENDENT_AMBULATORY_CARE_PROVIDER_SITE_OTHER): Payer: Self-pay | Admitting: Nurse Practitioner

## 2021-06-10 ENCOUNTER — Ambulatory Visit (INDEPENDENT_AMBULATORY_CARE_PROVIDER_SITE_OTHER): Payer: Medicare Other | Admitting: Nurse Practitioner

## 2021-06-10 ENCOUNTER — Encounter (INDEPENDENT_AMBULATORY_CARE_PROVIDER_SITE_OTHER): Payer: Self-pay

## 2021-06-10 VITALS — BP 132/77 | HR 60 | Resp 16 | Wt 188.0 lb

## 2021-06-10 DIAGNOSIS — L97221 Non-pressure chronic ulcer of left calf limited to breakdown of skin: Secondary | ICD-10-CM

## 2021-06-10 NOTE — Progress Notes (Unsigned)
History of Present Illness  There is no documented history at this time  Assessments & Plan   There are no diagnoses linked to this encounter.    Additional instructions  Subjective:  Patient presents with venous ulcer of the Bilateral lower extremity.    Procedure:  3 layer unna wrap was placed Bilateral lower extremity.   Plan:   Follow up in one week.  

## 2021-06-13 ENCOUNTER — Encounter (INDEPENDENT_AMBULATORY_CARE_PROVIDER_SITE_OTHER): Payer: Self-pay | Admitting: Nurse Practitioner

## 2021-06-17 ENCOUNTER — Ambulatory Visit (INDEPENDENT_AMBULATORY_CARE_PROVIDER_SITE_OTHER): Payer: Medicare Other | Admitting: Vascular Surgery

## 2021-06-17 ENCOUNTER — Encounter (INDEPENDENT_AMBULATORY_CARE_PROVIDER_SITE_OTHER): Payer: Self-pay

## 2021-06-17 VITALS — BP 136/80 | HR 60 | Resp 16 | Wt 187.6 lb

## 2021-06-17 DIAGNOSIS — L97221 Non-pressure chronic ulcer of left calf limited to breakdown of skin: Secondary | ICD-10-CM | POA: Diagnosis not present

## 2021-06-17 NOTE — Progress Notes (Signed)
History of Present Illness  There is no documented history at this time  Assessments & Plan   There are no diagnoses linked to this encounter.    Additional instructions  Subjective:  Patient presents with venous ulcer of the Bilateral lower extremity.    Procedure:  3 layer unna wrap was placed Bilateral lower extremity.   Plan:   Follow up in one week.  

## 2021-06-18 ENCOUNTER — Other Ambulatory Visit: Payer: Self-pay | Admitting: Nurse Practitioner

## 2021-06-20 ENCOUNTER — Ambulatory Visit (INDEPENDENT_AMBULATORY_CARE_PROVIDER_SITE_OTHER): Payer: Medicare Other | Admitting: Dermatology

## 2021-06-20 DIAGNOSIS — L2389 Allergic contact dermatitis due to other agents: Secondary | ICD-10-CM | POA: Diagnosis not present

## 2021-06-20 DIAGNOSIS — Z8614 Personal history of Methicillin resistant Staphylococcus aureus infection: Secondary | ICD-10-CM | POA: Diagnosis not present

## 2021-06-20 DIAGNOSIS — L209 Atopic dermatitis, unspecified: Secondary | ICD-10-CM | POA: Diagnosis not present

## 2021-06-20 DIAGNOSIS — L97221 Non-pressure chronic ulcer of left calf limited to breakdown of skin: Secondary | ICD-10-CM | POA: Diagnosis not present

## 2021-06-20 DIAGNOSIS — L97211 Non-pressure chronic ulcer of right calf limited to breakdown of skin: Secondary | ICD-10-CM

## 2021-06-20 MED ORDER — DUPILUMAB 300 MG/2ML ~~LOC~~ SOSY
300.0000 mg | PREFILLED_SYRINGE | Freq: Once | SUBCUTANEOUS | Status: AC
  Administered 2021-06-20: 300 mg via SUBCUTANEOUS

## 2021-06-20 NOTE — Patient Instructions (Addendum)
Wash the skin with CLN wash once weekly (samples given). May use Dove Sensitive Skin soap during the other times the patient bathes.   Continue Mupirocin 2% ointment inside the nose on Friday, Saturday, and Sunday evening.   Dupixent '300mg'$ /60m injected SQ into the L upper arm post. Pt tolerated injection well. AL, CMA    Due to recent changes in healthcare laws, you may see results of your pathology and/or laboratory studies on MyChart before the doctors have had a chance to review them. We understand that in some cases there may be results that are confusing or concerning to you. Please understand that not all results are received at the same time and often the doctors may need to interpret multiple results in order to provide you with the best plan of care or course of treatment. Therefore, we ask that you please give [place]2 business days to thoroughly review all your results before contacting the office for clarification. Should we see a critical lab result, you will be contacted sooner.   If You Need Anything After Your Visit  If you have any questions or concerns for your doctor, please call our main line at [phone] press option 4 to reach your doctor's medical assistant. If no one answers, please leave a voicemail as directed and we will return your call as soon as possible. Messages left after 4 pm will be answered the following business day.   You may also send [place] message via [facility] We typically respond to MyChart messages within 1-2 business days.  For prescription refills, please ask your pharmacy to contact our office. Our fax number is [phone]  If you have an urgent issue when the clinic is closed that cannot wait until the next business day, you can page your doctor at the number below.    Please note that while we do our best to be available for urgent issues outside of office hours, we are not available 24/7.   If you have an urgent issue and are unable to reach  [place] you may choose to seek medical care at your doctor's office, retail clinic, urgent care center, or emergency room.  If you have a medical emergency, please immediately call 911 or go to the emergency department.  Pager Numbers  - Dr. KNehemiah Massed [phone] - Dr. MLaurence Ferrari [phone] - Dr. SNicole Kindred [phone] In the event of inclement weather, please call our main line at [phone] an update on the status of any delays or closures.  Dermatology Medication Tips: Please keep the boxes that topical medications come in in order to help keep track of the instructions about where and how to use these. Pharmacies typically print the medication instructions only on the boxes and not directly on the medication tubes.   If your medication is too expensive, please contact our office at [phone] 4 or send [place] message through [facility]   We are unable to tell what your co-pay for medications will be in advance as this is different depending on your insurance coverage. However, we may be able to find a substitute medication at lower cost or fill out paperwork to get insurance to cover a needed medication.   If a prior authorization is required to get your medication covered by your insurance company, please allow u[place]1-2 business days to complete this process.  Drug prices often vary depending on where the prescription is filled and some pharmacies may offer cheaper prices.  The  website www.goodrx.com contains coupons for medications through different pharmacies. The prices here do not account for what the cost may be with help from insurance (it may be cheaper with your insurance), but the website can give you the price if you did not use any insurance.  - You can print the associated coupon and take it with your prescription to the pharmacy.  - You may also stop by our office during regular business hours and pick up a GoodRx coupon card.  - If you need your prescription sent  electronically to a different pharmacy, notify our office through [facility] or by phone at [phone] option 4.     Si Usted Necesita Algo Despus de Su Visita  Tambin puede enviarnos un mensaje a travs de Pharmacist, community. Por lo general respondemos a los mensajes de MyChart en el transcurso de 1 a 2 das hbiles.  Para renovar recetas, por favor pida a su farmacia que se ponga en contacto con nuestra oficina. Harland Dingwall de fax es [place] [phone].  Si tiene un asunto urgente cuando la clnica est cerrada y que no puede esperar hasta el siguiente da hbil, puede llamar/localizar a su doctor(a) al nmero que aparece a continuacin.   Por favor, tenga en cuenta que aunque hacemos todo lo posible para estar disponibles para asuntos urgentes fuera del horario de [place], no estamos disponibles las 24 horas del da, los 7 das de la [place].   Si tiene un problema urgente y no puede comunicarse con nosotros, puede optar por buscar atencin mdica  en el consultorio de su doctor(a), en una clnica privada, en un centro de atencin urgente o en una sala de emergencias.  Si tiene Engineering geologist, por favor llame inmediatamente al 911 o vaya a la sala de emergencias.  Nmeros de bper  - Dr. Nehemiah Massed: [phone]  - Dra. Moye: [phone]  - Dra. Nicole Kindred: [phone]  En caso de inclemencias del [place], por favor llame a Johnsie Kindred principal al [phone] para una actualizacin sobre el [place] de cualquier retraso o cierre.  Consejos para la medicacin en dermatologa: Por favor, guarde las cajas en las que vienen los medicamentos de uso tpico para ayudarle a seguir las instrucciones sobre dnde y cmo usarlos. Las farmacias generalmente imprimen las instrucciones del medicamento slo en las cajas y no directamente en los tubos del [place].   Si su medicamento es muy caro, por favor, pngase en contacto con Zigmund Daniel llamando al [phone] y presione la  opcin 4 o envenos un mensaje a travs de Pharmacist, community.   No podemos decirle cul ser su copago por los medicamentos por adelantado ya que esto es diferente dependiendo de la cobertura de su seguro. Sin embargo, es posible que podamos encontrar un medicamento sustituto a Electrical engineer un formulario para que el seguro cubra el medicamento que se considera necesario.   Si se requiere una autorizacin previa para que su compaa de seguros [place] su medicamento, por favor permtanos de 1 a 2 das hbiles para completar este proceso.  Los precios de los medicamentos varan con frecuencia dependiendo del Environmental consultant de dnde se surte la receta y alguna farmacias pueden ofrecer precios ms baratos.  El sitio web www.goodrx.com tiene cupones para medicamentos de Airline pilot. Los precios aqu no tienen en cuenta lo que podra costar con la ayuda del seguro (puede ser ms barato con su seguro), pero el sitio web puede darle el precio si no utiliz Research scientist (physical sciences).  - Puede  imprimir el cupn correspondiente y llevarlo con su receta a la farmacia.  - Tambin puede pasar por nuestra oficina durante el horario de atencin regular y Charity fundraiser una tarjeta de cupones de GoodRx.  - Si necesita que su receta se enve electrnicamente a una farmacia diferente, informe a nuestra oficina a travs de MyChart de [place] o por telfono llamando al [phone] y presione la opcin 4.

## 2021-06-20 NOTE — Progress Notes (Unsigned)
   Follow-Up Visit   Subjective  Laurie Rice is a 61 y.o. female who presents for the following: Atopic dermatitis (Of the trunk and extremities - patient is here today for her two week Dupixent injection) and Hx of MRSA infection (On the legs - pt has finished Doxycycline but has continued to use the Mupirocin 2% ointment inside the nose at night. Legs have improved per caregiver).  The following portions of the chart were reviewed this encounter and updated as appropriate:   Tobacco  Allergies  Meds  Problems  Med Hx  Surg Hx  Fam Hx     Review of Systems:  No other skin or systemic complaints except as noted in HPI or Assessment and Plan.  Objective  Well appearing patient in no apparent distress; mood and affect are within normal limits.  A focused examination was performed including the face and extremities. Relevant physical exam findings are noted in the Assessment and Plan.  B/L leg Clear.   Assessment & Plan  Atopic dermatitis -improving Face, trunk, extremities  Very complicated patient with multiple medical and skin issues.   Biopsy proven - "spongiotic dermatitis" with history of contact dermatitis from Hibiclens - patch test proven   Chronic and persistent condition with duration or expected duration over one year. Condition is symptomatic / bothersome to patient. Not to goal -but much improved today.  Atopic dermatitis (eczema) is a chronic, relapsing, pruritic condition that can significantly affect quality of life. It is often associated with allergic rhinitis and/or asthma and can require treatment with topical medications, phototherapy, or in severe cases biologic injectable medication (Dupixent; Adbry) or Oral JAK inhibitors.  Continue Dupixent '300mg'$ /74m SQ QOW.   Dupixent '300mg'$ /257minjected SQ into the L upper arm post. Patient tolerated injection well. AL, CMA. Dupilumab (Dupixent) is a treatment given by injection for adults and children with  moderate-to-severe atopic dermatitis. Goal is control of skin condition, not cure. It is given as 2 injections at the first dose followed by 1 injection ever 2 weeks thereafter.  Young children are dosed monthly.  Potential side effects include allergic reaction, herpes infections, injection site reactions and conjunctivitis (inflammation of the eyes).  The use of Dupixent requires long term medication management, including periodic office visits.  dupilumab (DUPIXENT) prefilled syringe 300 mg - Face, trunk, extremities  History of MRSA infection B/L leg Culture from 05/23/21 positive for MRSA infection -  Clear today and healed Continue Mupirocin 2% ointment inside the nose QHS on Friday, Saturday, and Sunday.  Recommend using Cln wash when patient takes a shower QW. Samples and brochure given. When patient is able to increase the amount she showers may use Dove Sensitive Skin soap on the other days.   Ulcers of the lower legs Vascular surgeon clinic has patient in Un[place]s. Continue to follow with vascular surgery Return for Dupixent injection with nurse in 2 weeks, follow up with doctor in one month.  I,Luther RedoCMA, am acting as scribe for DaSarina SerMD . Documentation: I have reviewed the above documentation for accuracy and completeness, and I agree with the above.  DaSarina SerMD

## 2021-06-21 ENCOUNTER — Encounter: Payer: Self-pay | Admitting: Dermatology

## 2021-06-24 ENCOUNTER — Encounter (INDEPENDENT_AMBULATORY_CARE_PROVIDER_SITE_OTHER): Payer: Self-pay | Admitting: Nurse Practitioner

## 2021-06-24 ENCOUNTER — Ambulatory Visit (INDEPENDENT_AMBULATORY_CARE_PROVIDER_SITE_OTHER): Payer: Medicare Other | Admitting: Nurse Practitioner

## 2021-06-24 VITALS — BP 130/75 | HR 57 | Resp 16 | Ht 62.0 in | Wt 188.0 lb

## 2021-06-24 DIAGNOSIS — L97221 Non-pressure chronic ulcer of left calf limited to breakdown of skin: Secondary | ICD-10-CM | POA: Diagnosis not present

## 2021-06-24 DIAGNOSIS — I10 Essential (primary) hypertension: Secondary | ICD-10-CM

## 2021-06-24 DIAGNOSIS — I890 Lymphedema, not elsewhere classified: Secondary | ICD-10-CM

## 2021-07-01 ENCOUNTER — Encounter (INDEPENDENT_AMBULATORY_CARE_PROVIDER_SITE_OTHER): Payer: Self-pay

## 2021-07-01 ENCOUNTER — Encounter (INDEPENDENT_AMBULATORY_CARE_PROVIDER_SITE_OTHER): Payer: Medicare Other

## 2021-07-01 ENCOUNTER — Ambulatory Visit (INDEPENDENT_AMBULATORY_CARE_PROVIDER_SITE_OTHER): Payer: Medicare Other | Admitting: Nurse Practitioner

## 2021-07-01 VITALS — BP 102/67 | HR 54 | Resp 17

## 2021-07-01 DIAGNOSIS — L97221 Non-pressure chronic ulcer of left calf limited to breakdown of skin: Secondary | ICD-10-CM

## 2021-07-01 NOTE — Progress Notes (Signed)
History of Present Illness  There is no documented history at this time  Assessments & Plan   There are no diagnoses linked to this encounter.    Additional instructions  Subjective:  Patient presents with venous ulcer of the Bilateral lower extremity.    Procedure:  3 layer unna wrap was placed Bilateral lower extremity.   Plan:   Follow up in one week.  

## 2021-07-06 ENCOUNTER — Ambulatory Visit (INDEPENDENT_AMBULATORY_CARE_PROVIDER_SITE_OTHER): Payer: Medicare Other | Admitting: Dermatology

## 2021-07-06 DIAGNOSIS — L209 Atopic dermatitis, unspecified: Secondary | ICD-10-CM | POA: Diagnosis not present

## 2021-07-06 MED ORDER — DUPILUMAB 300 MG/2ML ~~LOC~~ SOSY
300.0000 mg | PREFILLED_SYRINGE | Freq: Once | SUBCUTANEOUS | Status: AC
  Administered 2021-07-06: 300 mg via SUBCUTANEOUS

## 2021-07-06 NOTE — Progress Notes (Signed)
Patient here today for a two week Dupixent injection for severe atopic dermatitis.  Dupixent '300mg'$ /76m injected into right upper arm. Patient tolerated procedure well.   LEUX:[id number]:09/2023  ADicie BeamRMA  Documentation: I have reviewed the above documentation for accuracy and completeness, and I agree with the above.  TBrendolyn PattyMD

## 2021-07-08 ENCOUNTER — Encounter (INDEPENDENT_AMBULATORY_CARE_PROVIDER_SITE_OTHER): Payer: Self-pay | Admitting: Nurse Practitioner

## 2021-07-08 ENCOUNTER — Ambulatory Visit (INDEPENDENT_AMBULATORY_CARE_PROVIDER_SITE_OTHER): Payer: Medicare Other | Admitting: Nurse Practitioner

## 2021-07-08 VITALS — BP 148/83 | HR 70 | Resp 16 | Wt 186.8 lb

## 2021-07-08 DIAGNOSIS — L97221 Non-pressure chronic ulcer of left calf limited to breakdown of skin: Secondary | ICD-10-CM | POA: Diagnosis not present

## 2021-07-08 NOTE — Progress Notes (Unsigned)
History of Present Illness  There is no documented history at this time  Assessments & Plan   There are no diagnoses linked to this encounter.    Additional instructions  Subjective:  Patient presents with venous ulcer of the Bilateral lower extremity.    Procedure:  3 layer unna wrap was placed Bilateral lower extremity.   Plan:   Follow up in one week.  

## 2021-07-09 ENCOUNTER — Encounter (INDEPENDENT_AMBULATORY_CARE_PROVIDER_SITE_OTHER): Payer: Self-pay | Admitting: Nurse Practitioner

## 2021-07-09 NOTE — Progress Notes (Signed)
Subjective:    Patient ID: Laurie Rice, female    DOB: 24-Oct-1960, 61 y.o.   MRN: [id number] Chief Complaint  Patient presents with   Follow-up    4 week f/u    The patient returns to the office for followup evaluation regarding leg swelling.  The swelling has persisted and the pain associated with swelling continues.   Since the previous visit the patient has been wearing Unna boots.  While the swelling is improved the patient still has some ulcerations and not quite completely healed.  The patient also states elevation during the day and exercise is being done too.       Review of Systems  Skin:  Positive for wound.  All other systems reviewed and are negative.      Objective:   Physical Exam Vitals reviewed.  HENT:     Head: Normocephalic.  Cardiovascular:     Rate and Rhythm: Normal rate.  Pulmonary:     Effort: Pulmonary effort is normal.  Musculoskeletal:     Right lower leg: Edema present.     Left lower leg: Edema present.  Skin:    General: Skin is warm and dry.     Comments: Dermal thickening bilaterally  Neurological:     Mental Status: She is alert and oriented to person, place, and time.  Psychiatric:        Attention and Perception: She is inattentive.        Speech: She is noncommunicative.     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   History of lymphocytosis    Impetigo    History of   Liver masses    History of, Benign   Meningioma (HCC)    Mild mental retardation    Monoclonal gammopathy    low level   Personal history of pulmonary embolism    Seizures (HCC)    Thyroid mass    history of    Social History   Socioeconomic History   Marital status: Single    Spouse  name: Not on file   Number of children: Not on file   Years of education: Not on file   Highest education level: Not on file  Occupational History   Not on file  Tobacco Use   Smoking status: Never   Smokeless tobacco: Never  Substance and Sexual Activity   Alcohol use: No   Drug use: No   Sexual activity: Not on file  Other Topics Concern   Not on file  Social History Narrative   Not on file   Social Determinants of Health   Financial Resource Strain: Not on file  Food Insecurity: Not on file  Transportation Needs: Not on file  Physical Activity: Not on file  Stress: Not on file  Social Connections: Not on file  Intimate Partner Violence: Not on file    Past Surgical History:  Procedure Laterality Date   NO PAST SURGERIES      Family History  Problem Relation Age of Onset   Hypertension Other    Arthritis Other    Breast  cancer Neg Hx     Allergies  Allergen Reactions   Dalbavancin Rash    Widespread rash with no systemic symptoms       Latest Ref Rng & Units 02/28/2021    6:36 AM 02/26/2021    6:10 PM 01/07/2021   10:16 AM  CBC  WBC 4.0 - 10.5 K/uL 9.0  11.0  9.8   Hemoglobin 12.0 - 15.0 g/dL 9.9  11.0  10.8   Hematocrit 36.0 - 46.0 % 32.7  37.3  36.1   Platelets 150 - 400 K/uL 211  283  200       CMP     Component Value Date/Time   NA 142 02/28/2021 0636   NA 139 11/23/2011 1712   K 4.1 02/28/2021 0636   K 4.3 11/23/2011 1712   CL 107 02/28/2021 0636   CL 107 11/23/2011 1712   CO2 27 02/28/2021 0636   CO2 29 11/23/2011 1712   GLUCOSE 81 02/28/2021 0636   GLUCOSE 83 11/23/2011 1712   BUN 10 02/28/2021 0636   BUN 12 11/23/2011 1712   CREATININE 0.61 02/28/2021 0636   CREATININE 0.80 07/17/2012 1123   CALCIUM 7.7 (L) 02/28/2021 0636   CALCIUM 8.9 11/23/2011 1712   PROT 6.3 (L) 02/26/2021 1810   PROT 8.8 (H) 11/23/2011 1712   ALBUMIN 3.3 (L) 02/26/2021 1810   ALBUMIN 3.5 11/23/2011 1712   AST 23 02/26/2021 1810   AST 25 11/23/2011 1712    ALT 15 02/26/2021 1810   ALT 20 11/23/2011 1712   ALKPHOS 73 02/26/2021 1810   ALKPHOS 147 (H) 11/23/2011 1712   BILITOT 0.5 02/26/2021 1810   BILITOT 0.1 (L) 11/23/2011 1712   GFRNONAA >60 02/28/2021 0636   GFRNONAA >60 07/17/2012 1123   GFRAA >60 07/02/2019 1145   GFRAA >60 07/17/2012 1123     No results found.     Assessment & Plan:   1. Lower limb ulcer, calf, left, limited to breakdown of skin ([place]) No surgery or intervention at this point in time.    I have had a long discussion with the patient regarding venous insufficiency and why it  causes symptoms, specifically venous ulceration. I have discussed with the patient the chronic skin changes that accompany venous insufficiency and the long term sequela such as infection and recurring  ulceration.  Patient will be placed in [employer] which will be changed weekly drainage permitting.  In addition, behavioral modification including several periods of elevation of the lower extremities during the day will be continued. Achieving a position with the ankles at heart level was stressed to the patient  The patient is instructed to begin routine exercise, especially walking on a daily basis We will have the patient return in 4 weeks for evaluation  2. Essential hypertension Continue antihypertensive medications as already ordered, these medications have been reviewed and there are no changes at this time.    3. Lymphedema Recommend:  No surgery or intervention at this point in time.    I have reviewed my discussion with the patient regarding lymphedema and why it  causes symptoms.  Patient will continue wearing graduated compression on a daily basis. The patient should put the compression on first thing in the morning and removing them in the evening. The patient should not sleep in the compression.   In addition, behavioral modification throughout the day will be continued.  This will include frequent elevation (such as in  a recliner), use of over the counter  pain medications as needed and exercise such as walking.  The systemic causes for chronic edema such as liver, kidney and cardiac etiologies do not appear to have significant changed over the past year.    Despite conservative treatments including graduated compression therapy class 1 and behavioral modification including exercise and elevation the patient  has not obtained adequate control of the lymphedema.  The patient still has stage 3 lymphedema and therefore, I believe that a lymph pump should be added to improve the control of the patient's lymphedema.  Additionally, a lymph pump is warranted because it will reduce the risk of cellulitis and ulceration in the future.  Patient should follow-up in six months     Current Outpatient Medications on File Prior to Visit  Medication Sig Dispense Refill   Bismuth Tribromoph-Petrolatum (XEROFORM OCCLUSIVE GAUZE PATCH) PADS Apply 1 application topically daily. 50 each 0   bisoprolol-hydrochlorothiazide (ZIAC) 2.5-6.25 MG tablet Take 2 tablets by mouth daily.     dupilumab (DUPIXENT) 300 MG/2ML prefilled syringe Inject 300 mg into the skin every 14 (fourteen) days. Starting at day 15 for maintenance. 4 mL 3   Emollient (CETAPHIL) cream      ferrous sulfate 325 (65 FE) MG EC tablet Take 1 tablet (325 mg total) by mouth daily with breakfast. With orange juice 30 tablet 3   folic acid (FOLVITE) 1 MG tablet TAKE ONE TABLET BY MOUTH EACH DAY.     Gauze Pads & Dressings (KERLIX BANDAGE ROLL 4.5"X9.3') MISC 1 application by Does not apply route daily. 100 each 0   hydrocortisone 2.5 % cream Apply twice daily to abdominal area for two weeks. Apply 2nd 60 g 3   ketoconazole (NIZORAL) 2 % cream Apply twice daily to abdominal area for 2 weeks. Apply 1st 60 g 2   PHENObarbital (LUMINAL) 60 MG tablet Take 60 mg by mouth at bedtime.     phenytoin (DILANTIN) 100 MG ER capsule TAKE 2 CAPSULES BY MOUTH AT BEDTIME (SEIZURE  DISORDER)     phenytoin (DILANTIN) 50 MG tablet Chew 50 mg by mouth at bedtime.     tacrolimus (PROTOPIC) 0.1 % ointment Apply topically 2 (two) times daily. 100 g 0   warfarin (COUMADIN) 1 MG tablet Take 1 tablet (1 mg total) by mouth daily. 30 tablet 11   warfarin (COUMADIN) 1 MG tablet Take 1 tablet (1 mg total) by mouth every Monday, Wednesday, and Friday. Take with (2) 4 mg tablets every Monday, Wednesday, and Friday for a total of 9 mg on those days. 15 tablet 0   warfarin (COUMADIN) 4 MG tablet Take 2 tablets (8 mg total) by mouth daily at 4 PM. Take 2 tablets daily for 8 mg daily. Add (1) 1 mg tablet on MWF for a total of 9 mg on those days. 60 tablet 0   mupirocin ointment (BACTROBAN) 2 % Apply 1 application. topically daily. Daily with dressing change (Patient not taking: Reported on 06/20/2021) 22 g 2   mupirocin ointment (BACTROBAN) 2 % Discontinue all previous orders for Mupirocin and apply to aa's as directed. (Patient not taking: Reported on 06/20/2021) 22 g 3   No current facility-administered medications on file prior to visit.    Patient Instructions  We will have patient continue with Unna Boots until she can have evaluation and possibly obtains authorization. No follow-ups on file.   Kris Hartmann, NP

## 2021-07-12 ENCOUNTER — Inpatient Hospital Stay: Payer: Medicare Other | Attending: Oncology

## 2021-07-12 DIAGNOSIS — Z7901 Long term (current) use of anticoagulants: Secondary | ICD-10-CM | POA: Insufficient documentation

## 2021-07-12 DIAGNOSIS — I87009 Postthrombotic syndrome without complications of unspecified extremity: Secondary | ICD-10-CM | POA: Insufficient documentation

## 2021-07-12 DIAGNOSIS — Z08 Encounter for follow-up examination after completed treatment for malignant neoplasm: Secondary | ICD-10-CM | POA: Diagnosis not present

## 2021-07-12 DIAGNOSIS — E041 Nontoxic single thyroid nodule: Secondary | ICD-10-CM | POA: Insufficient documentation

## 2021-07-12 DIAGNOSIS — D509 Iron deficiency anemia, unspecified: Secondary | ICD-10-CM | POA: Diagnosis not present

## 2021-07-12 DIAGNOSIS — C8510 Unspecified B-cell lymphoma, unspecified site: Secondary | ICD-10-CM

## 2021-07-12 DIAGNOSIS — Z86718 Personal history of other venous thrombosis and embolism: Secondary | ICD-10-CM | POA: Insufficient documentation

## 2021-07-12 DIAGNOSIS — Z8572 Personal history of non-Hodgkin lymphomas: Secondary | ICD-10-CM | POA: Insufficient documentation

## 2021-07-12 LAB — CBC WITH DIFFERENTIAL/PLATELET
Abs Immature Granulocytes: 0.03 10*3/uL (ref 0.00–0.07)
Basophils Absolute: 0.1 10*3/uL (ref 0.0–0.1)
Basophils Relative: 1 %
Eosinophils Absolute: 0.4 10*3/uL (ref 0.0–0.5)
Eosinophils Relative: 4 %
HCT: 39.7 % (ref 36.0–46.0)
Hemoglobin: 11.8 g/dL — ABNORMAL LOW (ref 12.0–15.0)
Immature Granulocytes: 0 %
Lymphocytes Relative: 28 %
Lymphs Abs: 2.7 10*3/uL (ref 0.7–4.0)
MCH: 21.4 pg — ABNORMAL LOW (ref 26.0–34.0)
MCHC: 29.7 g/dL — ABNORMAL LOW (ref 30.0–36.0)
MCV: 71.9 fL — ABNORMAL LOW (ref 80.0–100.0)
Monocytes Absolute: 0.6 10*3/uL (ref 0.1–1.0)
Monocytes Relative: 6 %
Neutro Abs: 5.9 10*3/uL (ref 1.7–7.7)
Neutrophils Relative %: 61 %
Platelets: 243 10*3/uL (ref 150–400)
RBC: 5.52 MIL/uL — ABNORMAL HIGH (ref 3.87–5.11)
RDW: 17.1 % — ABNORMAL HIGH (ref 11.5–15.5)
WBC: 9.7 10*3/uL (ref 4.0–10.5)
nRBC: 0 % (ref 0.0–0.2)

## 2021-07-12 LAB — COMPREHENSIVE METABOLIC PANEL
ALT: 11 U/L (ref 0–44)
AST: 18 U/L (ref 15–41)
Albumin: 3.6 g/dL (ref 3.5–5.0)
Alkaline Phosphatase: 109 U/L (ref 38–126)
Anion gap: 6 (ref 5–15)
BUN: 16 mg/dL (ref 8–23)
CO2: 29 mmol/L (ref 22–32)
Calcium: 8.8 mg/dL — ABNORMAL LOW (ref 8.9–10.3)
Chloride: 104 mmol/L (ref 98–111)
Creatinine, Ser: 0.84 mg/dL (ref 0.44–1.00)
GFR, Estimated: >60 mL/min (ref >60)
Glucose, Bld: 71 mg/dL (ref 70–99)
Potassium: 3.7 mmol/L (ref 3.5–5.1)
Sodium: 139 mmol/L (ref 135–145)
Total Bilirubin: 0.4 mg/dL (ref 0.3–1.2)
Total Protein: 7.8 g/dL (ref 6.5–8.1)

## 2021-07-13 LAB — KAPPA/LAMBDA LIGHT CHAINS
Kappa free light chain: 10.4 mg/L (ref 3.3–19.4)
Kappa, lambda light chain ratio: 0.23 — ABNORMAL LOW (ref 0.26–1.65)
Lambda free light chains: 45.2 mg/L — ABNORMAL HIGH (ref 5.7–26.3)

## 2021-07-14 LAB — VISCOSITY, SERUM: Viscosity, Serum: 1.9 rel.saline (ref 1.4–2.1)

## 2021-07-15 ENCOUNTER — Encounter (INDEPENDENT_AMBULATORY_CARE_PROVIDER_SITE_OTHER): Payer: Self-pay

## 2021-07-15 ENCOUNTER — Ambulatory Visit (INDEPENDENT_AMBULATORY_CARE_PROVIDER_SITE_OTHER): Payer: Medicare Other | Admitting: Nurse Practitioner

## 2021-07-15 VITALS — BP 123/76 | HR 60 | Resp 17

## 2021-07-15 DIAGNOSIS — L97221 Non-pressure chronic ulcer of left calf limited to breakdown of skin: Secondary | ICD-10-CM

## 2021-07-15 NOTE — Progress Notes (Signed)
History of Present Illness  There is no documented history at this time  Assessments & Plan   There are no diagnoses linked to this encounter.    Additional instructions  Subjective:  Patient presents with venous ulcer of the Bilateral lower extremity.    Procedure:  3 layer unna wrap was placed Bilateral lower extremity.   Plan:   Follow up in one week.  

## 2021-07-18 LAB — MULTIPLE MYELOMA PANEL, SERUM
Albumin SerPl Elph-Mcnc: 3.7 g/dL (ref 2.9–4.4)
Albumin/Glob SerPl: 1 (ref 0.7–1.7)
Alpha 1: 0.2 g/dL (ref 0.0–0.4)
Alpha2 Glob SerPl Elph-Mcnc: 0.9 g/dL (ref 0.4–1.0)
B-Globulin SerPl Elph-Mcnc: 0.8 g/dL (ref 0.7–1.3)
Gamma Glob SerPl Elph-Mcnc: 2 g/dL — ABNORMAL HIGH (ref 0.4–1.8)
Globulin, Total: 3.9 g/dL (ref 2.2–3.9)
IgA: 82 mg/dL — ABNORMAL LOW (ref 87–352)
IgG (Immunoglobin G), Serum: 767 mg/dL (ref 586–1602)
IgM (Immunoglobulin M), Srm: 2088 mg/dL — ABNORMAL HIGH (ref 26–217)
M Protein SerPl Elph-Mcnc: 1.4 g/dL — ABNORMAL HIGH
Total Protein ELP: 7.6 g/dL (ref 6.0–8.5)

## 2021-07-19 ENCOUNTER — Ambulatory Visit: Payer: Medicare Other | Admitting: Oncology

## 2021-07-20 ENCOUNTER — Ambulatory Visit: Payer: Medicare Other | Admitting: Dermatology

## 2021-07-22 ENCOUNTER — Encounter (INDEPENDENT_AMBULATORY_CARE_PROVIDER_SITE_OTHER): Payer: Self-pay

## 2021-07-22 ENCOUNTER — Ambulatory Visit (INDEPENDENT_AMBULATORY_CARE_PROVIDER_SITE_OTHER): Payer: Medicare Other | Admitting: Nurse Practitioner

## 2021-07-22 VITALS — BP 134/70 | HR 76 | Resp 17

## 2021-07-22 DIAGNOSIS — L97221 Non-pressure chronic ulcer of left calf limited to breakdown of skin: Secondary | ICD-10-CM

## 2021-07-22 NOTE — Progress Notes (Signed)
History of Present Illness  There is no documented history at this time  Assessments & Plan   There are no diagnoses linked to this encounter.    Additional instructions  Subjective:  Patient presents with venous ulcer of the Bilateral lower extremity.    Procedure:  3 layer unna wrap was placed Bilateral lower extremity.   Plan:   Follow up in one week.  

## 2021-07-26 ENCOUNTER — Encounter: Payer: Self-pay | Admitting: Oncology

## 2021-07-26 ENCOUNTER — Inpatient Hospital Stay (HOSPITAL_BASED_OUTPATIENT_CLINIC_OR_DEPARTMENT_OTHER): Payer: Medicare Other | Admitting: Oncology

## 2021-07-26 VITALS — BP 117/52 | HR 61 | Temp 97.9°F | Wt 189.0 lb

## 2021-07-26 DIAGNOSIS — C8510 Unspecified B-cell lymphoma, unspecified site: Secondary | ICD-10-CM | POA: Diagnosis not present

## 2021-07-26 DIAGNOSIS — E041 Nontoxic single thyroid nodule: Secondary | ICD-10-CM | POA: Diagnosis not present

## 2021-07-26 DIAGNOSIS — D509 Iron deficiency anemia, unspecified: Secondary | ICD-10-CM | POA: Diagnosis not present

## 2021-07-26 DIAGNOSIS — I87009 Postthrombotic syndrome without complications of unspecified extremity: Secondary | ICD-10-CM

## 2021-07-26 DIAGNOSIS — Z08 Encounter for follow-up examination after completed treatment for malignant neoplasm: Secondary | ICD-10-CM | POA: Diagnosis not present

## 2021-07-26 NOTE — Progress Notes (Signed)
Hematology/Oncology Progress note [phone] [phone]      Patient Care Team: Juluis Pitch, MD as PCP - General (Family Medicine)  REFERRING PROVIDER: Juluis Pitch, MD  CHIEF COMPLAINTS/REASON FOR VISIT:  Follow-up for lymphoma  HISTORY OF PRESENTING ILLNESS:  Laurie Rice is a  61 y.o.  female with PMH listed below follows up for lymphoma 06/10/2017 CBC showed elevated white count of 38.5, hemoglobin 10.2, MCV 74.7, absolute lymphocyte 30.4, basophil 0.13, Previous lab records reviewed. Leukocytosis onset of chronic, duration is since at least 2014 Anemia is also chronic since at least 2016 No aggravating or elevated factors. Patient is a poor historian.  She has a seizure disorder and meningioma.  She is not medical competent and has legal guardian.  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    Associated symptoms or signs:  Denies weight loss, fever, chills,night sweats.  Recent history of weight loss about 10 pounds over the past 1 year.  Smoking history: Never smoking History of recent oral steroid use or steroid injection: Denies History of recent infection: Denies Autoimmune disease history.  Denies  Reviewed patient's past medical history, was previously seen by Dr. Mike Gip with last visit more than 3 years ago.  Today she wants to reestablish care at [facility] site. 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  Phenotype did not suggest a specific histologic type and was not typical for CLL/SLL, mantle cell lymphoma, hairy cell leukemia or follicular cell lymphoma.  BCR ABL testing was negative.  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  Chronic microcytic anemia since 2008.  She had hemoglobin electrophoresis reviewed normal hemoglobin F and A2 Patient has a history of bilateral DVT and pulmonary  embolism in 2010.  Has been on Coumadin chronically She developed right lower extremity DVT on 07/09/2014 secondary to subtherapeutic INR  01/30/2020- 02/20/2020 weekly rituximab treatment x 4  INTERVAL HISTORY Laurie Rice is a 61 y.o. female who has above history reviewed by me today presents for follow up visit for lymphoma. Patient has increasing right lower extremity edema, hyperpigmentation.  Patient has been following up by dermatologist with a work-up diagnosis of atopic dermatitis.  Patient is on Dupixent injections. Chronic lower extremity edema secondary to venous insufficiency, lower limb ulcer. Patient is on Coumadin for history of right lower extremity DVT, managed by primary care provider.  Patient was accompanied by caregiver today.  .Review of Systems  Unable to perform ROS: Other (Intellectual disability)  Constitutional:  Positive for unexpected weight change. Negative for fatigue.   Leg swelling.   MEDICAL HISTORY:  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   History of lymphocytosis    Impetigo    History of   Liver masses    History of, Benign   Meningioma (HCC)    Mild mental retardation    Monoclonal gammopathy    low level   Personal history of pulmonary embolism    Seizures (HCC)    Thyroid mass    history of    SURGICAL HISTORY: Past Surgical History:  Procedure Laterality Date   NO PAST SURGERIES      SOCIAL HISTORY: Social History   Socioeconomic History   Marital  status: Single    Spouse name: Not on file   Number of children: Not on file   Years of education: Not on file   Highest education level: Not on file  Occupational History   Not on file  Tobacco Use   Smoking status: Never   Smokeless tobacco: Never  Substance and Sexual Activity   Alcohol use: No   Drug use: No   Sexual activity: Not on  file  Other Topics Concern   Not on file  Social History Narrative   Not on file   Social Determinants of Health   Financial Resource Strain: Not on file  Food Insecurity: Not on file  Transportation Needs: Not on file  Physical Activity: Not on file  Stress: Not on file  Social Connections: Not on file  Intimate Partner Violence: Not on file    FAMILY HISTORY: Family History  Problem Relation Age of Onset   Hypertension Other    Arthritis Other    Breast cancer Neg Hx     ALLERGIES:  is allergic to dalbavancin.  MEDICATIONS:  Current Outpatient Medications  Medication Sig Dispense Refill   Bismuth Tribromoph-Petrolatum (XEROFORM OCCLUSIVE GAUZE PATCH) PADS Apply 1 application topically daily. 50 each 0   bisoprolol-hydrochlorothiazide (ZIAC) 2.5-6.25 MG tablet Take 2 tablets by mouth daily.     dupilumab (DUPIXENT) 300 MG/2ML prefilled syringe Inject 300 mg into the skin every 14 (fourteen) days. Starting at day 15 for maintenance. 4 mL 3   Emollient (CETAPHIL) cream      ferrous sulfate 325 (65 FE) MG EC tablet Take 1 tablet (325 mg total) by mouth daily with breakfast. With orange juice 30 tablet 3   folic acid (FOLVITE) 1 MG tablet TAKE ONE TABLET BY MOUTH EACH DAY.     Gauze Pads & Dressings (KERLIX BANDAGE ROLL 4.5"X9.3') MISC 1 application by Does not apply route daily. 100 each 0   hydrocortisone 2.5 % cream Apply twice daily to abdominal area for two weeks. Apply 2nd 60 g 3   ketoconazole (NIZORAL) 2 % cream Apply twice daily to abdominal area for 2 weeks. Apply 1st 60 g 2   mupirocin ointment (BACTROBAN) 2 % Discontinue all previous orders for Mupirocin and apply to aa's as directed. 22 g 3   PHENObarbital (LUMINAL) 60 MG tablet Take 60 mg by mouth at bedtime.     phenytoin (DILANTIN) 100 MG ER capsule TAKE 2 CAPSULES BY MOUTH AT BEDTIME (SEIZURE DISORDER)     phenytoin (DILANTIN) 50 MG tablet Chew 50 mg by mouth at bedtime.     tacrolimus (PROTOPIC) 0.1 %  ointment Apply topically 2 (two) times daily. 100 g 0   triamcinolone ointment (KENALOG) 0.1 % Apply 1 Application topically 2 (two) times daily.     warfarin (COUMADIN) 1 MG tablet Take 1 tablet (1 mg total) by mouth every Monday, Wednesday, and Friday. Take with (2) 4 mg tablets every Monday, Wednesday, and Friday for a total of 9 mg on those days. 15 tablet 0   warfarin (COUMADIN) 4 MG tablet Take 2 tablets (8 mg total) by mouth daily at 4 PM. Take 2 tablets daily for 8 mg daily. Add (1) 1 mg tablet on MWF for a total of 9 mg on those days. 60 tablet 0   mupirocin ointment (BACTROBAN) 2 % Apply 1 application. topically daily. Daily with dressing change (Patient not taking: Reported on 07/26/2021) 22 g 2   warfarin (COUMADIN) 1 MG tablet Take 1  tablet (1 mg total) by mouth daily. (Patient not taking: Reported on 07/26/2021) 30 tablet 11   No current facility-administered medications for this visit.     PHYSICAL EXAMINATION: ECOG PERFORMANCE STATUS: 1 - Symptomatic but completely ambulatory Vitals:   07/26/21 1338  BP: (!) 117/52  Pulse: 61  Temp: 97.9 F (36.6 C)   Filed Weights   07/26/21 1338  Weight: 189 lb (85.7 kg)    Physical Exam Constitutional:      General: She is not in acute distress. HENT:     Head: Normocephalic and atraumatic.  Eyes:     General: No scleral icterus. Cardiovascular:     Rate and Rhythm: Normal rate and regular rhythm.     Heart sounds: Normal heart sounds.  Pulmonary:     Effort: Pulmonary effort is normal. No respiratory distress.     Breath sounds: No wheezing.  Abdominal:     General: Bowel sounds are normal. There is no distension.     Palpations: Abdomen is soft.  Musculoskeletal:        General: Swelling present. No deformity. Normal range of motion.     Cervical back: Normal range of motion and neck supple.     Comments: Bilateral lower extremity swelling, right worse than left.  Skin:    General: Skin is warm and dry.     Findings:  No erythema or rash.     Comments: hyperpigmentation changes of lower extremities and multiple areas of low back  Neurological:     Mental Status: She is alert. Mental status is at baseline.  Psychiatric:        Mood and Affect: Mood normal.        Latest Ref Rng & Units 07/12/2021   10:48 AM  CMP  Glucose 70 - 99 mg/dL 71   BUN 8 - 23 mg/dL 16   Creatinine 0.44 - 1.00 mg/dL 0.84   Sodium 135 - 145 mmol/L 139   Potassium 3.5 - 5.1 mmol/L 3.7   Chloride 98 - 111 mmol/L 104   CO2 22 - 32 mmol/L 29   Calcium 8.9 - 10.3 mg/dL 8.8   Total Protein 6.5 - 8.1 g/dL 7.8   Total Bilirubin 0.3 - 1.2 mg/dL 0.4   Alkaline Phos 38 - 126 U/L 109   AST 15 - 41 U/L 18   ALT 0 - 44 U/L 11       Latest Ref Rng & Units 07/12/2021   10:48 AM  CBC  WBC 4.0 - 10.5 K/uL 9.7   Hemoglobin 12.0 - 15.0 g/dL 11.8   Hematocrit 36.0 - 46.0 % 39.7   Platelets 150 - 400 K/uL 243      RADIOGRAPHIC STUDIES: I have personally reviewed the radiological images as listed and agreed with the findings in the report. No results found.  LABORATORY DATA:  I have reviewed the data as listed    Latest Ref Rng & Units 07/12/2021   10:48 AM 02/28/2021    6:36 AM 02/26/2021    6:10 PM  CBC  WBC 4.0 - 10.5 K/uL 9.7  9.0  11.0   Hemoglobin 12.0 - 15.0 g/dL 11.8  9.9  11.0   Hematocrit 36.0 - 46.0 % 39.7  32.7  37.3   Platelets 150 - 400 K/uL 243  211  283     Recent Labs    01/07/21 1016 02/26/21 1810 02/28/21 0636 07/12/21 1048  NA 138 140 142 139  K 3.5 4.4  4.1 3.7  CL 105 104 107 104  CO2 '26 27 27 29  ' GLUCOSE 82 103* 81 71  BUN '16 16 10 16  ' CREATININE 0.75 0.71 0.61 0.84  CALCIUM 8.4* 8.6* 7.7* 8.8*  GFRNONAA >60 >60 >60 >60  PROT 7.2 6.3*  --  7.8  ALBUMIN 3.5 3.3*  --  3.6  AST 18 23  --  18  ALT 14 15  --  11  ALKPHOS 92 73  --  109  BILITOT 0.4 0.5  --  0.4    Iron/TIBC/Ferritin/ %Sat    Component Value Date/Time   IRON 99 02/13/2020 0808   IRON 94 08/22/2012 1142   TIBC 196 (L)  02/13/2020 0808   TIBC 221 (L) 08/22/2012 1142   FERRITIN 112 02/13/2020 0808   FERRITIN 65 08/22/2012 1142   IRONPCTSAT 51 (H) 02/13/2020 0808   IRONPCTSAT 43 08/22/2012 1142        ASSESSMENT & PLAN:  1. Thyroid nodule   2. Low grade B-cell lymphoma ([place])   3. Microcytic anemia   4. Postphlebitic syndrome    #Low-grade non-Hodgkin B-cell lymphoma, MYD negative, t (4;6) IgM gammopathy,IgM level 3366, baseline M protein was elevated at 2.1. Bone marrow biopsy reveals marked involvement of lymphoma.   S/p 4 rituximab treatments.  Lambda light chain is stable.  Ratio is stable.  M protein 1.4, stable. Continue observation.  #chronic microcytic anemia, hemoglobin is 11.8, improved.  Microcytic check alpha thalssemia PCR #chronic lower extremity,Skin hyperpigmentation, dermatitis changes.   lymphedema or vein insufficiency due to previous DVT/post thrombotic syndrome.  Continue follow-up with vascular surgeon and dermatology.  #Thyroid nodule, will obtain thyroid ultrasound for further evaluation.   Orders Placed This Encounter  Procedures   US THYROID    Standing Status:   Future    Standing Expiration Date:   07/27/2022    Order Specific Question:   Reason for Exam (SYMPTOM  OR DIAGNOSIS REQUIRED)    Answer:   thyroid nodule    Order Specific Question:   Preferred imaging location?    Answer:   [facility]   CBC with Differential/Platelet    Standing Status:   Future    Standing Expiration Date:   07/27/2022   Comprehensive metabolic panel    Standing Status:   Future    Standing Expiration Date:   07/27/2022   Kappa/lambda light chains    Standing Status:   Future    Standing Expiration Date:   07/27/2022   Multiple Myeloma Panel (SPEP&IFE w/QIG)    Standing Status:   Future    Standing Expiration Date:   07/27/2022   Viscosity, serum    Standing Status:   Future    Standing Expiration Date:   07/27/2022   Alpha-Thalassemia GenotypR    Standing Status:   Future     Standing Expiration Date:   07/27/2022    All questions were answered. The patient knows to call the clinic with any problems questions or concerns.  Return of visit: 6 months  Earlie Server, MD, PhD  07/26/2021

## 2021-07-28 ENCOUNTER — Ambulatory Visit: Payer: Medicare Other | Admitting: Dermatology

## 2021-07-29 ENCOUNTER — Ambulatory Visit (INDEPENDENT_AMBULATORY_CARE_PROVIDER_SITE_OTHER): Payer: Medicare Other | Admitting: Nurse Practitioner

## 2021-07-29 ENCOUNTER — Encounter (INDEPENDENT_AMBULATORY_CARE_PROVIDER_SITE_OTHER): Payer: Self-pay | Admitting: Nurse Practitioner

## 2021-07-29 VITALS — BP 154/78 | HR 54 | Resp 16 | Wt 184.6 lb

## 2021-07-29 DIAGNOSIS — I10 Essential (primary) hypertension: Secondary | ICD-10-CM | POA: Diagnosis not present

## 2021-07-29 DIAGNOSIS — I890 Lymphedema, not elsewhere classified: Secondary | ICD-10-CM | POA: Diagnosis not present

## 2021-07-29 NOTE — Progress Notes (Signed)
Subjective:    Patient ID: Laurie Rice, female    DOB: 18-Jan-1960, 61 y.o.   MRN: [id number] Chief Complaint  Patient presents with   Follow-up    Unna boot follow up    The patient returns to the office for followup evaluation regarding leg swelling.  The swelling has persisted and the pain associated with swelling continues.    Since the previous visit the patient has been wearing Unna boots.  While the swelling is improved the patient still has some ulcerations and not quite completely healed.   The patient also states elevation during the day and exercise is being done too.     Review of Systems  Cardiovascular:  Positive for leg swelling.  Psychiatric/Behavioral:  Positive for confusion.   All other systems reviewed and are negative.      Objective:   Physical Exam Vitals reviewed.  HENT:     Head: Normocephalic.  Cardiovascular:     Rate and Rhythm: Normal rate.  Pulmonary:     Effort: Pulmonary effort is normal.  Musculoskeletal:     Right lower leg: Edema present.     Left lower leg: Edema present.  Skin:    General: Skin is warm and dry.  Neurological:     Mental Status: She is alert and oriented to person, place, and time.  Psychiatric:        Mood and Affect: Mood normal.        Behavior: Behavior normal.        Thought Content: Thought content normal.        Judgment: Judgment normal.     BP (!) 154/78 (BP Location: Left Arm)   Pulse (!) 54   Resp 16   Wt 184 lb 9.6 oz (83.7 kg)   BMI 33.76 kg/m   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   History of lymphocytosis    Impetigo    History of   Liver masses    History of, Benign   Meningioma (HCC)    Mild mental retardation    Monoclonal gammopathy    low level   Personal history of pulmonary embolism    Seizures (HCC)    Thyroid mass    history of     Social History   Socioeconomic History   Marital status: Single    Spouse name: Not on file   Number of children: Not on file   Years of education: Not on file   Highest education level: Not on file  Occupational History   Not on file  Tobacco Use   Smoking status: Never   Smokeless tobacco: Never  Substance and Sexual Activity   Alcohol use: No   Drug use: No   Sexual activity: Not on file  Other Topics Concern   Not on file  Social History Narrative   Not on file   Social Determinants of Health   Financial Resource Strain: Not on file  Food Insecurity: Not on file  Transportation Needs: Not on file  Physical Activity: Not on file  Stress: Not on file  Social Connections: Not on file  Intimate Partner Violence: Not on file    Past Surgical History:  Procedure Laterality Date   NO PAST SURGERIES      Family History  Problem  Relation Age of Onset   Hypertension Other    Arthritis Other    Breast cancer Neg Hx     Allergies  Allergen Reactions   Dalbavancin Rash    Widespread rash with no systemic symptoms       Latest Ref Rng & Units 07/12/2021   10:48 AM 02/28/2021    6:36 AM 02/26/2021    6:10 PM  CBC  WBC 4.0 - 10.5 K/uL 9.7  9.0  11.0   Hemoglobin 12.0 - 15.0 g/dL 11.8  9.9  11.0   Hematocrit 36.0 - 46.0 % 39.7  32.7  37.3   Platelets 150 - 400 K/uL 243  211  283       CMP     Component Value Date/Time   NA 139 07/12/2021 1048   NA 139 11/23/2011 1712   K 3.7 07/12/2021 1048   K 4.3 11/23/2011 1712   CL 104 07/12/2021 1048   CL 107 11/23/2011 1712   CO2 29 07/12/2021 1048   CO2 29 11/23/2011 1712   GLUCOSE 71 07/12/2021 1048   GLUCOSE 83 11/23/2011 1712   BUN 16 07/12/2021 1048   BUN 12 11/23/2011 1712   CREATININE 0.84 07/12/2021 1048   CREATININE 0.80 07/17/2012 1123   CALCIUM 8.8 (L) 07/12/2021 1048   CALCIUM 8.9 11/23/2011 1712   PROT 7.8 07/12/2021 1048   PROT 8.8 (H) 11/23/2011 1712   ALBUMIN 3.6 07/12/2021 1048    ALBUMIN 3.5 11/23/2011 1712   AST 18 07/12/2021 1048   AST 25 11/23/2011 1712   ALT 11 07/12/2021 1048   ALT 20 11/23/2011 1712   ALKPHOS 109 07/12/2021 1048   ALKPHOS 147 (H) 11/23/2011 1712   BILITOT 0.4 07/12/2021 1048   BILITOT 0.1 (L) 11/23/2011 1712   GFRNONAA >60 07/12/2021 1048   GFRNONAA >60 07/17/2012 1123   GFRAA >60 07/02/2019 1145   GFRAA >60 07/17/2012 1123     No results found.     Assessment & Plan:   1. Lymphedema The patient has been approved for lymphedema pump and is in the process of obtaining 1.  We will keep the patient in Unna boots until she receives her pump as I believe this will be what helps the patient maintain her swelling.  Patient return next week for Unna boots. 2. Essential hypertension Continue antihypertensive medications as already ordered, these medications have been reviewed and there are no changes at this time.    Current Outpatient Medications on File Prior to Visit  Medication Sig Dispense Refill   Bismuth Tribromoph-Petrolatum (XEROFORM OCCLUSIVE GAUZE PATCH) PADS Apply 1 application topically daily. 50 each 0   bisoprolol-hydrochlorothiazide (ZIAC) 2.5-6.25 MG tablet Take 2 tablets by mouth daily.     dupilumab (DUPIXENT) 300 MG/2ML prefilled syringe Inject 300 mg into the skin every 14 (fourteen) days. Starting at day 15 for maintenance. 4 mL 3   Emollient (CETAPHIL) cream      ferrous sulfate 325 (65 FE) MG EC tablet Take 1 tablet (325 mg total) by mouth daily with breakfast. With orange juice 30 tablet 3   folic acid (FOLVITE) 1 MG tablet TAKE ONE TABLET BY MOUTH EACH DAY.     Gauze Pads & Dressings (KERLIX BANDAGE ROLL 4.5"X9.3') MISC 1 application by Does not apply route daily. 100 each 0   hydrocortisone 2.5 % cream Apply twice daily to abdominal area for two weeks. Apply 2nd 60 g 3   ketoconazole (NIZORAL) 2 % cream Apply twice daily to  abdominal area for 2 weeks. Apply 1st 60 g 2   mupirocin ointment (BACTROBAN) 2 %  Discontinue all previous orders for Mupirocin and apply to aa's as directed. 22 g 3   PHENObarbital (LUMINAL) 60 MG tablet Take 60 mg by mouth at bedtime.     phenytoin (DILANTIN) 100 MG ER capsule TAKE 2 CAPSULES BY MOUTH AT BEDTIME (SEIZURE DISORDER)     phenytoin (DILANTIN) 50 MG tablet Chew 50 mg by mouth at bedtime.     tacrolimus (PROTOPIC) 0.1 % ointment Apply topically 2 (two) times daily. 100 g 0   triamcinolone ointment (KENALOG) 0.1 % Apply 1 Application topically 2 (two) times daily.     warfarin (COUMADIN) 1 MG tablet Take 1 tablet (1 mg total) by mouth every Monday, Wednesday, and Friday. Take with (2) 4 mg tablets every Monday, Wednesday, and Friday for a total of 9 mg on those days. 15 tablet 0   warfarin (COUMADIN) 4 MG tablet Take 2 tablets (8 mg total) by mouth daily at 4 PM. Take 2 tablets daily for 8 mg daily. Add (1) 1 mg tablet on MWF for a total of 9 mg on those days. 60 tablet 0   mupirocin ointment (BACTROBAN) 2 % Apply 1 application. topically daily. Daily with dressing change (Patient not taking: Reported on 07/26/2021) 22 g 2   warfarin (COUMADIN) 1 MG tablet Take 1 tablet (1 mg total) by mouth daily. (Patient not taking: Reported on 07/26/2021) 30 tablet 11   No current facility-administered medications on file prior to visit.    There are no Patient Instructions on file for this visit. No follow-ups on file.   Kris Hartmann, NP

## 2021-07-31 ENCOUNTER — Encounter (INDEPENDENT_AMBULATORY_CARE_PROVIDER_SITE_OTHER): Payer: Self-pay | Admitting: Nurse Practitioner

## 2021-08-04 ENCOUNTER — Other Ambulatory Visit: Payer: Self-pay | Admitting: Family Medicine

## 2021-08-04 DIAGNOSIS — Z1231 Encounter for screening mammogram for malignant neoplasm of breast: Secondary | ICD-10-CM

## 2021-08-05 ENCOUNTER — Encounter (INDEPENDENT_AMBULATORY_CARE_PROVIDER_SITE_OTHER): Payer: Self-pay

## 2021-08-05 ENCOUNTER — Ambulatory Visit (INDEPENDENT_AMBULATORY_CARE_PROVIDER_SITE_OTHER): Payer: Medicare Other | Admitting: Nurse Practitioner

## 2021-08-05 VITALS — BP 141/80 | HR 54 | Resp 17

## 2021-08-05 DIAGNOSIS — I890 Lymphedema, not elsewhere classified: Secondary | ICD-10-CM | POA: Diagnosis not present

## 2021-08-05 NOTE — Progress Notes (Signed)
History of Present Illness  There is no documented history at this time  Assessments & Plan   There are no diagnoses linked to this encounter.    Additional instructions  Subjective:  Patient presents with venous ulcer of the Bilateral lower extremity.    Procedure:  3 layer unna wrap was placed Bilateral lower extremity.   Plan:   Follow up in one week.  

## 2021-08-09 ENCOUNTER — Encounter: Payer: Self-pay | Admitting: Oncology

## 2021-08-09 LAB — ALPHA-THALASSEMIA GENOTYPR

## 2021-08-12 ENCOUNTER — Encounter (INDEPENDENT_AMBULATORY_CARE_PROVIDER_SITE_OTHER): Payer: Self-pay

## 2021-08-12 ENCOUNTER — Ambulatory Visit (INDEPENDENT_AMBULATORY_CARE_PROVIDER_SITE_OTHER): Payer: Medicare Other | Admitting: Nurse Practitioner

## 2021-08-12 VITALS — BP 119/79 | HR 65 | Resp 17

## 2021-08-12 DIAGNOSIS — I890 Lymphedema, not elsewhere classified: Secondary | ICD-10-CM | POA: Diagnosis not present

## 2021-08-12 NOTE — Progress Notes (Unsigned)
History of Present Illness  There is no documented history at this time  Assessments & Plan   There are no diagnoses linked to this encounter.    Additional instructions  Subjective:  Patient presents with venous ulcer of the Bilateral lower extremity.    Procedure:  3 layer unna wrap was placed Bilateral lower extremity.   Plan:   Follow up in one week.  

## 2021-08-13 ENCOUNTER — Encounter (INDEPENDENT_AMBULATORY_CARE_PROVIDER_SITE_OTHER): Payer: Self-pay | Admitting: Nurse Practitioner

## 2021-08-17 ENCOUNTER — Ambulatory Visit (INDEPENDENT_AMBULATORY_CARE_PROVIDER_SITE_OTHER): Payer: Medicare Other | Admitting: Dermatology

## 2021-08-17 DIAGNOSIS — L2389 Allergic contact dermatitis due to other agents: Secondary | ICD-10-CM

## 2021-08-17 DIAGNOSIS — S81811A Laceration without foreign body, right lower leg, initial encounter: Secondary | ICD-10-CM | POA: Diagnosis not present

## 2021-08-17 DIAGNOSIS — T1490XA Injury, unspecified, initial encounter: Secondary | ICD-10-CM

## 2021-08-17 DIAGNOSIS — Z79899 Other long term (current) drug therapy: Secondary | ICD-10-CM | POA: Diagnosis not present

## 2021-08-17 DIAGNOSIS — L2081 Atopic neurodermatitis: Secondary | ICD-10-CM

## 2021-08-17 MED ORDER — DUPILUMAB 300 MG/2ML ~~LOC~~ SOSY
300.0000 mg | PREFILLED_SYRINGE | Freq: Once | SUBCUTANEOUS | Status: AC
  Administered 2021-08-17: 300 mg via SUBCUTANEOUS

## 2021-08-17 NOTE — Patient Instructions (Addendum)
Traumatic Injury R lower leg Wound cleansed with Puracyn, followed by Mupirocin oint, nonstick telfa and coban   Atopic NeuroDermatitis Much improved Restart Dupixent '300mg'$ /27m sq injections every 2 weeks Dupixent '300mg'$ /247msq injection today to L upper arm, Lot [id number] 09/2023         Due to recent changes in healthcare laws, you may see results of your pathology and/or laboratory studies on MyChart before the doctors have had a chance to review them. We understand that in some cases there may be results that are confusing or concerning to you. Please understand that not all results are received at the same time and often the doctors may need to interpret multiple results in order to provide you with the best plan of care or course of treatment. Therefore, we ask that you please give us[place]orea business days to thoroughly review all your results before contacting the office for clarification. Should we see a critical lab result, you will be contacted sooner.   If You Need Anything After Your Visit  If you have any questions or concerns for your doctor, please call our main line at [phone] press option 4 to reach your doctor's medical assistant. If no one answers, please leave a voicemail as directed and we will return your call as soon as possible. Messages left after 4 pm will be answered the following business day.   You may also send [place] message via [place] typically respond to MyChart messages within 1-2 business days.  For prescription refills, please ask your pharmacy to contact our office. Our fax number is [phone] If you have an urgent issue when the clinic is closed that cannot wait until the next business day, you can page your doctor at the number below.    Please note that while we do our best to be available for urgent issues outside of office hours, we are not available 24/7.   If you have an urgent issue and are unable to reach usKoreayou may choose to  seek medical care at your doctor's office, retail clinic, urgent care center, or emergency room.  If you have a medical emergency, please immediately call 911 or go to the emergency department.  Pager Numbers  - Dr. KoNehemiah [phone]- Dr. MoLaurence [phone]- Dr. StNicole [phone] the event of inclement weather, please call our main line at [phone] an update on the status of any delays or closures.  Dermatology Medication Tips: Please keep the boxes that topical medications come in in order to help keep track of the instructions about where and how to use these. Pharmacies typically print the medication instructions only on the boxes and not directly on the medication tubes.   If your medication is too expensive, please contact our office at [phone] 4 or send us[place] message through My[place]  We are unable to tell what your co-pay for medications will be in advance as this is different depending on your insurance coverage. However, we may be able to find a substitute medication at lower cost or fill out paperwork to get insurance to cover a needed medication.   If a prior authorization is required to get your medication covered by your insurance company, please allow usKorea-2 business days to complete this process.  Drug prices often vary depending on where the prescription is filled and some pharmacies may offer cheaper prices.  The website www.goodrx.com contains coupons for medications through  through different pharmacies. The prices here do not account for what the cost may be with help from insurance (it may be cheaper with your insurance), but the website can give you the price if you did not use any insurance.  - You can print the associated coupon and take it with your prescription to the pharmacy.  - You may also stop by our office during regular business hours and pick up a GoodRx coupon card.  - If you need your prescription sent electronically to a  different pharmacy, notify our office through [place] MyChart or by phone at [phone] option 4.     Si Usted Necesita Algo Despus de Su Visita  Tambin puede enviarnos un mensaje a travs de MyChart. Por lo general respondemos a los mensajes de MyChart en el transcurso de 1 a 2 das hbiles.  Para renovar recetas, por favor pida a su farmacia que se ponga en contacto con nuestra oficina. Nuestro nmero de fax es el [phone].  Si tiene un asunto urgente cuando la clnica est cerrada y que no puede esperar hasta el siguiente da hbil, puede llamar/localizar a su doctor(a) al nmero que aparece a continuacin.   Por favor, tenga en cuenta que aunque hacemos todo lo posible para estar disponibles para asuntos urgentes fuera del horario de oficina, no estamos disponibles las 24 horas del da, los 7 das de la semana.   Si tiene un problema urgente y no puede comunicarse con nosotros, puede optar por buscar atencin mdica  en el consultorio de su doctor(a), en una clnica privada, en un centro de atencin urgente o en una sala de emergencias.  Si tiene una emergencia mdica, por favor llame inmediatamente al 911 o vaya a la sala de emergencias.  Nmeros de bper  - Dr. Kowalski: [phone]  - Dra. Moye: [phone]  - Dra. Stewart: [phone]  En caso de inclemencias del tiempo, por favor llame a nuestra lnea principal al [phone] para una actualizacin sobre el estado de cualquier retraso o cierre.  Consejos para la medicacin en dermatologa: Por favor, guarde las cajas en las que vienen los medicamentos de uso tpico para ayudarle a seguir las instrucciones sobre dnde y cmo usarlos. Las farmacias generalmente imprimen las instrucciones del medicamento slo en las cajas y no directamente en los tubos del medicamento.   Si su medicamento es muy caro, por favor, pngase en contacto con nuestra oficina llamando al [phone] y presione la opcin 4 o envenos un  mensaje a travs de MyChart.   No podemos decirle cul ser su copago por los medicamentos por adelantado ya que esto es diferente dependiendo de la cobertura de su seguro. Sin embargo, es posible que podamos encontrar un medicamento sustituto a menor costo o llenar un formulario para que el seguro cubra el medicamento que se considera necesario.   Si se requiere una autorizacin previa para que su compaa de seguros cubra su medicamento, por favor permtanos de 1 a 2 das hbiles para completar este proceso.  Los precios de los medicamentos varan con frecuencia dependiendo del lugar de dnde se surte la receta y alguna farmacias pueden ofrecer precios ms baratos.  El sitio web www.goodrx.com tiene cupones para medicamentos de diferentes farmacias. Los precios aqu no tienen en cuenta lo que podra costar con la ayuda del seguro (puede ser ms barato con su seguro), pero el sitio web puede darle el precio si no utiliz ningn seguro.  - Puede imprimir el cupn correspondiente y llevarlo   su receta a la farmacia.  - Tambin puede pasar por nuestra oficina durante el horario de atencin regular y [employer] fundraiser una tarjeta de cupones de GoodRx.  - Si necesita que su receta se enve electrnicamente a una farmacia diferente, informe a nuestra oficina a travs de MyChart de [place] o por telfono llamando al [phone] y presione la opcin 4.

## 2021-08-17 NOTE — Progress Notes (Signed)
Follow-Up Visit   Subjective  Laurie Rice is a 61 y.o. female who presents for the following: open lesion (R lower leg, pt said last week when they took unna boot off before her appt with [facility], her leg had open sore, then went to [facility] and they reapplied the [employer]).  Patient accompanied by caregiver who contributes to history.  The following portions of the chart were reviewed this encounter and updated as appropriate:   Tobacco  Allergies  Meds  Problems  Med Hx  Surg Hx  Fam Hx     Review of Systems:  No other skin or systemic complaints except as noted in HPI or Assessment and Plan.  Objective  Well appearing patient in no apparent distress; mood and affect are within normal limits.  A focused examination was performed including R leg. Relevant physical exam findings are noted in the Assessment and Plan.  Right Lower Leg - Anterior abraisions     trunk, extremities Scaly patches arms, legs   Assessment & Plan  Traumatic injury Laceration -mild Right Lower Leg - Anterior From Unna boot removal at wound care facility per patient Wound cleansed with Puracyn, followed by Mupirocin oint, nonstick telfa and coban  Atopic neurodermatitis trunk, extremities And allergic contact dermatitis   Biopsy proven - "spongiotic dermatitis" And Contact dermatitis from Hibiclens -patch test proven    Very complicated patient with multiple medical and skin issues.   Chronic and persistent condition with duration or expected duration over one year. Condition is bothersome/symptomatic for patient. Currently flared, due to missing her Dupixent injections, but still overall improved.  Atopic dermatitis - Severe, on Dupixent (biologic medication).  Atopic dermatitis (eczema) is a chronic, relapsing, pruritic condition that can significantly affect quality of life. It is often associated with allergic rhinitis and/or asthma and  can require treatment with topical medications, phototherapy, or in severe cases a biologic medication called Dupixent, which requires long term medication management.     Continue Dupixent '300mg'$ /56m, injected today to left upper arm Lot # [id number] Exp: 08/2023 Cont Tacrolimus 0.1% oint qd/bid to aa eczema prn flares Cont TMC 0.1% oint qd up to 5d/wk aa eczema prn flares, avoid f/g/a  Discussed with caregiver once pt gets approved for Dupixent, pt can have medication shipped to facility and they have a nurse that can do the Dupixent injections.  Dupilumab (Dupixent) is a treatment given by injection for adults and children with moderate-to-severe atopic dermatitis. Goal is control of skin condition, not cure. It is given as 2 injections at the first dose followed by 1 injection ever 2 weeks thereafter.  Young children are dosed monthly.  Potential side effects include allergic reaction, herpes infections, injection site reactions and conjunctivitis (inflammation of the eyes).  The use of Dupixent requires long term medication management, including periodic office visits.   Topical steroids (such as triamcinolone, fluocinolone, fluocinonide, mometasone, clobetasol, halobetasol, betamethasone, hydrocortisone) can cause thinning and lightening of the skin if they are used for too long in the same area. Your physician has selected the right strength medicine for your problem and area affected on the body. Please use your medication only as directed by your physician to prevent side effects.    dupilumab (DUPIXENT) prefilled syringe 300 mg - trunk, extremities  Related Medications tacrolimus (PROTOPIC) 0.1 % ointment Apply topically 2 (two) times daily.  Return in about 2 weeks (around 08/31/2021) for Atopic Derm, 6wks with Dr. KNehemiah Massed  for AD.  I, Othelia Pulling, RMA, am acting as scribe for Sarina Ser, MD .  Documentation: I have reviewed the above documentation for accuracy and completeness,  and I agree with the above.  Sarina Ser, MD

## 2021-08-19 ENCOUNTER — Encounter (INDEPENDENT_AMBULATORY_CARE_PROVIDER_SITE_OTHER): Payer: Self-pay

## 2021-08-19 ENCOUNTER — Ambulatory Visit (INDEPENDENT_AMBULATORY_CARE_PROVIDER_SITE_OTHER): Payer: Medicare Other | Admitting: Nurse Practitioner

## 2021-08-19 VITALS — BP 172/81 | HR 68 | Resp 17

## 2021-08-19 DIAGNOSIS — I890 Lymphedema, not elsewhere classified: Secondary | ICD-10-CM | POA: Diagnosis not present

## 2021-08-19 NOTE — Progress Notes (Signed)
History of Present Illness  There is no documented history at this time  Assessments & Plan   There are no diagnoses linked to this encounter.    Additional instructions  Subjective:  Patient presents with venous ulcer of the Bilateral lower extremity.    Procedure:  3 layer unna wrap was placed Bilateral lower extremity.   Plan:   Follow up in one week.  

## 2021-08-23 ENCOUNTER — Encounter: Payer: Self-pay | Admitting: Dermatology

## 2021-08-26 ENCOUNTER — Ambulatory Visit (INDEPENDENT_AMBULATORY_CARE_PROVIDER_SITE_OTHER): Payer: Medicare Other | Admitting: Nurse Practitioner

## 2021-08-26 ENCOUNTER — Encounter (INDEPENDENT_AMBULATORY_CARE_PROVIDER_SITE_OTHER): Payer: Self-pay

## 2021-08-26 VITALS — BP 117/70 | HR 54 | Resp 16 | Wt 181.8 lb

## 2021-08-26 DIAGNOSIS — I890 Lymphedema, not elsewhere classified: Secondary | ICD-10-CM | POA: Diagnosis not present

## 2021-08-26 NOTE — Progress Notes (Signed)
History of Present Illness  There is no documented history at this time  Assessments & Plan   There are no diagnoses linked to this encounter.    Additional instructions  Subjective:  Patient presents with venous ulcer of the Bilateral lower extremity.    Procedure:  3 layer unna wrap was placed Bilateral lower extremity.   Plan:   Follow up in one week.  

## 2021-08-29 ENCOUNTER — Encounter (INDEPENDENT_AMBULATORY_CARE_PROVIDER_SITE_OTHER): Payer: Self-pay | Admitting: Nurse Practitioner

## 2021-08-29 ENCOUNTER — Ambulatory Visit
Admission: RE | Admit: 2021-08-29 | Discharge: 2021-08-29 | Disposition: A | Discharge status: Home / Self Care | Payer: Medicare Other | Source: Ambulatory Visit | Attending: Family Medicine | Admitting: Family Medicine

## 2021-08-29 DIAGNOSIS — Z1231 Encounter for screening mammogram for malignant neoplasm of breast: Secondary | ICD-10-CM | POA: Insufficient documentation

## 2021-08-31 ENCOUNTER — Ambulatory Visit (INDEPENDENT_AMBULATORY_CARE_PROVIDER_SITE_OTHER): Payer: Medicare Other | Admitting: Dermatology

## 2021-08-31 ENCOUNTER — Encounter: Payer: Self-pay | Admitting: Dermatology

## 2021-08-31 DIAGNOSIS — L209 Atopic dermatitis, unspecified: Secondary | ICD-10-CM

## 2021-08-31 MED ORDER — DUPILUMAB 300 MG/2ML ~~LOC~~ SOSY
300.0000 mg | PREFILLED_SYRINGE | Freq: Once | SUBCUTANEOUS | Status: AC
  Administered 2021-08-31: 300 mg via SUBCUTANEOUS

## 2021-08-31 NOTE — Progress Notes (Signed)
Patient here today for 2 week injection for severe Atopic Dermatitis.   Dupixent '300mg'$ /32m injected into right upper arm today. Patient tolerated procedure well.   LPSU:[id number]:09/2023 NUWT:[id number] ADicie BeamRMA Documentation: I have reviewed the above documentation for accuracy and completeness, and I agree with the above.  [name] MD

## 2021-09-02 ENCOUNTER — Encounter (INDEPENDENT_AMBULATORY_CARE_PROVIDER_SITE_OTHER): Payer: Self-pay | Admitting: Nurse Practitioner

## 2021-09-02 ENCOUNTER — Ambulatory Visit (INDEPENDENT_AMBULATORY_CARE_PROVIDER_SITE_OTHER): Payer: Medicare Other | Admitting: Nurse Practitioner

## 2021-09-02 VITALS — BP 117/70 | HR 63 | Resp 18

## 2021-09-02 DIAGNOSIS — I890 Lymphedema, not elsewhere classified: Secondary | ICD-10-CM

## 2021-09-02 NOTE — Progress Notes (Signed)
History of Present Illness  There is no documented history at this time  Assessments & Plan   There are no diagnoses linked to this encounter.    Additional instructions  Subjective:  Patient presents with venous ulcer of the Bilateral lower extremity.    Procedure:  3 layer unna wrap was placed Bilateral lower extremity.   Plan:   Follow up in one week.  

## 2021-09-06 ENCOUNTER — Other Ambulatory Visit: Payer: Self-pay | Admitting: Family Medicine

## 2021-09-06 DIAGNOSIS — R928 Other abnormal and inconclusive findings on diagnostic imaging of breast: Secondary | ICD-10-CM

## 2021-09-06 DIAGNOSIS — R921 Mammographic calcification found on diagnostic imaging of breast: Secondary | ICD-10-CM

## 2021-09-09 ENCOUNTER — Ambulatory Visit (INDEPENDENT_AMBULATORY_CARE_PROVIDER_SITE_OTHER): Payer: Medicare Other | Admitting: Nurse Practitioner

## 2021-09-09 ENCOUNTER — Encounter (INDEPENDENT_AMBULATORY_CARE_PROVIDER_SITE_OTHER): Payer: Self-pay | Admitting: Nurse Practitioner

## 2021-09-09 VITALS — BP 120/76 | HR 58 | Resp 18 | Ht 62.0 in | Wt 178.2 lb

## 2021-09-09 DIAGNOSIS — I10 Essential (primary) hypertension: Secondary | ICD-10-CM | POA: Diagnosis not present

## 2021-09-09 DIAGNOSIS — I890 Lymphedema, not elsewhere classified: Secondary | ICD-10-CM

## 2021-09-26 ENCOUNTER — Inpatient Hospital Stay: Admission: RE | Admit: 2021-09-26 | Payer: Medicare Other | Source: Ambulatory Visit

## 2021-09-28 ENCOUNTER — Ambulatory Visit (INDEPENDENT_AMBULATORY_CARE_PROVIDER_SITE_OTHER): Payer: Medicare Other | Admitting: Dermatology

## 2021-09-28 ENCOUNTER — Encounter: Payer: Self-pay | Admitting: Dermatology

## 2021-09-28 DIAGNOSIS — Z79899 Other long term (current) drug therapy: Secondary | ICD-10-CM | POA: Diagnosis not present

## 2021-09-28 DIAGNOSIS — L209 Atopic dermatitis, unspecified: Secondary | ICD-10-CM | POA: Diagnosis not present

## 2021-09-28 DIAGNOSIS — I890 Lymphedema, not elsewhere classified: Secondary | ICD-10-CM

## 2021-09-28 NOTE — Progress Notes (Unsigned)
   Follow-Up Visit   Subjective  Laurie Rice is a 61 y.o. female who presents for the following: Atopic dermatitis (Patient currently using Dupixent '300mg'$ /15m SQ QOW and tolerating well. She also has TMC and Mupirocin ointment as needed).   The following portions of the chart were reviewed this encounter and updated as appropriate:       Review of Systems:  No other skin or systemic complaints except as noted in HPI or Assessment and Plan.  Objective  Well appearing patient in no apparent distress; mood and affect are within normal limits.  A focused examination was performed including the face and extremities. Relevant physical exam findings are noted in the Assessment and Plan.    Assessment & Plan  Atopic dermatitis, unspecified type Face, trunk, extremities  Atopic dermatitis (eczema) is a chronic, relapsing, pruritic condition that can significantly affect quality of life. It is often associated with allergic rhinitis and/or asthma and can require treatment with topical medications, phototherapy, or in severe cases biologic injectable medication (Dupixent; Adbry) or Oral JAK inhibitors.   No follow-ups on file.  Laurie Rice CMA, am acting as scribe for [name] MD .

## 2021-09-28 NOTE — Patient Instructions (Signed)
Due to recent changes in healthcare laws, you may see results of your pathology and/or laboratory studies on MyChart before the doctors have had a chance to review them. We understand that in some cases there may be results that are confusing or concerning to you. Please understand that not all results are received at the same time and often the doctors may need to interpret multiple results in order to provide you with the best plan of care or course of treatment. Therefore, we ask that you please give us 2 business days to thoroughly review all your results before contacting the office for clarification. Should we see a critical lab result, you will be contacted sooner.   If You Need Anything After Your Visit  If you have any questions or concerns for your doctor, please call our main line at [phone] and press option 4 to reach your doctor's medical assistant. If no one answers, please leave a voicemail as directed and we will return your call as soon as possible. Messages left after 4 pm will be answered the following business day.   You may also send us a message via MyChart. We typically respond to MyChart messages within 1-2 business days.  For prescription refills, please ask your pharmacy to contact our office. Our fax number is [phone].  If you have an urgent issue when the clinic is closed that cannot wait until the next business day, you can page your doctor at the number below.    Please note that while we do our best to be available for urgent issues outside of office hours, we are not available 24/7.   If you have an urgent issue and are unable to reach us, you may choose to seek medical care at your doctor's office, retail clinic, urgent care center, or emergency room.  If you have a medical emergency, please immediately call 911 or go to the emergency department.  Pager Numbers  - Dr. Kowalski: [phone]  - Dr. Moye: [phone]  - Dr. Stewart:  [phone]  In the event of inclement weather, please call our main line at [phone] for an update on the status of any delays or closures.  Dermatology Medication Tips: Please keep the boxes that topical medications come in in order to help keep track of the instructions about where and how to use these. Pharmacies typically print the medication instructions only on the boxes and not directly on the medication tubes.   If your medication is too expensive, please contact our office at [phone] option 4 or send us a message through MyChart.   We are unable to tell what your co-pay for medications will be in advance as this is different depending on your insurance coverage. However, we may be able to find a substitute medication at lower cost or fill out paperwork to get insurance to cover a needed medication.   If a prior authorization is required to get your medication covered by your insurance company, please allow us 1-2 business days to complete this process.  Drug prices often vary depending on where the prescription is filled and some pharmacies may offer cheaper prices.  The website www.goodrx.com contains coupons for medications through different pharmacies. The prices here do not account for what the cost may be with help from insurance (it may be cheaper with your insurance), but the website can give you the price if you did not use any insurance.  - You can print the associated coupon and take it with   your prescription to the pharmacy.  - You may also stop by our office during regular business hours and pick up a GoodRx coupon card.  - If you need your prescription sent electronically to a different pharmacy, notify our office through [place] MyChart or by phone at [phone] option 4.     Si Usted Necesita Algo Despus de Su Visita  Tambin puede enviarnos un mensaje a travs de MyChart. Por lo general respondemos a los mensajes de MyChart en el transcurso de 1 a 2  das hbiles.  Para renovar recetas, por favor pida a su farmacia que se ponga en contacto con nuestra oficina. Nuestro nmero de fax es el [phone].  Si tiene un asunto urgente cuando la clnica est cerrada y que no puede esperar hasta el siguiente da hbil, puede llamar/localizar a su doctor(a) al nmero que aparece a continuacin.   Por favor, tenga en cuenta que aunque hacemos todo lo posible para estar disponibles para asuntos urgentes fuera del horario de oficina, no estamos disponibles las 24 horas del da, los 7 das de la semana.   Si tiene un problema urgente y no puede comunicarse con nosotros, puede optar por buscar atencin mdica  en el consultorio de su doctor(a), en una clnica privada, en un centro de atencin urgente o en una sala de emergencias.  Si tiene una emergencia mdica, por favor llame inmediatamente al 911 o vaya a la sala de emergencias.  Nmeros de bper  - Dr. Kowalski: [phone]  - Dra. Moye: [phone]  - Dra. Stewart: [phone]  En caso de inclemencias del tiempo, por favor llame a nuestra lnea principal al [phone] para una actualizacin sobre el estado de cualquier retraso o cierre.  Consejos para la medicacin en dermatologa: Por favor, guarde las cajas en las que vienen los medicamentos de uso tpico para ayudarle a seguir las instrucciones sobre dnde y cmo usarlos. Las farmacias generalmente imprimen las instrucciones del medicamento slo en las cajas y no directamente en los tubos del medicamento.   Si su medicamento es muy caro, por favor, pngase en contacto con nuestra oficina llamando al [phone] y presione la opcin 4 o envenos un mensaje a travs de MyChart.   No podemos decirle cul ser su copago por los medicamentos por adelantado ya que esto es diferente dependiendo de la cobertura de su seguro. Sin embargo, es posible que podamos encontrar un medicamento sustituto a menor costo o llenar un formulario para que el  seguro cubra el medicamento que se considera necesario.   Si se requiere una autorizacin previa para que su compaa de seguros cubra su medicamento, por favor permtanos de 1 a 2 das hbiles para completar este proceso.  Los precios de los medicamentos varan con frecuencia dependiendo del lugar de dnde se surte la receta y alguna farmacias pueden ofrecer precios ms baratos.  El sitio web www.goodrx.com tiene cupones para medicamentos de diferentes farmacias. Los precios aqu no tienen en cuenta lo que podra costar con la ayuda del seguro (puede ser ms barato con su seguro), pero el sitio web puede darle el precio si no utiliz ningn seguro.  - Puede imprimir el cupn correspondiente y llevarlo con su receta a la farmacia.  - Tambin puede pasar por nuestra oficina durante el horario de atencin regular y recoger una tarjeta de cupones de GoodRx.  - Si necesita que su receta se enve electrnicamente a una farmacia diferente, informe a nuestra oficina a travs de MyChart de [place]   o por telfono llamando al [phone] y presione la opcin 4.  

## 2021-09-28 NOTE — Progress Notes (Unsigned)
   Follow-Up Visit   Subjective  Laurie Rice is a 61 y.o. female who presents for the following: Atopic dermatitis (Patient currently using Dupixent '300mg'$ /80m SQ QOW and tolerating well. She also has TMC and Mupirocin ointment as needed) and Lymphedema (Of the lower legs - currently being seen by vein and vascular. Caregiver states that improvement of swelling comes and goes but she was approved for lymphedema pump and is waiting on it to arrive).  The following portions of the chart were reviewed this encounter and updated as appropriate:   Tobacco  Allergies  Meds  Problems  Med Hx  Surg Hx  Fam Hx     Review of Systems:  No other skin or systemic complaints except as noted in HPI or Assessment and Plan.  Objective  Well appearing patient in no apparent distress; mood and affect are within normal limits.  A focused examination was performed including the face and extremities. Relevant physical exam findings are noted in the Assessment and Plan.  Face, trunk, extremities Stasis changes with hyperpigmentation of the lower legs.  B/L leg Stasis changes with hyperpigmentation of the lower legs.   Assessment & Plan  Atopic dermatitis,  Face, trunk, extremities Chronic and persistent condition with duration or expected duration over one year. Condition is symptomatic / bothersome to patient. Not to goal, but continues to improve  Atopic dermatitis (eczema) is a chronic, relapsing, pruritic condition that can significantly affect quality of life. It is often associated with allergic rhinitis and/or asthma and can require treatment with topical medications, phototherapy, or in severe cases biologic injectable medication (Dupixent; Adbry) or Oral JAK inhibitors.  Continue Dupixent '300mg'$ /254mSQ QOW and TMC 0.1% to aa's QD-BID PRN. Dupilumab (Dupixent) is a treatment given by injection for adults and children with moderate-to-severe atopic dermatitis. Goal is control of skin  condition, not cure. It is given as 2 injections at the first dose followed by 1 injection ever 2 weeks thereafter.  Young children are dosed monthly.  Potential side effects include allergic reaction, herpes infections, injection site reactions and conjunctivitis (inflammation of the eyes).  The use of Dupixent requires long term medication management, including periodic office visits.  Consider trying to go off of Dupixent once patient is using her lymphedema pump.   Continue Tacrolimus to aa's face/groin/axilla QD PRN flares.  Lymphedema B/L leg Improved -  Continue care with vein and vascular physicians. Patient approved for lymphedema pump and is awaiting for pump to arrive.   Return in about 2 months (around 11/28/2021) for AD and lymphedema follow up .  I,Luther RedoCMA, am acting as scribe for DaSarina SerMD . Documentation: I have reviewed the above documentation for accuracy and completeness, and I agree with the above.  DaSarina SerMD

## 2021-09-29 ENCOUNTER — Encounter (INDEPENDENT_AMBULATORY_CARE_PROVIDER_SITE_OTHER): Payer: Self-pay | Admitting: Nurse Practitioner

## 2021-09-29 NOTE — Progress Notes (Signed)
Subjective:    Patient ID: Laurie Rice, female    DOB: 02-12-1960, 61 y.o.   MRN: [id number] No chief complaint on file.   The patient returns today for follow-up evaluation of her lower extremity lymphedema and in [place] wraps.  The patient has been in [place] wraps while awaiting a lymphedema pump.  The swelling is much improved.  However recently the patient had a flareup of her atopic dermatitis.  Her caretaker notes that she missed a dose and following this she has been having a very severe flare throughout her body.  She was given a cream to place on it but due to the [employer] she is not able to use this cream.  However, the swelling itself is much improved.    Review of Systems  Cardiovascular:  Positive for leg swelling.  Musculoskeletal:  Positive for gait problem.  All other systems reviewed and are negative.      Objective:   Physical Exam Vitals reviewed.  HENT:     Head: Normocephalic.  Cardiovascular:     Rate and Rhythm: Normal rate.     Pulses: Normal pulses.  Pulmonary:     Effort: Pulmonary effort is normal.  Skin:    General: Skin is warm and dry.  Neurological:     Mental Status: She is alert and oriented to person, place, and time.  Psychiatric:        Mood and Affect: Mood normal.        Behavior: Behavior normal.        Thought Content: Thought content normal.        Judgment: Judgment normal.     BP 120/76 (BP Location: Right Arm)   Pulse (!) 58   Resp 18   Ht '5\' 2"'$  (1.575 m)   Wt 178 lb 3.2 oz (80.8 kg)   BMI 32.59 kg/m   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   History of lymphocytosis    Impetigo    History of   Liver masses    History of, Benign   Meningioma (HCC)    Mild mental retardation    Monoclonal gammopathy    low level   Personal history of pulmonary embolism    Seizures (HCC)     Thyroid mass    history of    Social History   Socioeconomic History   Marital status: Single    Spouse name: Not on file   Number of children: Not on file   Years of education: Not on file   Highest education level: Not on file  Occupational History   Not on file  Tobacco Use   Smoking status: Never   Smokeless tobacco: Never  Substance and Sexual Activity   Alcohol use: No   Drug use: No   Sexual activity: Not on file  Other Topics Concern   Not on file  Social History Narrative   Not on file   Social Determinants of Health   Financial Resource Strain: Not on file  Food Insecurity: Not on file  Transportation Needs: Not on file  Physical Activity: Not on file  Stress: Not on file  Social Connections: Not on file  Intimate Partner Violence: Not on file    Past Surgical History:  Procedure Laterality Date   NO  PAST SURGERIES      Family History  Problem Relation Age of Onset   Hypertension Other    Arthritis Other    Breast cancer Neg Hx     Allergies  Allergen Reactions   Dalbavancin Rash    Widespread rash with no systemic symptoms       Latest Ref Rng & Units 07/12/2021   10:48 AM 02/28/2021    6:36 AM 02/26/2021    6:10 PM  CBC  WBC 4.0 - 10.5 K/uL 9.7  9.0  11.0   Hemoglobin 12.0 - 15.0 g/dL 11.8  9.9  11.0   Hematocrit 36.0 - 46.0 % 39.7  32.7  37.3   Platelets 150 - 400 K/uL 243  211  283       CMP     Component Value Date/Time   NA 139 07/12/2021 1048   NA 139 11/23/2011 1712   K 3.7 07/12/2021 1048   K 4.3 11/23/2011 1712   CL 104 07/12/2021 1048   CL 107 11/23/2011 1712   CO2 29 07/12/2021 1048   CO2 29 11/23/2011 1712   GLUCOSE 71 07/12/2021 1048   GLUCOSE 83 11/23/2011 1712   BUN 16 07/12/2021 1048   BUN 12 11/23/2011 1712   CREATININE 0.84 07/12/2021 1048   CREATININE 0.80 07/17/2012 1123   CALCIUM 8.8 (L) 07/12/2021 1048   CALCIUM 8.9 11/23/2011 1712   PROT 7.8 07/12/2021 1048   PROT 8.8 (H) 11/23/2011 1712   ALBUMIN  3.6 07/12/2021 1048   ALBUMIN 3.5 11/23/2011 1712   AST 18 07/12/2021 1048   AST 25 11/23/2011 1712   ALT 11 07/12/2021 1048   ALT 20 11/23/2011 1712   ALKPHOS 109 07/12/2021 1048   ALKPHOS 147 (H) 11/23/2011 1712   BILITOT 0.4 07/12/2021 1048   BILITOT 0.1 (L) 11/23/2011 1712   GFRNONAA >60 07/12/2021 1048   GFRNONAA >60 07/17/2012 1123   GFRAA >60 07/02/2019 1145   GFRAA >60 07/17/2012 1123     No results found.     Assessment & Plan:   1. Lymphedema Today the patient's swelling is improved however she has significant areas of rash and superficial wounds.  This is likely driven by her atopic dermatitis.  However the patient was given a cream from her dermatologist but she cannot place it because of the [place] wraps.  Based on this the patient will come out of the [place] wraps.  Her attendant is advised to utilize Ace bandages to help with compression.  Patient will continue to follow with dermatology for treatment of her atopic dermatitis.  We will see her back in 4 weeks for reevaluation of her lower extremity edema.  2. Essential hypertension Continue antihypertensive medications as already ordered, these medications have been reviewed and there are no changes at this time.    Current Outpatient Medications on File Prior to Visit  Medication Sig Dispense Refill   bisoprolol-hydrochlorothiazide (ZIAC) 2.5-6.25 MG tablet Take 2 tablets by mouth daily.     dupilumab (DUPIXENT) 300 MG/2ML prefilled syringe Inject 300 mg into the skin every 14 (fourteen) days. Starting at day 15 for maintenance. 4 mL 3   Emollient (CETAPHIL) cream      ferrous sulfate 325 (65 FE) MG EC tablet Take 1 tablet (325 mg total) by mouth daily with breakfast. With orange juice 30 tablet 3   folic acid (FOLVITE) 1 MG tablet TAKE ONE TABLET BY MOUTH EACH DAY.     hydrocortisone 2.5 % cream Apply  twice daily to abdominal area for two weeks. Apply 2nd 60 g 3   mupirocin ointment (BACTROBAN) 2 % Discontinue all  previous orders for Mupirocin and apply to aa's as directed. 22 g 3   PHENObarbital (LUMINAL) 60 MG tablet Take 60 mg by mouth at bedtime.     phenytoin (DILANTIN) 100 MG ER capsule TAKE 2 CAPSULES BY MOUTH AT BEDTIME (SEIZURE DISORDER)     phenytoin (DILANTIN) 50 MG tablet Chew 50 mg by mouth at bedtime.     warfarin (COUMADIN) 1 MG tablet Take 1 tablet (1 mg total) by mouth every Monday, Wednesday, and Friday. Take with (2) 4 mg tablets every Monday, Wednesday, and Friday for a total of 9 mg on those days. 15 tablet 0   warfarin (COUMADIN) 4 MG tablet Take 2 tablets (8 mg total) by mouth daily at 4 PM. Take 2 tablets daily for 8 mg daily. Add (1) 1 mg tablet on MWF for a total of 9 mg on those days. 60 tablet 0   Bismuth Tribromoph-Petrolatum (XEROFORM OCCLUSIVE GAUZE PATCH) PADS Apply 1 application topically daily. (Patient not taking: Reported on 09/09/2021) 50 each 0   Gauze Pads & Dressings (KERLIX BANDAGE ROLL 4.5"X9.3') MISC 1 application by Does not apply route daily. (Patient not taking: Reported on 09/09/2021) 100 each 0   ketoconazole (NIZORAL) 2 % cream Apply twice daily to abdominal area for 2 weeks. Apply 1st (Patient not taking: Reported on 09/09/2021) 60 g 2   mupirocin ointment (BACTROBAN) 2 % Apply 1 application. topically daily. Daily with dressing change (Patient not taking: Reported on 09/09/2021) 22 g 2   tacrolimus (PROTOPIC) 0.1 % ointment Apply topically 2 (two) times daily. (Patient not taking: Reported on 09/09/2021) 100 g 0   triamcinolone ointment (KENALOG) 0.1 % Apply 1 Application topically 2 (two) times daily. (Patient not taking: Reported on 09/09/2021)     warfarin (COUMADIN) 1 MG tablet Take 1 tablet (1 mg total) by mouth daily. (Patient not taking: Reported on 07/26/2021) 30 tablet 11   No current facility-administered medications on file prior to visit.    There are no Patient Instructions on file for this visit. No follow-ups on file.   Kris Hartmann, NP

## 2021-10-07 ENCOUNTER — Ambulatory Visit (INDEPENDENT_AMBULATORY_CARE_PROVIDER_SITE_OTHER): Payer: Medicare Other | Admitting: Nurse Practitioner

## 2021-10-14 ENCOUNTER — Ambulatory Visit (INDEPENDENT_AMBULATORY_CARE_PROVIDER_SITE_OTHER): Payer: Medicare Other | Admitting: Nurse Practitioner

## 2021-10-14 ENCOUNTER — Encounter (INDEPENDENT_AMBULATORY_CARE_PROVIDER_SITE_OTHER): Payer: Self-pay | Admitting: Nurse Practitioner

## 2021-10-14 VITALS — BP 126/75 | HR 60 | Resp 16 | Wt 181.0 lb

## 2021-10-14 DIAGNOSIS — I890 Lymphedema, not elsewhere classified: Secondary | ICD-10-CM

## 2021-10-14 DIAGNOSIS — I10 Essential (primary) hypertension: Secondary | ICD-10-CM

## 2021-10-15 ENCOUNTER — Encounter (INDEPENDENT_AMBULATORY_CARE_PROVIDER_SITE_OTHER): Payer: Self-pay | Admitting: Nurse Practitioner

## 2021-10-15 NOTE — Progress Notes (Signed)
Subjective:    Patient ID: Laurie Rice, female    DOB: Feb 18, 1960, 61 y.o.   MRN: [id number] Chief Complaint  Patient presents with   Follow-up    4 week follow up    The patient returns today for follow-up evaluation of her lymphedema.  The patient's legs are much improved following treatment by dermatology for her psoriasis.  His swelling is also doing well.  They have been utilizing Ace bandages.  There are currently no open wounds or ulcerations.  The patient currently has no fevers or chills.  Overall her swelling is doing well.    Review of Systems  Cardiovascular:  Positive for leg swelling.  Neurological:  Positive for weakness.  All other systems reviewed and are negative.      Objective:   Physical Exam Vitals reviewed.  HENT:     Head: Normocephalic.  Cardiovascular:     Rate and Rhythm: Normal rate.  Pulmonary:     Effort: Pulmonary effort is normal.  Skin:    General: Skin is warm and dry.  Neurological:     Mental Status: She is alert and oriented to person, place, and time.  Psychiatric:        Mood and Affect: Mood normal.        Behavior: Behavior normal.        Thought Content: Thought content normal.        Judgment: Judgment normal.     BP 126/75 (BP Location: Left Arm)   Pulse 60   Resp 16   Wt 181 lb (82.1 kg)   BMI 33.11 kg/m   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   History of lymphocytosis    Impetigo    History of   Liver masses    History of, Benign   Meningioma (HCC)    Mild mental retardation    Monoclonal gammopathy    low level   Personal history of pulmonary embolism    Seizures (HCC)    Thyroid mass    history of    Social History   Socioeconomic History   Marital status: Single    Spouse name: Not on file   Number of children: Not on file   Years of education: Not on  file   Highest education level: Not on file  Occupational History   Not on file  Tobacco Use   Smoking status: Never   Smokeless tobacco: Never  Substance and Sexual Activity   Alcohol use: No   Drug use: No   Sexual activity: Not on file  Other Topics Concern   Not on file  Social History Narrative   Not on file   Social Determinants of Health   Financial Resource Strain: Not on file  Food Insecurity: Not on file  Transportation Needs: Not on file  Physical Activity: Not on file  Stress: Not on file  Social Connections: Not on file  Intimate Partner Violence: Not on file    Past Surgical History:  Procedure Laterality Date   NO PAST SURGERIES      Family History  Problem Relation Age of Onset   Hypertension Other    Arthritis Other    Breast cancer Neg Hx     Allergies  Allergen Reactions   Dalbavancin Rash  Widespread rash with no systemic symptoms       Latest Ref Rng & Units 07/12/2021   10:48 AM 02/28/2021    6:36 AM 02/26/2021    6:10 PM  CBC  WBC 4.0 - 10.5 K/uL 9.7  9.0  11.0   Hemoglobin 12.0 - 15.0 g/dL 11.8  9.9  11.0   Hematocrit 36.0 - 46.0 % 39.7  32.7  37.3   Platelets 150 - 400 K/uL 243  211  283       CMP     Component Value Date/Time   NA 139 07/12/2021 1048   NA 139 11/23/2011 1712   K 3.7 07/12/2021 1048   K 4.3 11/23/2011 1712   CL 104 07/12/2021 1048   CL 107 11/23/2011 1712   CO2 29 07/12/2021 1048   CO2 29 11/23/2011 1712   GLUCOSE 71 07/12/2021 1048   GLUCOSE 83 11/23/2011 1712   BUN 16 07/12/2021 1048   BUN 12 11/23/2011 1712   CREATININE 0.84 07/12/2021 1048   CREATININE 0.80 07/17/2012 1123   CALCIUM 8.8 (L) 07/12/2021 1048   CALCIUM 8.9 11/23/2011 1712   PROT 7.8 07/12/2021 1048   PROT 8.8 (H) 11/23/2011 1712   ALBUMIN 3.6 07/12/2021 1048   ALBUMIN 3.5 11/23/2011 1712   AST 18 07/12/2021 1048   AST 25 11/23/2011 1712   ALT 11 07/12/2021 1048   ALT 20 11/23/2011 1712   ALKPHOS 109 07/12/2021 1048    ALKPHOS 147 (H) 11/23/2011 1712   BILITOT 0.4 07/12/2021 1048   BILITOT 0.1 (L) 11/23/2011 1712   GFRNONAA >60 07/12/2021 1048   GFRNONAA >60 07/17/2012 1123   GFRAA >60 07/02/2019 1145   GFRAA >60 07/17/2012 1123     No results found.     Assessment & Plan:   1. Lymphedema The patient has good control of her swelling currently.  The skin appears much improved following treatment as well as dermatology.  We will continue to have her utilize Ace bandages and have instructed her caretakers to place her into medical grade compression stockings.  They are also instructed that you with the lymphedema pump compression stockings help with continued control of her lymphedema.  The patient return in 3 months   2. Essential hypertension Continue antihypertensive medications as already ordered, these medications have been reviewed and there are no changes at this time.    Current Outpatient Medications on File Prior to Visit  Medication Sig Dispense Refill   bisoprolol-hydrochlorothiazide (ZIAC) 2.5-6.25 MG tablet Take 2 tablets by mouth daily.     dupilumab (DUPIXENT) 300 MG/2ML prefilled syringe Inject 300 mg into the skin every 14 (fourteen) days. Starting at day 15 for maintenance. 4 mL 3   Emollient (CETAPHIL) cream      ferrous sulfate 325 (65 FE) MG EC tablet Take 1 tablet (325 mg total) by mouth daily with breakfast. With orange juice 30 tablet 3   folic acid (FOLVITE) 1 MG tablet TAKE ONE TABLET BY MOUTH EACH DAY.     hydrocortisone 2.5 % cream Apply twice daily to abdominal area for two weeks. Apply 2nd 60 g 3   mupirocin ointment (BACTROBAN) 2 % Discontinue all previous orders for Mupirocin and apply to aa's as directed. 22 g 3   PHENObarbital (LUMINAL) 60 MG tablet Take 60 mg by mouth at bedtime.     phenytoin (DILANTIN) 100 MG ER capsule TAKE 2 CAPSULES BY MOUTH AT BEDTIME (SEIZURE DISORDER)     phenytoin (DILANTIN) 50 MG  tablet Chew 50 mg by mouth at bedtime.     warfarin  (COUMADIN) 1 MG tablet Take 1 tablet (1 mg total) by mouth every Monday, Wednesday, and Friday. Take with (2) 4 mg tablets every Monday, Wednesday, and Friday for a total of 9 mg on those days. 15 tablet 0   warfarin (COUMADIN) 4 MG tablet Take 2 tablets (8 mg total) by mouth daily at 4 PM. Take 2 tablets daily for 8 mg daily. Add (1) 1 mg tablet on MWF for a total of 9 mg on those days. 60 tablet 0   Bismuth Tribromoph-Petrolatum (XEROFORM OCCLUSIVE GAUZE PATCH) PADS Apply 1 application topically daily. (Patient not taking: Reported on 09/09/2021) 50 each 0   Gauze Pads & Dressings (KERLIX BANDAGE ROLL 4.5"X9.3') MISC 1 application by Does not apply route daily. (Patient not taking: Reported on 09/09/2021) 100 each 0   ketoconazole (NIZORAL) 2 % cream Apply twice daily to abdominal area for 2 weeks. Apply 1st (Patient not taking: Reported on 09/09/2021) 60 g 2   mupirocin ointment (BACTROBAN) 2 % Apply 1 application. topically daily. Daily with dressing change (Patient not taking: Reported on 09/09/2021) 22 g 2   tacrolimus (PROTOPIC) 0.1 % ointment Apply topically 2 (two) times daily. (Patient not taking: Reported on 09/09/2021) 100 g 0   triamcinolone ointment (KENALOG) 0.1 % Apply 1 Application topically 2 (two) times daily. (Patient not taking: Reported on 09/09/2021)     warfarin (COUMADIN) 1 MG tablet Take 1 tablet (1 mg total) by mouth daily. (Patient not taking: Reported on 07/26/2021) 30 tablet 11   No current facility-administered medications on file prior to visit.    There are no Patient Instructions on file for this visit. No follow-ups on file.   Kris Hartmann, NP

## 2021-10-19 ENCOUNTER — Ambulatory Visit
Admission: RE | Admit: 2021-10-19 | Discharge: 2021-10-19 | Disposition: A | Discharge status: Home / Self Care | Payer: Medicare Other | Source: Ambulatory Visit | Attending: Family Medicine | Admitting: Family Medicine

## 2021-10-19 DIAGNOSIS — R921 Mammographic calcification found on diagnostic imaging of breast: Secondary | ICD-10-CM | POA: Diagnosis present

## 2021-10-19 DIAGNOSIS — R928 Other abnormal and inconclusive findings on diagnostic imaging of breast: Secondary | ICD-10-CM | POA: Diagnosis present

## 2021-11-30 ENCOUNTER — Ambulatory Visit: Payer: Medicare Other | Admitting: Dermatology

## 2021-12-09 ENCOUNTER — Telehealth (INDEPENDENT_AMBULATORY_CARE_PROVIDER_SITE_OTHER): Payer: Self-pay | Admitting: Nurse Practitioner

## 2021-12-09 NOTE — Telephone Encounter (Signed)
After 4 weeks of exercise, elevation, and compression patient lymphedema and hyperpigmentation has not improved. Recommendation of pump.   Patient recorded Measurements: Left ankle  28.8cm (10/21);  29.0cm(11/18) Right ankle  29.6cm(10/21)  30.3cm(11/18) Left calf      52.8cm(10/21);  53.5cm(11/18) Right calf         54.5cm(10/21);  54.8cm(11/18)

## 2021-12-15 ENCOUNTER — Other Ambulatory Visit: Payer: Self-pay

## 2021-12-15 ENCOUNTER — Ambulatory Visit (INDEPENDENT_AMBULATORY_CARE_PROVIDER_SITE_OTHER): Payer: Medicare Other | Admitting: Dermatology

## 2021-12-15 DIAGNOSIS — I890 Lymphedema, not elsewhere classified: Secondary | ICD-10-CM

## 2021-12-15 DIAGNOSIS — L2089 Other atopic dermatitis: Secondary | ICD-10-CM

## 2021-12-15 DIAGNOSIS — L2081 Atopic neurodermatitis: Secondary | ICD-10-CM

## 2021-12-15 DIAGNOSIS — Z79899 Other long term (current) drug therapy: Secondary | ICD-10-CM | POA: Diagnosis not present

## 2021-12-15 MED ORDER — DUPIXENT 300 MG/2ML ~~LOC~~ SOSY: 300.0000 mg | PREFILLED_SYRINGE | SUBCUTANEOUS | 5 refills | Status: DC

## 2021-12-15 NOTE — Progress Notes (Signed)
Refill request faxed from [facility]

## 2021-12-15 NOTE — Patient Instructions (Addendum)
For Atopic Dermatitis Cont Dupixent '300mg'$ /66m sq injections q 2 wks Cont TMC 0.1% cr qd up to 5d/wk prn flares, avoid face, groin, axilla     Due to recent changes in healthcare laws, you may see results of your pathology and/or laboratory studies on MyChart before the doctors have had a chance to review them. We understand that in some cases there may be results that are confusing or concerning to you. Please understand that not all results are received at the same time and often the doctors may need to interpret multiple results in order to provide you with the best plan of care or course of treatment. Therefore, we ask that you please give [place] business days to thoroughly review all your results before contacting the office for clarification. Should we see a critical lab result, you will be contacted sooner.   If You Need Anything After Your Visit  If you have any questions or concerns for your doctor, please call our main line at [phone] press option 4 to reach your doctor's medical assistant. If no one answers, please leave a voicemail as directed and we will return your call as soon as possible. Messages left after 4 pm will be answered the following business day.   You may also send [place] message via [facility] We typically respond to MyChart messages within 1-2 business days.  For prescription refills, please ask your pharmacy to contact our office. Our fax number is [phone]  If you have an urgent issue when the clinic is closed that cannot wait until the next business day, you can page your doctor at the number below.    Please note that while we do our best to be available for urgent issues outside of office hours, we are not available 24/7.   If you have an urgent issue and are unable to reach [place] you may choose to seek medical care at your doctor's office, retail clinic, urgent care center, or emergency room.  If you have a medical emergency, please immediately call  911 or go to the emergency department.  Pager Numbers  - Dr. KNehemiah Massed [phone] - Dr. MLaurence Ferrari [phone] - Dr. SNicole Kindred [phone] In the event of inclement weather, please call our main line at [phone] an update on the status of any delays or closures.  Dermatology Medication Tips: Please keep the boxes that topical medications come in in order to help keep track of the instructions about where and how to use these. Pharmacies typically print the medication instructions only on the boxes and not directly on the medication tubes.   If your medication is too expensive, please contact our office at [phone] 4 or send [place] message through [facility]   We are unable to tell what your co-pay for medications will be in advance as this is different depending on your insurance coverage. However, we may be able to find a substitute medication at lower cost or fill out paperwork to get insurance to cover a needed medication.   If a prior authorization is required to get your medication covered by your insurance company, please allow [place]1-2 business days to complete this process.  Drug prices often vary depending on where the prescription is filled and some pharmacies may offer cheaper prices.  The website www.goodrx.com contains coupons for medications through different pharmacies. The prices here do not account for what the cost may be with help from insurance (it may be cheaper with  it may be cheaper with your insurance), but the website can give you the price if you did not use any insurance.  - You can print the associated coupon and take it with your prescription to the pharmacy.  - You may also stop by our office during regular business hours and pick up a GoodRx coupon card.  - If you need your prescription sent electronically to a different pharmacy, notify our office through [facility] MyChart or by phone at [phone] option 4.     Si Usted Necesita Algo Despus  de Su Visita  Tambin puede enviarnos un mensaje a travs de MyChart. Por lo general respondemos a los mensajes de MyChart en el transcurso de 1 a 2 das hbiles.  Para renovar recetas, por favor pida a su farmacia que se ponga en contacto con nuestra oficina. Nuestro nmero de fax es el [phone].  Si tiene un asunto urgente cuando la clnica est cerrada y que no puede esperar hasta el siguiente da hbil, puede llamar/localizar a su doctor(a) al nmero que aparece a continuacin.   Por favor, tenga en cuenta que aunque hacemos todo lo posible para estar disponibles para asuntos urgentes fuera del horario de oficina, no estamos disponibles las 24 horas del da, los 7 das de la semana.   Si tiene un problema urgente y no puede comunicarse con nosotros, puede optar por buscar atencin mdica  en el consultorio de su doctor(a), en una clnica privada, en un centro de atencin urgente o en una sala de emergencias.  Si tiene una emergencia mdica, por favor llame inmediatamente al 911 o vaya a la sala de emergencias.  Nmeros de bper  - Dr. Kowalski: [phone]  - Dra. Moye: [phone]  - Dra. Stewart: [phone]  En caso de inclemencias del tiempo, por favor llame a nuestra lnea principal al [phone] para una actualizacin sobre el estado de cualquier retraso o cierre.  Consejos para la medicacin en dermatologa: Por favor, guarde las cajas en las que vienen los medicamentos de uso tpico para ayudarle a seguir las instrucciones sobre dnde y cmo usarlos. Las farmacias generalmente imprimen las instrucciones del medicamento slo en las cajas y no directamente en los tubos del medicamento.   Si su medicamento es muy caro, por favor, pngase en contacto con nuestra oficina llamando al [phone] y presione la opcin 4 o envenos un mensaje a travs de MyChart.   No podemos decirle cul ser su copago por los medicamentos por adelantado ya que esto es diferente dependiendo  de la cobertura de su seguro. Sin embargo, es posible que podamos encontrar un medicamento sustituto a menor costo o llenar un formulario para que el seguro cubra el medicamento que se considera necesario.   Si se requiere una autorizacin previa para que su compaa de seguros cubra su medicamento, por favor permtanos de 1 a 2 das hbiles para completar este proceso.  Los precios de los medicamentos varan con frecuencia dependiendo del lugar de dnde se surte la receta y alguna farmacias pueden ofrecer precios ms baratos.  El sitio web www.goodrx.com tiene cupones para medicamentos de diferentes farmacias. Los precios aqu no tienen en cuenta lo que podra costar con la ayuda del seguro (puede ser ms barato con su seguro), pero el sitio web puede darle el precio si no utiliz ningn seguro.  - Puede imprimir el cupn correspondiente y llevarlo con su receta a la farmacia.  - Tambin puede pasar por nuestra oficina durante el horario de atencin   regular y recoger una tarjeta de cupones de GoodRx.  - Si necesita que su receta se enve electrnicamente a una farmacia diferente, informe a nuestra oficina a travs de MyChart de [facility] o por telfono llamando al [phone] y presione la opcin 4.  

## 2021-12-15 NOTE — Progress Notes (Signed)
   Follow-Up Visit   Subjective  Laurie Rice is a 61 y.o. female who presents for the following: Eczema (Face, trunk, extremities, Dupixent '300mg'$ /80m sq injections q 2wks, TMC 0.1% cr prn) and Lymphedema (Bil legs, pt has seen vein and vascular and are awaiting approval of lymphedema pumps). Patient accompanied by care giver  The following portions of the chart were reviewed this encounter and updated as appropriate:   Tobacco  Allergies  Meds  Problems  Med Hx  Surg Hx  Fam Hx     Review of Systems:  No other skin or systemic complaints except as noted in HPI or Assessment and Plan.  Objective  Well appearing patient in no apparent distress; mood and affect are within normal limits.  A focused examination was performed including legs, face. Relevant physical exam findings are noted in the Assessment and Plan.  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 This may be one of the best I have seen this patient's skin. face, trunk, extremities Chronic and persistent condition with duration or expected duration over one year. Condition is symptomatic / bothersome to patient. Not to goal.  Atopic dermatitis - Severe, on Dupixent (biologic medication).  Atopic dermatitis (eczema) is a chronic, relapsing, pruritic condition that can significantly affect quality of life. It is often associated with allergic rhinitis and/or asthma and can require treatment with topical medications, phototherapy, or in severe cases a biologic medication called Dupixent, which requires long term medication management.    Cont Dupixent '300mg'$ /230msq injections q 2 wks Cont TMC 0.1% cr qd up to 5d/wk prn flares, avoid face, groin, axilla  Related Medications tacrolimus (PROTOPIC) 0.1 % ointment Apply topically 2 (two) times daily.  Lymphedema bil lower legs Pt currently awaiting approval for lymphedema  pumps Cont care with [place] [facility]  Recommend graduated compression stockings, information on Carolon given to caregiver  Return in about 6 months (around 06/16/2022) for Atopic Derm.  I, SoOthelia PullingRMA, am acting as scribe for DaSarina SerMD . Documentation: I have reviewed the above documentation for accuracy and completeness, and I agree with the above.  DaSarina SerMD

## 2021-12-30 ENCOUNTER — Encounter: Payer: Self-pay | Admitting: Dermatology

## 2022-01-12 ENCOUNTER — Inpatient Hospital Stay: Payer: Medicare Other | Attending: Oncology

## 2022-01-12 ENCOUNTER — Ambulatory Visit: Admission: RE | Admit: 2022-01-12 | Payer: Medicare Other | Source: Ambulatory Visit

## 2022-01-12 DIAGNOSIS — Z86718 Personal history of other venous thrombosis and embolism: Secondary | ICD-10-CM | POA: Insufficient documentation

## 2022-01-12 DIAGNOSIS — Z7901 Long term (current) use of anticoagulants: Secondary | ICD-10-CM | POA: Insufficient documentation

## 2022-01-12 DIAGNOSIS — D509 Iron deficiency anemia, unspecified: Secondary | ICD-10-CM | POA: Insufficient documentation

## 2022-01-12 DIAGNOSIS — C8510 Unspecified B-cell lymphoma, unspecified site: Secondary | ICD-10-CM | POA: Insufficient documentation

## 2022-01-12 DIAGNOSIS — D563 Thalassemia minor: Secondary | ICD-10-CM | POA: Insufficient documentation

## 2022-01-13 ENCOUNTER — Encounter (INDEPENDENT_AMBULATORY_CARE_PROVIDER_SITE_OTHER): Payer: Self-pay | Admitting: Vascular Surgery

## 2022-01-13 ENCOUNTER — Ambulatory Visit (INDEPENDENT_AMBULATORY_CARE_PROVIDER_SITE_OTHER): Payer: Medicare Other | Admitting: Vascular Surgery

## 2022-01-13 VITALS — BP 138/67 | HR 60 | Resp 16 | Wt 184.4 lb

## 2022-01-13 NOTE — Progress Notes (Signed)
Patient had to leave before being seen due to transportation issues.

## 2022-01-26 ENCOUNTER — Inpatient Hospital Stay (HOSPITAL_BASED_OUTPATIENT_CLINIC_OR_DEPARTMENT_OTHER): Payer: Medicare Other | Admitting: Oncology

## 2022-01-26 ENCOUNTER — Inpatient Hospital Stay: Payer: Medicare Other

## 2022-01-26 ENCOUNTER — Encounter: Payer: Self-pay | Admitting: Oncology

## 2022-01-26 VITALS — BP 141/57 | HR 50 | Temp 97.0°F | Wt 186.0 lb

## 2022-01-26 DIAGNOSIS — C8510 Unspecified B-cell lymphoma, unspecified site: Secondary | ICD-10-CM | POA: Diagnosis present

## 2022-01-26 DIAGNOSIS — D563 Thalassemia minor: Secondary | ICD-10-CM | POA: Diagnosis not present

## 2022-01-26 DIAGNOSIS — E041 Nontoxic single thyroid nodule: Secondary | ICD-10-CM

## 2022-01-26 DIAGNOSIS — Z86718 Personal history of other venous thrombosis and embolism: Secondary | ICD-10-CM | POA: Diagnosis not present

## 2022-01-26 DIAGNOSIS — Z7901 Long term (current) use of anticoagulants: Secondary | ICD-10-CM | POA: Diagnosis not present

## 2022-01-26 DIAGNOSIS — D509 Iron deficiency anemia, unspecified: Secondary | ICD-10-CM | POA: Diagnosis not present

## 2022-01-26 LAB — CBC WITH DIFFERENTIAL/PLATELET
Abs Immature Granulocytes: 0.03 K/uL (ref 0.00–0.07)
Basophils Absolute: 0.1 K/uL (ref 0.0–0.1)
Basophils Relative: 1 %
Eosinophils Absolute: 0.2 K/uL (ref 0.0–0.5)
Eosinophils Relative: 2 %
HCT: 37.2 % (ref 36.0–46.0)
Hemoglobin: 10.5 g/dL — ABNORMAL LOW (ref 12.0–15.0)
Immature Granulocytes: 0 %
Lymphocytes Relative: 37 %
Lymphs Abs: 3.1 K/uL (ref 0.7–4.0)
MCH: 21.1 pg — ABNORMAL LOW (ref 26.0–34.0)
MCHC: 28.2 g/dL — ABNORMAL LOW (ref 30.0–36.0)
MCV: 74.7 fL — ABNORMAL LOW (ref 80.0–100.0)
Monocytes Absolute: 0.8 K/uL (ref 0.1–1.0)
Monocytes Relative: 10 %
Neutro Abs: 4.2 K/uL (ref 1.7–7.7)
Neutrophils Relative %: 50 %
Platelets: 159 K/uL (ref 150–400)
RBC: 4.98 MIL/uL (ref 3.87–5.11)
RDW: 16.7 % — ABNORMAL HIGH (ref 11.5–15.5)
WBC: 8.3 K/uL (ref 4.0–10.5)
nRBC: 0.2 % (ref 0.0–0.2)

## 2022-01-26 LAB — COMPREHENSIVE METABOLIC PANEL
ALT: 13 U/L (ref 0–44)
AST: 18 U/L (ref 15–41)
Albumin: 3.3 g/dL — ABNORMAL LOW (ref 3.5–5.0)
Alkaline Phosphatase: 88 U/L (ref 38–126)
Anion gap: 10 (ref 5–15)
BUN: 13 mg/dL (ref 8–23)
CO2: 26 mmol/L (ref 22–32)
Calcium: 8.7 mg/dL — ABNORMAL LOW (ref 8.9–10.3)
Chloride: 104 mmol/L (ref 98–111)
Creatinine, Ser: 0.7 mg/dL (ref 0.44–1.00)
GFR, Estimated: >60 mL/min (ref >60)
Glucose, Bld: 67 mg/dL — ABNORMAL LOW (ref 70–99)
Potassium: 4 mmol/L (ref 3.5–5.1)
Sodium: 140 mmol/L (ref 135–145)
Total Bilirubin: 0.4 mg/dL (ref 0.3–1.2)
Total Protein: 7.6 g/dL (ref 6.5–8.1)

## 2022-01-26 NOTE — Assessment & Plan Note (Signed)
Patient is on coumadin, dosed by pcp with a goal of INR 2-3

## 2022-01-26 NOTE — Assessment & Plan Note (Addendum)
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 Bone marrow biopsy reveals marked involvement of lymphoma.   S/p 4 rituximab treatments.   Labs are pending.  If labs are stable,Continue observation.

## 2022-01-26 NOTE — Progress Notes (Signed)
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 Bone marrow biopsy reveals marked involvement of lymphoma.   S/p 4 rituximab treatments.   Labs are pending.  If labs are stable,Continue observation.  History of DVT (deep vein thrombosis) Patient is on coumadin, dosed by pcp with a goal of INR 2-3  Microcytic anemia Due to alpha thalassemia trait.    Thyroid nodule Ordered US previously, not done.  Reschedule thyroid US  Orders Placed This Encounter  Procedures   CBC with Differential/Platelet    Standing Status:   Future    Standing Expiration Date:   01/27/2023   Comprehensive metabolic panel    Standing Status:   Future    Standing Expiration Date:   01/26/2023   Kappa/lambda light chains    Standing Status:   Future    Standing Expiration Date:   01/26/2023   Multiple Myeloma Panel (SPEP&IFE w/QIG)    Standing Status:   Future    Standing Expiration Date:   01/26/2023   Viscosity, serum    Standing Status:   Future    Standing Expiration Date:   01/27/2023   Follow up in 6 months.  All questions were answered. The patient knows to call the clinic with any problems, questions or concerns.  Earlie Server, MD, PhD [facility] Hematology Oncology 01/26/2022    HISTORY OF PRESENTING ILLNESS:  Laurie Rice is a  62 y.o.  female with PMH listed below follows up for lymphoma 06/10/2017 CBC showed elevated white count of 38.5, hemoglobin 10.2, MCV 74.7, absolute lymphocyte 30.4, basophil 0.13, Previous lab records reviewed. Leukocytosis onset of chronic, duration is since at least 2014 Anemia is also chronic since at least 2016 No aggravating or elevated factors. Patient is a poor historian.   She has a seizure disorder and meningioma.  She is not medical competent and has legal guardian.  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    Associated symptoms or signs:  Denies weight loss, fever, chills,night sweats.  Recent history of weight loss about 10 pounds over the past 1 year.  Smoking history: Never smoking History of recent oral steroid use or steroid injection: Denies History of recent infection: Denies Autoimmune disease history.  Denies  Reviewed patient's past medical history, was previously seen by Dr. Mike Gip with last visit more than 3 years ago.  Today she wants to reestablish care at [facility] site. 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  Phenotype did not suggest a specific histologic type and was not typical for CLL/SLL, mantle cell lymphoma, hairy cell leukemia or follicular cell lymphoma.  BCR ABL testing was negative.  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  Chronic microcytic anemia since 2008.  She had hemoglobin electrophoresis reviewed normal hemoglobin F and A2 Patient has a history of bilateral DVT and pulmonary embolism in 2010.  Has been on Coumadin chronically She developed right lower extremity DVT on 07/09/2014 secondary to subtherapeutic INR  01/30/2020- 02/20/2020 weekly rituximab treatment x 4  INTERVAL HISTORY Laurie Rice is a 62 y.o. female who has above history reviewed by me today presents for follow up visit for lymphoma. She  was accompanied by care giver from group home.  She no showed to her thyroid US appointment.  No reports of new complaints. Appetite is good.   .Review of Systems  Unable to perform ROS: Other (Intellectual disability)  Constitutional:  Positive for unexpected weight change. Negative for fatigue.    MEDICAL HISTORY:  Past Medical History:  Diagnosis Date   Anemia     chronic, mcv chronically low   Cellulitis    History of   History of DVT of lower extremity    bilateral on coumadin   History of lymphocytosis    Impetigo    History of   Liver masses    History of, Benign   Meningioma (HCC)    Mild mental retardation    Monoclonal gammopathy    low level   Personal history of pulmonary embolism    Seizures (HCC)    Thyroid mass    history of    SURGICAL HISTORY: Past Surgical History:  Procedure Laterality Date   NO PAST SURGERIES      SOCIAL HISTORY: Social History   Socioeconomic History   Marital status: Single    Spouse name: Not on file   Number of children: Not on file   Years of education: Not on file   Highest education level: Not on file  Occupational History   Not on file  Tobacco Use   Smoking status: Never   Smokeless tobacco: Never  Substance and Sexual Activity   Alcohol use: No   Drug use: No   Sexual activity: Not on file  Other Topics Concern   Not on file  Social History Narrative   Not on file   Social Determinants of Health   Financial Resource Strain: Not on file  Food Insecurity: Not on file  Transportation Needs: Not on file  Physical Activity: Not on file  Stress: Not on file  Social Connections: Not on file  Intimate Partner Violence: Not on file    FAMILY HISTORY: Family History  Problem Relation Age of Onset   Hypertension Other    Arthritis Other    Breast cancer Neg Hx     ALLERGIES:  is allergic to dalbavancin.  MEDICATIONS:  Current Outpatient Medications  Medication Sig Dispense Refill   Bismuth Tribromoph-Petrolatum (XEROFORM OCCLUSIVE GAUZE PATCH) PADS Apply 1 application topically daily. 50 each 0   bisoprolol-hydrochlorothiazide (ZIAC) 2.5-6.25 MG tablet Take 2 tablets by mouth daily.     dupilumab (DUPIXENT) 300 MG/2ML prefilled syringe Inject 300 mg into the skin every 14 (fourteen) days. Starting at day 15 for maintenance. 4 mL 5   Emollient (CETAPHIL) cream       ferrous sulfate 325 (65 FE) MG EC tablet Take 1 tablet (325 mg total) by mouth daily with breakfast. With orange juice 30 tablet 3   folic acid (FOLVITE) 1 MG tablet TAKE ONE TABLET BY MOUTH EACH DAY.     hydrocortisone 2.5 % cream Apply twice daily to abdominal area for two weeks. Apply 2nd 60 g 3   lamoTRIgine (LAMICTAL) 25 MG tablet Take 25 mg by mouth 2 (two) times daily.     mupirocin ointment (BACTROBAN) 2 % Discontinue all previous orders for Mupirocin and apply to aa's as directed. 22 g 3   PHENObarbital (LUMINAL) 60 MG tablet Take 60 mg by mouth at bedtime.     phenytoin (DILANTIN) 100 MG ER capsule TAKE 2 CAPSULES BY MOUTH AT BEDTIME (SEIZURE DISORDER)     warfarin (COUMADIN)  1 MG tablet Take 1 tablet (1 mg total) by mouth every Monday, Wednesday, and Friday. Take with (2) 4 mg tablets every Monday, Wednesday, and Friday for a total of 9 mg on those days. 15 tablet 0   warfarin (COUMADIN) 4 MG tablet Take 2 tablets (8 mg total) by mouth daily at 4 PM. Take 2 tablets daily for 8 mg daily. Add (1) 1 mg tablet on MWF for a total of 9 mg on those days. 60 tablet 0   Gauze Pads & Dressings (KERLIX BANDAGE ROLL 4.5"X9.3') MISC 1 application by Does not apply route daily. (Patient not taking: Reported on 09/09/2021) 100 each 0   ketoconazole (NIZORAL) 2 % cream Apply twice daily to abdominal area for 2 weeks. Apply 1st (Patient not taking: Reported on 09/09/2021) 60 g 2   mupirocin ointment (BACTROBAN) 2 % Apply 1 application. topically daily. Daily with dressing change (Patient not taking: Reported on 09/09/2021) 22 g 2   phenytoin (DILANTIN) 50 MG tablet Chew 50 mg by mouth at bedtime. (Patient not taking: Reported on 01/26/2022)     tacrolimus (PROTOPIC) 0.1 % ointment Apply topically 2 (two) times daily. (Patient not taking: Reported on 09/09/2021) 100 g 0   triamcinolone ointment (KENALOG) 0.1 % Apply 1 Application topically 2 (two) times daily. (Patient not taking: Reported on 09/09/2021)     warfarin  (COUMADIN) 1 MG tablet Take 1 tablet (1 mg total) by mouth daily. (Patient not taking: Reported on 07/26/2021) 30 tablet 11   No current facility-administered medications for this visit.     PHYSICAL EXAMINATION: ECOG PERFORMANCE STATUS: 1 - Symptomatic but completely ambulatory Vitals:   01/26/22 1056  BP: (!) 141/57  Pulse: (!) 50  Temp: (!) 97 F (36.1 C)  SpO2: 97%   Filed Weights   01/26/22 1056  Weight: 186 lb (84.4 kg)    Physical Exam Constitutional:      General: She is not in acute distress. HENT:     Head: Normocephalic and atraumatic.  Eyes:     General: No scleral icterus. Cardiovascular:     Rate and Rhythm: Normal rate.  Pulmonary:     Effort: Pulmonary effort is normal. No respiratory distress.     Breath sounds: No wheezing.  Abdominal:     General: Bowel sounds are normal. There is no distension.     Palpations: Abdomen is soft.  Musculoskeletal:        General: Swelling present. No deformity. Normal range of motion.     Cervical back: Normal range of motion and neck supple.     Comments: Bilateral lower extremity swelling, right worse than left.  Skin:    General: Skin is warm and dry.     Findings: No erythema or rash.     Comments: hyperpigmentation changes of lower extremities and multiple areas of low back  Neurological:     Mental Status: She is alert. Mental status is at baseline.  Psychiatric:        Mood and Affect: Mood normal.        Latest Ref Rng & Units 01/26/2022   10:32 AM  CMP  Glucose 70 - 99 mg/dL 67   BUN 8 - 23 mg/dL 13   Creatinine 0.44 - 1.00 mg/dL 0.70   Sodium 135 - 145 mmol/L 140   Potassium 3.5 - 5.1 mmol/L 4.0   Chloride 98 - 111 mmol/L 104   CO2 22 - 32 mmol/L 26   Calcium 8.9 -  10.3 mg/dL 8.7   Total Protein 6.5 - 8.1 g/dL 7.6   Total Bilirubin 0.3 - 1.2 mg/dL 0.4   Alkaline Phos 38 - 126 U/L 88   AST 15 - 41 U/L 18   ALT 0 - 44 U/L 13       Latest Ref Rng & Units 01/26/2022   10:32 AM  CBC  WBC 4.0  - 10.5 K/uL 8.3   Hemoglobin 12.0 - 15.0 g/dL 10.5   Hematocrit 36.0 - 46.0 % 37.2   Platelets 150 - 400 K/uL 159      RADIOGRAPHIC STUDIES: I have personally reviewed the radiological images as listed and agreed with the findings in the report. No results found.  LABORATORY DATA:  I have reviewed the data as listed    Latest Ref Rng & Units 01/26/2022   10:32 AM 07/12/2021   10:48 AM 02/28/2021    6:36 AM  CBC  WBC 4.0 - 10.5 K/uL 8.3  9.7  9.0   Hemoglobin 12.0 - 15.0 g/dL 10.5  11.8  9.9   Hematocrit 36.0 - 46.0 % 37.2  39.7  32.7   Platelets 150 - 400 K/uL 159  243  211     Recent Labs    02/26/21 1810 02/28/21 0636 07/12/21 1048 01/26/22 1032  NA 140 142 139 140  K 4.4 4.1 3.7 4.0  CL 104 107 104 104  CO2 '27 27 29 26  '$ GLUCOSE 103* 81 71 67*  BUN '16 10 16 13  '$ CREATININE 0.71 0.61 0.84 0.70  CALCIUM 8.6* 7.7* 8.8* 8.7*  GFRNONAA >60 >60 >60 >60  PROT 6.3*  --  7.8 7.6  ALBUMIN 3.3*  --  3.6 3.3*  AST 23  --  18 18  ALT 15  --  11 13  ALKPHOS 73  --  109 88  BILITOT 0.5  --  0.4 0.4    Iron/TIBC/Ferritin/ %Sat    Component Value Date/Time   IRON 99 02/13/2020 0808   IRON 94 08/22/2012 1142   TIBC 196 (L) 02/13/2020 0808   TIBC 221 (L) 08/22/2012 1142   FERRITIN 112 02/13/2020 0808   FERRITIN 65 08/22/2012 1142   IRONPCTSAT 51 (H) 02/13/2020 0808   IRONPCTSAT 43 08/22/2012 1142

## 2022-01-26 NOTE — Assessment & Plan Note (Signed)
Due to alpha thalassemia trait.

## 2022-01-26 NOTE — Assessment & Plan Note (Signed)
Ordered US previously, not done.  Reschedule thyroid US

## 2022-01-27 LAB — KAPPA/LAMBDA LIGHT CHAINS
Kappa free light chain: 7.6 mg/L (ref 3.3–19.4)
Kappa, lambda light chain ratio: 0.12 — ABNORMAL LOW (ref 0.26–1.65)
Lambda free light chains: 63.3 mg/L — ABNORMAL HIGH (ref 5.7–26.3)

## 2022-01-30 LAB — MULTIPLE MYELOMA PANEL, SERUM
Albumin SerPl Elph-Mcnc: 3.7 g/dL (ref 2.9–4.4)
Albumin/Glob SerPl: 1 (ref 0.7–1.7)
Alpha 1: 0.2 g/dL (ref 0.0–0.4)
Alpha2 Glob SerPl Elph-Mcnc: 0.7 g/dL (ref 0.4–1.0)
B-Globulin SerPl Elph-Mcnc: 0.6 g/dL — ABNORMAL LOW (ref 0.7–1.3)
Gamma Glob SerPl Elph-Mcnc: 2.5 g/dL — ABNORMAL HIGH (ref 0.4–1.8)
Globulin, Total: 4.1 g/dL — ABNORMAL HIGH (ref 2.2–3.9)
IgA: 61 mg/dL — ABNORMAL LOW (ref 87–352)
IgG (Immunoglobin G), Serum: 636 mg/dL (ref 586–1602)
IgM (Immunoglobulin M), Srm: 3250 mg/dL — ABNORMAL HIGH (ref 26–217)
M Protein SerPl Elph-Mcnc: 1.9 g/dL — ABNORMAL HIGH
Total Protein ELP: 7.8 g/dL (ref 6.0–8.5)

## 2022-01-30 LAB — VISCOSITY, SERUM: Viscosity, Serum: 2.1 rel.saline (ref 1.4–2.1)

## 2022-01-31 ENCOUNTER — Encounter (INDEPENDENT_AMBULATORY_CARE_PROVIDER_SITE_OTHER): Payer: Self-pay | Admitting: Vascular Surgery

## 2022-01-31 ENCOUNTER — Ambulatory Visit (INDEPENDENT_AMBULATORY_CARE_PROVIDER_SITE_OTHER): Payer: Medicare Other | Admitting: Vascular Surgery

## 2022-01-31 VITALS — BP 137/76 | HR 62 | Resp 16 | Wt 186.0 lb

## 2022-01-31 DIAGNOSIS — I87009 Postthrombotic syndrome without complications of unspecified extremity: Secondary | ICD-10-CM | POA: Diagnosis not present

## 2022-01-31 DIAGNOSIS — I890 Lymphedema, not elsewhere classified: Secondary | ICD-10-CM | POA: Diagnosis not present

## 2022-01-31 DIAGNOSIS — I10 Essential (primary) hypertension: Secondary | ICD-10-CM | POA: Diagnosis not present

## 2022-01-31 NOTE — Assessment & Plan Note (Signed)
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  The lymphedema pump and her compression socks have gotten this under much better control.  Continue these modalities.  Return to clinic in 1 year.

## 2022-01-31 NOTE — Progress Notes (Signed)
MRN : [id number]  Laurie Rice is a 62 y.o. (Aug 13, 1960) female who presents with chief complaint of  Chief Complaint  Patient presents with   Follow-up    3 month follow up  .  History of Present Illness: Patient returns today in follow up of her lymphedema and leg swelling.  She lives in the facility and they have been doing her lymphedema pump for roughly 60 minutes every evening.  Has been wearing her compression socks diligently.  She has been elevating her legs.  This has resulted in marked improvement in the swelling in her legs.  Occurred, the swelling is fairly mild.  No recurrent ulceration or infection.  No fevers or chills.  Patient is a poor historian.  Current Outpatient Medications  Medication Sig Dispense Refill   Bismuth Tribromoph-Petrolatum (XEROFORM OCCLUSIVE GAUZE PATCH) PADS Apply 1 application topically daily. 50 each 0   bisoprolol-hydrochlorothiazide (ZIAC) 2.5-6.25 MG tablet Take 2 tablets by mouth daily.     dupilumab (DUPIXENT) 300 MG/2ML prefilled syringe Inject 300 mg into the skin every 14 (fourteen) days. Starting at day 15 for maintenance. 4 mL 5   Emollient (CETAPHIL) cream      ferrous sulfate 325 (65 FE) MG EC tablet Take 1 tablet (325 mg total) by mouth daily with breakfast. With orange juice 30 tablet 3   folic acid (FOLVITE) 1 MG tablet TAKE ONE TABLET BY MOUTH EACH DAY.     hydrocortisone 2.5 % cream Apply twice daily to abdominal area for two weeks. Apply 2nd 60 g 3   lamoTRIgine (LAMICTAL) 25 MG tablet Take 25 mg by mouth 2 (two) times daily. '25mg'$  in the morning and '50mg'$  in the evening     mupirocin ointment (BACTROBAN) 2 % Discontinue all previous orders for Mupirocin and apply to aa's as directed. 22 g 3   PHENObarbital (LUMINAL) 60 MG tablet Take 60 mg by mouth at bedtime.     phenytoin (DILANTIN) 100 MG ER capsule TAKE 2 CAPSULES BY MOUTH AT BEDTIME (SEIZURE DISORDER)     warfarin (COUMADIN) 1 MG tablet Take 1 tablet (1 mg total) by  mouth every Monday, Wednesday, and Friday. Take with (2) 4 mg tablets every Monday, Wednesday, and Friday for a total of 9 mg on those days. 15 tablet 0   warfarin (COUMADIN) 4 MG tablet Take 2 tablets (8 mg total) by mouth daily at 4 PM. Take 2 tablets daily for 8 mg daily. Add (1) 1 mg tablet on MWF for a total of 9 mg on those days. 60 tablet 0   Gauze Pads & Dressings (KERLIX BANDAGE ROLL 4.5"X9.3') MISC 1 application by Does not apply route daily. (Patient not taking: Reported on 09/09/2021) 100 each 0   ketoconazole (NIZORAL) 2 % cream Apply twice daily to abdominal area for 2 weeks. Apply 1st (Patient not taking: Reported on 09/09/2021) 60 g 2   mupirocin ointment (BACTROBAN) 2 % Apply 1 application. topically daily. Daily with dressing change (Patient not taking: Reported on 09/09/2021) 22 g 2   phenytoin (DILANTIN) 50 MG tablet Chew 50 mg by mouth at bedtime. (Patient not taking: Reported on 01/26/2022)     tacrolimus (PROTOPIC) 0.1 % ointment Apply topically 2 (two) times daily. (Patient not taking: Reported on 09/09/2021) 100 g 0   triamcinolone ointment (KENALOG) 0.1 % Apply 1 Application topically 2 (two) times daily. (Patient not taking: Reported on 09/09/2021)     warfarin (COUMADIN) 1 MG tablet Take 1 tablet (  1 mg total) by mouth daily. (Patient not taking: Reported on 07/26/2021) 30 tablet 11   No current facility-administered medications for this visit.    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   History of lymphocytosis    Impetigo    History of   Liver masses    History of, Benign   Meningioma (HCC)    Mild mental retardation    Monoclonal gammopathy    low level   Personal history of pulmonary embolism    Seizures (HCC)    Thyroid mass    history of    Past Surgical History:  Procedure Laterality Date   NO PAST SURGERIES       Social  History   Tobacco Use   Smoking status: Never   Smokeless tobacco: Never  Substance Use Topics   Alcohol use: No   Drug use: No       Family History  Problem Relation Age of Onset   Hypertension Other    Arthritis Other    Breast cancer Neg Hx      Allergies  Allergen Reactions   Dalbavancin Rash    Widespread rash with no systemic symptoms     REVIEW OF SYSTEMS (Negative unless checked)  Constitutional: '[]'$ Weight loss  '[]'$ Fever  '[]'$ Chills Cardiac: '[]'$ Chest pain   '[]'$ Chest pressure   '[]'$ Palpitations   '[]'$ Shortness of breath when laying flat   '[]'$ Shortness of breath at rest   '[]'$ Shortness of breath with exertion. Vascular:  '[]'$ Pain in legs with walking   '[]'$ Pain in legs at rest   '[]'$ Pain in legs when laying flat   '[]'$ Claudication   '[]'$ Pain in feet when walking  '[]'$ Pain in feet at rest  '[]'$ Pain in feet when laying flat   '[x]'$ History of DVT   '[x]'$ Phlebitis   '[x]'$ Swelling in legs   '[]'$ Varicose veins   '[]'$ Non-healing ulcers Pulmonary:   '[]'$ Uses home oxygen   '[]'$ Productive cough   '[]'$ Hemoptysis   '[]'$ Wheeze  '[]'$ COPD   '[]'$ Asthma Neurologic:  '[]'$ Dizziness  '[]'$ Blackouts   '[x]'$ Seizures   '[]'$ History of stroke   '[]'$ History of TIA  '[]'$ Aphasia   '[]'$ Temporary blindness   '[]'$ Dysphagia   '[]'$ Weakness or numbness in arms   '[]'$ Weakness or numbness in legs Musculoskeletal:  '[x]'$ Arthritis   '[]'$ Joint swelling   '[x]'$ Joint pain   '[]'$ Low back pain Hematologic:  '[]'$ Easy bruising  '[]'$ Easy bleeding   '[]'$ Hypercoagulable state   '[x]'$ Anemic   Gastrointestinal:  '[]'$ Blood in stool   '[]'$ Vomiting blood  '[]'$ Gastroesophageal reflux/heartburn   '[]'$ Abdominal pain Genitourinary:  '[]'$ Chronic kidney disease   '[]'$ Difficult urination  '[]'$ Frequent urination  '[]'$ Burning with urination   '[]'$ Hematuria Skin:  '[]'$ Rashes   '[]'$ Ulcers   '[]'$ Wounds Psychological:  '[]'$ History of anxiety   '[]'$  History of major depression.  Physical Examination  BP 137/76 (BP Location: Left Arm)   Pulse 62   Resp 16   Wt 186 lb (84.4 kg)   BMI 34.02 kg/m  Gen:  WD/WN, NAD Head: [place]/AT, No temporalis  wasting. Ear/Nose/Throat: Hearing grossly intact, nares w/o erythema or drainage Eyes: Conjunctiva clear. Sclera non-icteric Neck: Supple.  Trachea midline Pulmonary:  Good air movement, no use of accessory muscles.  Cardiac: RRR, no JVD Vascular:  Vessel Right Left  Radial Palpable Palpable           Musculoskeletal: M/S 5/5 throughout.  No deformity or atrophy.  1+ bilateral lower extremity edema. Neurologic: Sensation grossly intact in extremities.  Symmetrical.  Speech is limited Psychiatric: Judgment and insight are poor.  Patient is a poor historian. Dermatologic: No rashes or ulcers noted.  No cellulitis or open wounds.      Labs Recent Results (from the past 2160 hour(s))  Viscosity, serum     Status: None   Collection Time: 01/26/22 10:32 AM  Result Value Ref Range   Viscosity, Serum 2.1 1.4 - 2.1 rel.saline    Comment: (NOTE) This test was developed and its performance characteristics determined by [facility]. It has not been cleared or approved by the Food and Drug Administration. Values above 2.7 may indicate paraproteinemia is present. Performed At: [facility] [place], [place] [id number] Rush Farmer MD [id number]:[id number]   Multiple Myeloma Panel (SPEP&IFE w/QIG)     Status: Abnormal   Collection Time: 01/26/22 10:32 AM  Result Value Ref Range   IgG (Immunoglobin G), Serum 636 586 - 1,602 mg/dL   IgA 61 (L) 87 - 352 mg/dL   IgM (Immunoglobulin M), Srm 3,250 (H) 26 - 217 mg/dL    Comment: (NOTE) Results confirmed on dilution.    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    Please Note Comment     Comment: (NOTE) Protein electrophoresis scan will follow via computer, mail, or courier delivery. Performed At: [facility] [place], [place] [id number] Rush Farmer MD [id number]   Kappa/lambda light chains     Status: Abnormal   Collection Time: 01/26/22 10:32 AM  Result Value Ref Range   Kappa free light chain 7.6 3.3 - 19.4 mg/L   Lambda free light chains 63.3 (H) 5.7 - 26.3 mg/L   Kappa, lambda light chain ratio 0.12 (L) 0.26 - 1.65    Comment: (NOTE) Performed At: [facility] [place], [place] [id number] Rush Farmer MD [id number]   Comprehensive metabolic panel     Status: Abnormal   Collection Time: 01/26/22 10:32 AM  Result Value Ref Range   Sodium 140 135 - 145 mmol/L   Potassium 4.0 3.5 - 5.1 mmol/L   Chloride 104 98 - 111 mmol/L   CO2 26 22 - 32 mmol/L   Glucose, Bld 67 (L) 70 - 99 mg/dL    Comment: Glucose reference range applies only to samples taken after fasting for at least 8 hours.   BUN 13 8 - 23 mg/dL   Creatinine, Ser 0.70 0.44 - 1.00 mg/dL   Calcium 8.7 (L) 8.9 - 10.3 mg/dL   Total Protein 7.6 6.5 - 8.1 g/dL   Albumin 3.3 (L) 3.5 - 5.0 g/dL   AST 18 15 - 41 U/L   ALT 13 0 - 44 U/L   Alkaline Phosphatase 88 38 - 126 U/L   Total Bilirubin 0.4 0.3 - 1.2  mg/dL   GFR, Estimated >60 >60 mL/min    Comment: (NOTE) Calculated using the CKD-EPI Creatinine Equation (2021)    Anion gap 10 5 - 15    Comment: Performed at [facility], [address] [place], [place] [address]  CBC with Differential/Platelet     Status: Abnormal   Collection Time: 01/26/22 10:32 AM  Result Value Ref Range   WBC 8.3 4.0 - 10.5 K/uL   RBC 4.98 3.87 - 5.11 MIL/uL   Hemoglobin 10.5 (L) 12.0 - 15.0 g/dL    Comment: Reticulocyte Hemoglobin testing may be clinically  indicated, consider ordering this additional test [id number]    HCT 37.2 36.0 - 46.0 %   MCV 74.7 (L) 80.0 - 100.0 fL   MCH 21.1 (L) 26.0 - 34.0 pg   MCHC 28.2 (L) 30.0 - 36.0 g/dL   RDW 16.7 (H) 11.5 - 15.5 %   Platelets 159 150 - 400 K/uL    Comment: SPECIMEN CHECKED FOR CLOTS PLATELET COUNT CONFIRMED BY SMEAR    nRBC 0.2 0.0 - 0.2 %   Neutrophils Relative % 50 %   Neutro Abs 4.2 1.7 - 7.7 K/uL   Lymphocytes Relative 37 %   Lymphs Abs 3.1 0.7 - 4.0 K/uL   Monocytes Relative 10 %   Monocytes Absolute 0.8 0.1 - 1.0 K/uL   Eosinophils Relative 2 %   Eosinophils Absolute 0.2 0.0 - 0.5 K/uL   Basophils Relative 1 %   Basophils Absolute 0.1 0.0 - 0.1 K/uL   Immature Granulocytes 0 %   Abs Immature Granulocytes 0.03 0.00 - 0.07 K/uL    Comment: Performed at [facility], [address], [place], [place] [address]    Radiology No results found.  Assessment/Plan  Lymphedema The patient's lymphedema is currently under pretty good control with compression socks, elevation, and the use of lymphedema pump daily.  This has resulted in significant improvement in the swelling in her legs.  We will continue these conservative measures.  No further workup is planned at this point.  Return to clinic in 1 year.  Essential hypertension blood pressure control important in reducing the progression of atherosclerotic disease. On appropriate oral medications.   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  The lymphedema pump and her compression socks have gotten this under much better control.  Continue these modalities.  Return to clinic in 1 year.    Leotis Pain, MD  01/31/2022 1:01 PM    This note was created with Dragon medical transcription system.  Any errors from dictation are purely unintentional

## 2022-01-31 NOTE — Patient Instructions (Signed)
Lymphedema ? ?Lymphedema is swelling that is caused by the abnormal collection of lymph in the tissues under the skin. Lymph is excess fluid from the tissues in your body that is removed through the lymphatic system. This system is part of your body's defense system (immune system) and includes lymph nodes and lymph vessels. The lymph vessels collect and carry the excess fluid, fats, proteins, and waste from the tissues of the body to the bloodstream. This system also works to clean and remove bacteria and waste products from the body. ?Lymphedema occurs when the lymphatic system is blocked. When the lymph vessels or lymph nodes are blocked or damaged, lymph does not drain properly. This causes an abnormal buildup of lymph, which leads to swelling in the affected area. This may include the trunk area, or an arm or leg. Lymphedema cannot be cured by medicines, but various methods can be used to help reduce the swelling. ?What are the causes? ?The cause of this condition depends on the type of lymphedema that you have. ?Primary lymphedema is caused by the absence of lymph vessels or having abnormal lymph vessels at birth. ?Secondary lymphedema occurs when lymph vessels are blocked or damaged. Secondary lymphedema is more common. Common causes of lymph vessel blockage include: ?Skin infection, such as cellulitis. ?Infection by parasites (filariasis). ?Injury. ?Radiation therapy. ?Cancer. ?Formation of scar tissue. ?Surgery. ?What are the signs or symptoms? ?Symptoms of this condition include: ?Swelling of the arm or leg. ?A heavy or tight feeling in the arm or leg. ?Swelling of the feet, toes, or fingers. Shoes or rings may fit more tightly than before. ?Redness of the skin over the affected area. ?Limited movement of the affected limb. ?Sensitivity to touch or discomfort in the affected limb. ?How is this diagnosed? ?This condition may be diagnosed based on: ?Your symptoms and medical history. ?A physical  exam. ?Bioimpedance spectroscopy. In this test, painless electrical currents are used to measure fluid levels in your body. ?Imaging tests, such as: ?MRI. ?CT scan. ?Duplex ultrasound. This test uses sound waves to produce images of the vessels and the blood flow on a screen. ?Lymphoscintigraphy. In this test, a low dose of a radioactive substance is injected to trace the flow of lymph through your lymph vessels. ?Lymphangiography. In this test, a contrast dye is injected into the lymph vessel to help show blockages. ?How is this treated? ? ?If an underlying condition is causing the lymphedema, that condition will be treated. For example, antibiotic medicines may be used to treat an infection. ?Treatment for this condition will depend on the cause of your lymphedema. Treatment may include: ?Complete decongestive therapy (CDT). This is done by a certified lymphedema therapist to reduce fluid congestion. This therapy includes: ?Skin care. ?Compression wrapping of the affected area. ?Manual lymph drainage. This is a special massage technique that promotes lymph drainage out of a limb. ?Specific exercises. Certain exercises can help fluid move out of the affected limb. ?Compression. Various methods may be used to apply pressure to the affected limb to reduce the swelling. They include: ?Wearing compression stockings or sleeves on the affected limb. ?Wrapping the affected limb with special bandages. ?Surgery. This is usually done for severe cases only. For example, surgery may be done if you have trouble moving the limb or if the swelling does not get better with other treatments. ?Follow these instructions at home: ?Self-care ?The affected area is more likely to become injured or infected. Take these steps to help prevent infection: ?Keep the affected   area clean and dry. ?Use approved creams or lotions to keep the skin moisturized. ?Protect your skin from cuts: ?Use gloves while cooking or gardening. ?Do not walk  barefoot. ?If you shave the affected area, use an electric razor. ?Do not wear tight clothes, shoes, or jewelry. ?Eat a healthy diet that includes a lot of fruits and vegetables. ?Activity ?Do exercises as told by your health care provider. ?Do not sit with your legs crossed. ?When possible, keep the affected limb raised (elevated) above the level of your heart. ?Avoid carrying things with an arm that is affected by lymphedema. ?General instructions ?Wear compression stockings or sleeves as told by your health care provider. ?Note any changes in size of the affected limb. You may be instructed to take regular measurements and keep track of them. ?Take over-the-counter and prescription medicines only as told by your health care provider. ?If you were prescribed an antibiotic medicine, take or apply it as told by your health care provider. Do not stop using the antibiotic even if you start to feel better or if your condition improves. ?Do not use heating pads or ice packs on the affected area. ?Avoid having blood draws, IV insertions, or blood pressure checks on the affected limb. ?Keep all follow-up visits. This is important. ?Contact a health care provider if you: ?Continue to have swelling in your limb. ?Have fluid leaking from the skin of your swollen limb. ?Have a cut that does not heal. ?Have redness or pain in the affected area. ?Develop purplish spots, rash, blisters, or sores (lesions) on your affected limb. ?Get help right away if you: ?Have new swelling in your limb that starts suddenly. ?Have shortness of breath or chest pain. ?Have a fever or chills. ?These symptoms may represent a serious problem that is an emergency. Do not wait to see if the symptoms will go away. Get medical help right away. Call your local emergency services (911 in the [place] Do not drive yourself to the hospital. ?Summary ?Lymphedema is swelling that is caused by the abnormal collection of lymph in the tissues under the  skin. ?Lymph is fluid from the tissues in your body that is removed through the lymphatic system. This system collects and carries excess fluid, fats, proteins, and wastes from the tissues of the body to the bloodstream. ?Lymphedema causes swelling, pain, and redness in the affected area. This may include the trunk area, or an arm or leg. ?Treatment for this condition may depend on the cause of your lymphedema. Treatment may include treating the underlying cause, complete decongestive therapy (CDT), compression methods, or surgery. ?This information is not intended to replace advice given to you by your health care provider. Make sure you discuss any questions you have with your health care provider. ?Document Revised: 10/15/2019 Document Reviewed: 10/15/2019 ?Elsevier Patient Education ? 2023 [employer] ? ?

## 2022-01-31 NOTE — Assessment & Plan Note (Signed)
The patient's lymphedema is currently under pretty good control with compression socks, elevation, and the use of lymphedema pump daily.  This has resulted in significant improvement in the swelling in her legs.  We will continue these conservative measures.  No further workup is planned at this point.  Return to clinic in 1 year.

## 2022-01-31 NOTE — Assessment & Plan Note (Signed)
blood pressure control important in reducing the progression of atherosclerotic disease. On appropriate oral medications.  

## 2022-02-06 ENCOUNTER — Other Ambulatory Visit: Payer: Medicare Other

## 2022-03-15 ENCOUNTER — Ambulatory Visit: Payer: Medicare Other

## 2022-03-16 ENCOUNTER — Ambulatory Visit
Admission: RE | Admit: 2022-03-16 | Discharge: 2022-03-16 | Disposition: A | Discharge status: Home / Self Care | Payer: Medicare Other | Source: Ambulatory Visit | Attending: Oncology | Admitting: Oncology

## 2022-03-16 DIAGNOSIS — E041 Nontoxic single thyroid nodule: Secondary | ICD-10-CM | POA: Insufficient documentation

## 2022-04-03 ENCOUNTER — Ambulatory Visit: Payer: Medicare Other | Admitting: Family

## 2022-04-03 ENCOUNTER — Encounter
Admission: RE | Disposition: A | Discharge status: Home / Self Care | Payer: Self-pay | Source: Home / Self Care | Attending: Gastroenterology

## 2022-04-03 ENCOUNTER — Ambulatory Visit
Admission: RE | Admit: 2022-04-03 | Discharge: 2022-04-03 | Disposition: A | Discharge status: Home / Self Care | Payer: Medicare Other | Attending: Gastroenterology | Admitting: Gastroenterology

## 2022-04-03 ENCOUNTER — Encounter: Payer: Self-pay | Admitting: *Deleted

## 2022-04-03 DIAGNOSIS — Z86718 Personal history of other venous thrombosis and embolism: Secondary | ICD-10-CM | POA: Insufficient documentation

## 2022-04-03 DIAGNOSIS — Z7901 Long term (current) use of anticoagulants: Secondary | ICD-10-CM | POA: Insufficient documentation

## 2022-04-03 DIAGNOSIS — D123 Benign neoplasm of transverse colon: Secondary | ICD-10-CM | POA: Diagnosis not present

## 2022-04-03 DIAGNOSIS — D122 Benign neoplasm of ascending colon: Secondary | ICD-10-CM | POA: Insufficient documentation

## 2022-04-03 DIAGNOSIS — Z86711 Personal history of pulmonary embolism: Secondary | ICD-10-CM | POA: Insufficient documentation

## 2022-04-03 DIAGNOSIS — F70 Mild intellectual disabilities: Secondary | ICD-10-CM | POA: Insufficient documentation

## 2022-04-03 DIAGNOSIS — E079 Disorder of thyroid, unspecified: Secondary | ICD-10-CM | POA: Diagnosis not present

## 2022-04-03 DIAGNOSIS — K640 First degree hemorrhoids: Secondary | ICD-10-CM | POA: Diagnosis not present

## 2022-04-03 DIAGNOSIS — I10 Essential (primary) hypertension: Secondary | ICD-10-CM | POA: Insufficient documentation

## 2022-04-03 DIAGNOSIS — Z1211 Encounter for screening for malignant neoplasm of colon: Secondary | ICD-10-CM | POA: Insufficient documentation

## 2022-04-03 DIAGNOSIS — D120 Benign neoplasm of cecum: Secondary | ICD-10-CM | POA: Insufficient documentation

## 2022-04-03 HISTORY — PX: COLONOSCOPY WITH PROPOFOL: SHX5780

## 2022-04-03 SURGERY — COLONOSCOPY WITH PROPOFOL
Anesthesia: General

## 2022-04-03 MED ORDER — SODIUM CHLORIDE 0.9 % IV SOLN: INTRAVENOUS | Status: DC

## 2022-04-03 MED ORDER — PROPOFOL 1000 MG/100ML IV EMUL
INTRAVENOUS | Status: AC
  Filled 2022-04-03: qty 100

## 2022-04-03 MED ORDER — PROPOFOL 500 MG/50ML IV EMUL
INTRAVENOUS | Status: DC | PRN
  Administered 2022-04-03: 40 mg via INTRAVENOUS
  Administered 2022-04-03: 100 ug/kg/min via INTRAVENOUS

## 2022-04-03 NOTE — Op Note (Signed)
[facility] Gastroenterology Patient Name: Laurie Rice Procedure Date: 04/03/2022 9:00 AM MRN: [id number] Account #: [id number] Date of Birth: [birth year] Admit Type: Outpatient Age: 62 Room: [facility] ENDO ROOM 3 Gender: Female Note Status: Finalized Instrument Name: Colonoscope [id number] Procedure:             Colonoscopy Indications:           Screening for colorectal malignant neoplasm Providers:             Andrey Farmer MD, MD Referring MD:          Youlanda Roys. Lovie Macadamia, MD (Referring MD) Medicines:             Monitored Anesthesia Care Complications:         No immediate complications. Estimated blood loss:                         Minimal. Procedure:             Pre-Anesthesia Assessment:                        - Prior to the procedure, a History and Physical was                         performed, and patient medications and allergies were                         reviewed. The patient is competent. The risks and                         benefits of the procedure and the sedation options and                         risks were discussed with the patient. All questions                         were answered and informed consent was obtained.                         Patient identification and proposed procedure were                         verified by the physician, the nurse, the                         anesthesiologist, the anesthetist and the technician                         in the endoscopy suite. Mental Status Examination:                         alert and oriented. Airway Examination: normal                         oropharyngeal airway and neck mobility. Respiratory                         Examination: clear to auscultation. CV Examination:  normal. Prophylactic Antibiotics: The patient does not                         require prophylactic antibiotics. Prior                         Anticoagulants: The patient has taken Lovenox                          (enoxaparin), last dose was 1 day prior to procedure.                         ASA Grade Assessment: III - A patient with severe                         systemic disease. After reviewing the risks and                         benefits, the patient was deemed in satisfactory                         condition to undergo the procedure. The anesthesia                         plan was to use monitored anesthesia care (MAC).                         Immediately prior to administration of medications,                         the patient was re-assessed for adequacy to receive                         sedatives. The heart rate, respiratory rate, oxygen                         saturations, blood pressure, adequacy of pulmonary                         ventilation, and response to care were monitored                         throughout the procedure. The physical status of the                         patient was re-assessed after the procedure.                        After obtaining informed consent, the colonoscope was                         passed under direct vision. Throughout the procedure,                         the patient's blood pressure, pulse, and oxygen                         saturations were monitored continuously. The  Colonoscope was introduced through the anus and                         advanced to the the cecum, identified by appendiceal                         orifice and ileocecal valve. The colonoscopy was                         performed without difficulty. The patient tolerated                         the procedure well. The quality of the bowel                         preparation was adequate to identify polyps. The                         ileocecal valve, appendiceal orifice, and rectum were                         photographed. Findings:      The perianal and digital rectal examinations were normal.      Two sessile polyps were found in  the cecum. The polyps were 2 to 4 mm in       size. These polyps were removed with a cold snare. Resection and       retrieval were complete. Estimated blood loss was minimal.      A 10 mm polyp was found in the distal ascending colon. The polyp was       flat. Preparations were made for mucosal resection. Demarcation of the       lesion was performed with narrow band imaging to clearly identify the       boundaries of the lesion. Eleview was injected to raise the lesion.       Snare mucosal resection was performed. Resection and retrieval were       complete. Resected tissue margins were examined and clear of polyp       tissue. To prevent bleeding post-intervention, two hemostatic clips were       successfully placed. There was no bleeding during, or at the end, of the       procedure.      A 1 mm polyp was found in the transverse colon. The polyp was sessile.       The polyp was removed with a jumbo cold forceps. Resection and retrieval       were complete. Estimated blood loss was minimal.      Internal hemorrhoids were found during retroflexion. The hemorrhoids       were Grade I (internal hemorrhoids that do not prolapse).      The exam was otherwise without abnormality on direct and retroflexion       views. Impression:            - Two 2 to 4 mm polyps in the cecum, removed with a                         cold snare. Resected and retrieved.                        -  One 10 mm polyp in the distal ascending colon,                         removed with mucosal resection. Resected and                         retrieved. Clips were placed.                        - One 1 mm polyp in the transverse colon, removed with                         a jumbo cold forceps. Resected and retrieved.                        - Internal hemorrhoids.                        - The examination was otherwise normal on direct and                         retroflexion views.                        - Mucosal  resection was performed. Resection and                         retrieval were complete. Recommendation:        - Discharge patient to home.                        - Resume previous diet.                        - Continue present medications.                        - Resume Coumadin (warfarin) today and Lovenox                         (enoxaparin) tomorrow at prior doses. Refer to                         managing physician for further adjustment of therapy.                        - Await pathology results.                        - Repeat colonoscopy in 3 years for surveillance.                        - Return to referring physician as previously                         scheduled. Procedure Code(s):     --- Professional ---                        [phone], Colonoscopy, flexible; with endoscopic mucosal  resection                        [id number], 97, Colonoscopy, flexible; with removal of                         tumor(s), polyp(s), or other lesion(s) by snare                         technique                        [id number], 70, Colonoscopy, flexible; with biopsy, single                         or multiple Diagnosis Code(s):     --- Professional ---                        Z12.11, Encounter for screening for malignant neoplasm                         of colon                        D12.0, Benign neoplasm of cecum                        D12.2, Benign neoplasm of ascending colon                        D12.3, Benign neoplasm of transverse colon (hepatic                         flexure or splenic flexure)                        K64.0, First degree hemorrhoids CPT copyright 2022 [employer]. All rights reserved. The codes documented in this report are preliminary and upon coder review may  be revised to meet current compliance requirements. Andrey Farmer MD, MD 04/03/2022 10:43:13 AM Number of Addenda: 0 Note Initiated On: 04/03/2022 9:00 AM Scope Withdrawal  Time: 0 hours 24 minutes 44 seconds  Total Procedure Duration: 0 hours 31 minutes 55 seconds  Estimated Blood Loss:  Estimated blood loss was minimal.      [facility]

## 2022-04-03 NOTE — Transfer of Care (Signed)
Immediate Anesthesia Transfer of Care Note  Patient: Laurie Rice  Procedure(s) Performed: COLONOSCOPY WITH PROPOFOL  Patient Location: PACU  Anesthesia Type:General  Level of Consciousness: awake, alert , and oriented  Airway & Oxygen Therapy: Patient Spontanous Breathing  Post-op Assessment: Report given to RN and Post -op Vital signs reviewed and stable  Post vital signs: Reviewed and stable  Last Vitals:  Vitals Value Taken Time  BP 126/55 04/03/22 1038  Temp 36.1 C 04/03/22 1038  Pulse 61 04/03/22 1038  Resp 15 04/03/22 1038  SpO2 100 % 04/03/22 1038    Last Pain:  Vitals:   04/03/22 1038  TempSrc: Temporal  PainSc: 0-No pain         Complications: No notable events documented.

## 2022-04-03 NOTE — Anesthesia Postprocedure Evaluation (Signed)
Anesthesia Post Note  Patient: Rosemond A Sampath  Procedure(s) Performed: COLONOSCOPY WITH PROPOFOL  Patient location during evaluation: PACU Anesthesia Type: General Level of consciousness: awake and alert Pain management: pain level controlled Vital Signs Assessment: post-procedure vital signs reviewed and stable Respiratory status: spontaneous breathing, nonlabored ventilation, respiratory function stable and patient connected to nasal cannula oxygen Cardiovascular status: blood pressure returned to baseline and stable Postop Assessment: no apparent nausea or vomiting Anesthetic complications: no   No notable events documented.   Last Vitals:  Vitals:   04/03/22 0923 04/03/22 1038  BP: (!) 167/74 (!) 126/55  Pulse: (!) 52 61  Resp: 16 15  Temp: (!) 35.3 C (!) 36.1 C  SpO2: 100% 100%    Last Pain:  Vitals:   04/03/22 1038  TempSrc: Temporal  PainSc: 0-No pain                 Molli Barrows

## 2022-04-03 NOTE — Interval H&P Note (Signed)
History and Physical Interval Note:  04/03/2022 9:46 AM  Laurie Rice  has presented today for surgery, with the diagnosis of CCA SCREEN.  The various methods of treatment have been discussed with the patient and family. After consideration of risks, benefits and other options for treatment, the patient has consented to  Procedure(s): COLONOSCOPY WITH PROPOFOL (N/A) as a surgical intervention.  The patient's history has been reviewed, patient examined, no change in status, stable for surgery.  I have reviewed the patient's chart and labs.  Questions were answered to the patient's satisfaction.     Lesly Rubenstein  Ok to proceed with colonoscopy

## 2022-04-03 NOTE — Anesthesia Preprocedure Evaluation (Signed)
Anesthesia Evaluation  Patient identified by MRN, date of birth, ID band Patient awake    Reviewed: Allergy & Precautions, H&P , NPO status , Patient's Chart, lab work & pertinent test results, reviewed documented beta blocker date and time   Airway Mallampati: II   Neck ROM: full    Dental  (+) Poor Dentition   Pulmonary neg pulmonary ROS   Pulmonary exam normal        Cardiovascular Exercise Tolerance: Poor hypertension, negative cardio ROS Normal cardiovascular exam Rhythm:regular Rate:Normal     Neuro/Psych Seizures -,  negative neurological ROS  negative psych ROS   GI/Hepatic negative GI ROS, Neg liver ROS,,,  Endo/Other  negative endocrine ROS    Renal/GU negative Renal ROS  negative genitourinary   Musculoskeletal   Abdominal   Peds  Hematology negative hematology ROS (+) Blood dyscrasia, anemia   Anesthesia Other Findings Past Medical History: No date: Anemia     Comment:  chronic, mcv chronically low No date: Cellulitis     Comment:  History of No date: 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 No date: History of lymphocytosis No date: Impetigo     Comment:  History of No date: Liver masses     Comment:  History of, Benign No date: Meningioma 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 No date: Monoclonal gammopathy     Comment:  low level No date: Personal history of pulmonary embolism No date: Seizures No date: Thyroid mass     Comment:  history of Past Surgical History: No date: NO PAST SURGERIES BMI    Body Mass Index: 32.24 kg/m     Reproductive/Obstetrics negative OB ROS                             Anesthesia Physical Anesthesia Plan  ASA: 3  Anesthesia Plan: General   Post-op Pain Management:    Induction:   PONV Risk Score and Plan:   Airway Management Planned:   Additional Equipment:   Intra-op Plan:   Post-operative  Plan:   Informed Consent: I have reviewed the patients History and Physical, chart, labs and discussed the procedure including the risks, benefits and alternatives for the proposed anesthesia with the patient or authorized representative who has indicated his/her understanding and acceptance.     Dental Advisory Given  Plan Discussed with: CRNA  Anesthesia Plan Comments:        Anesthesia Quick Evaluation

## 2022-04-03 NOTE — H&P (Signed)
Outpatient short stay form Pre-procedure 04/03/2022  Lesly Rubenstein, MD  Primary Physician: Juluis Pitch, MD  Reason for visit:  Screening  History of present illness:    62 y/o lady with history of hypertension, DVT on coumadin (stopped 5 days ago and bridged with lovenox with last dose yesterday morning), and mental handicap here for index screening colonoscopy. No known family history of GI malignancies. No abdominal surgeries.    Current Facility-Administered Medications:    0.9 %  sodium chloride infusion, , Intravenous, Continuous, Margi Edmundson, Hilton Cork, MD  Medications Prior to Admission  Medication Sig Dispense Refill Last Dose   bisoprolol-hydrochlorothiazide (ZIAC) 2.5-6.25 MG tablet Take 2 tablets by mouth daily.   04/02/2022   enoxaparin (LOVENOX) 120 MG/0.8ML injection Inject 120 mg into the skin daily. Take 4 days prior to procedure on 04/03/22 and 3 days after the procedure. Do not take the day of the procedure   04/02/2022   ferrous sulfate 325 (65 FE) MG EC tablet Take 1 tablet (325 mg total) by mouth daily with breakfast. With orange juice 30 tablet 3 Past Week   folic acid (FOLVITE) 1 MG tablet TAKE ONE TABLET BY MOUTH EACH DAY.   04/02/2022   PHENObarbital (LUMINAL) 60 MG tablet Take 60 mg by mouth at bedtime.   04/02/2022   phenytoin (DILANTIN) 100 MG ER capsule TAKE 2 CAPSULES BY MOUTH AT BEDTIME (SEIZURE DISORDER)   04/02/2022   warfarin (COUMADIN) 1 MG tablet Take 1 tablet (1 mg total) by mouth every Monday, Wednesday, and Friday. Take with (2) 4 mg tablets every Monday, Wednesday, and Friday for a total of 9 mg on those days. 15 tablet 0 Past Week   warfarin (COUMADIN) 4 MG tablet Take 2 tablets (8 mg total) by mouth daily at 4 PM. Take 2 tablets daily for 8 mg daily. Add (1) 1 mg tablet on MWF for a total of 9 mg on those days. 60 tablet 0 Past Week   Bismuth Tribromoph-Petrolatum (XEROFORM OCCLUSIVE GAUZE PATCH) PADS Apply 1 application topically daily. 50 each 0     dupilumab (DUPIXENT) 300 MG/2ML prefilled syringe Inject 300 mg into the skin every 14 (fourteen) days. Starting at day 15 for maintenance. 4 mL 5    Emollient (CETAPHIL) cream       Gauze Pads & Dressings (KERLIX BANDAGE ROLL 4.5"X9.3') MISC 1 application by Does not apply route daily. (Patient not taking: Reported on 09/09/2021) 100 each 0    hydrocortisone 2.5 % cream Apply twice daily to abdominal area for two weeks. Apply 2nd 60 g 3    ketoconazole (NIZORAL) 2 % cream Apply twice daily to abdominal area for 2 weeks. Apply 1st (Patient not taking: Reported on 09/09/2021) 60 g 2    lamoTRIgine (LAMICTAL) 25 MG tablet Take 25 mg by mouth 2 (two) times daily. 25mg  in the morning and 50mg  in the evening      mupirocin ointment (BACTROBAN) 2 % Apply 1 application. topically daily. Daily with dressing change (Patient not taking: Reported on 09/09/2021) 22 g 2    mupirocin ointment (BACTROBAN) 2 % Discontinue all previous orders for Mupirocin and apply to aa's as directed. 22 g 3    phenytoin (DILANTIN) 50 MG tablet Chew 50 mg by mouth at bedtime. (Patient not taking: Reported on 01/26/2022)      tacrolimus (PROTOPIC) 0.1 % ointment Apply topically 2 (two) times daily. (Patient not taking: Reported on 09/09/2021) 100 g 0    triamcinolone ointment (KENALOG)  0.1 % Apply 1 Application topically 2 (two) times daily. (Patient not taking: Reported on 09/09/2021)      warfarin (COUMADIN) 1 MG tablet Take 1 tablet (1 mg total) by mouth daily. (Patient not taking: Reported on 07/26/2021) 30 tablet 11      Allergies  Allergen Reactions   Dalbavancin Rash    Widespread rash with no systemic symptoms     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   History of lymphocytosis    Impetigo    History of   Liver masses    History of, Benign   Meningioma    Mild mental retardation     Monoclonal gammopathy    low level   Personal history of pulmonary embolism    Seizures    Thyroid mass    history of    Review of systems:  Otherwise negative.    Physical Exam  Gen: Alert, oriented. Appears stated age.  HEENT: PERRLA. Lungs: No respiratory distress CV: RRR Abd: soft, benign, no masses Ext: No edema    Planned procedures: Proceed with colonoscopy. The patient understands the nature of the planned procedure, indications, risks, alternatives and potential complications including but not limited to bleeding, infection, perforation, damage to internal organs and possible oversedation/side effects from anesthesia. The patient agrees and gives consent to proceed.  Please refer to procedure notes for findings, recommendations and patient disposition/instructions.     Lesly Rubenstein, MD [facility] Gastroenterology

## 2022-04-04 ENCOUNTER — Encounter: Payer: Self-pay | Admitting: Gastroenterology

## 2022-04-04 LAB — SURGICAL PATHOLOGY

## 2022-05-09 ENCOUNTER — Encounter: Payer: Self-pay | Admitting: Gastroenterology

## 2022-05-18 ENCOUNTER — Other Ambulatory Visit: Payer: Self-pay

## 2022-05-18 DIAGNOSIS — L2089 Other atopic dermatitis: Secondary | ICD-10-CM

## 2022-05-18 MED ORDER — DUPIXENT 300 MG/2ML ~~LOC~~ SOSY: 300.0000 mg | PREFILLED_SYRINGE | SUBCUTANEOUS | 1 refills | Status: DC

## 2022-05-18 NOTE — Progress Notes (Signed)
Refill request faxed from [facility]. [employer]  

## 2022-06-22 ENCOUNTER — Ambulatory Visit (INDEPENDENT_AMBULATORY_CARE_PROVIDER_SITE_OTHER): Payer: Medicare Other | Admitting: Dermatology

## 2022-06-22 VITALS — BP 129/77

## 2022-06-22 DIAGNOSIS — Z79899 Other long term (current) drug therapy: Secondary | ICD-10-CM

## 2022-06-22 DIAGNOSIS — L819 Disorder of pigmentation, unspecified: Secondary | ICD-10-CM

## 2022-06-22 DIAGNOSIS — L2081 Atopic neurodermatitis: Secondary | ICD-10-CM

## 2022-06-22 DIAGNOSIS — Z7189 Other specified counseling: Secondary | ICD-10-CM

## 2022-06-22 DIAGNOSIS — L2089 Other atopic dermatitis: Secondary | ICD-10-CM | POA: Diagnosis not present

## 2022-06-22 DIAGNOSIS — L98492 Non-pressure chronic ulcer of skin of other sites with fat layer exposed: Secondary | ICD-10-CM

## 2022-06-22 MED ORDER — DUPIXENT 300 MG/2ML ~~LOC~~ SOSY: 300.0000 mg | PREFILLED_SYRINGE | SUBCUTANEOUS | 5 refills | Status: DC

## 2022-06-22 MED ORDER — TRIAMCINOLONE ACETONIDE 0.1 % EX OINT: TOPICAL_OINTMENT | CUTANEOUS | 2 refills | Status: DC

## 2022-06-22 MED ORDER — MUPIROCIN 2 % EX OINT: TOPICAL_OINTMENT | CUTANEOUS | 3 refills | Status: DC

## 2022-06-22 MED ORDER — TACROLIMUS 0.1 % EX OINT: TOPICAL_OINTMENT | CUTANEOUS | 2 refills | Status: DC

## 2022-06-22 NOTE — Patient Instructions (Addendum)
Continue mupirocin to wounds, scabs or sores daily as needed Continue tacrolimus daily for eczema at face, groin, underarms as needed Continue TMC 0.1% ointment twice daily for up to 5 days a week as needed for eczema. Avoid applying to face, groin, and axilla. Use as directed. Long-term use can cause thinning of the skin. Continue Dupixent 300 mg/83mL SQ QOW.  Reviewed risks of biologics including immunosuppression, infections, injection site reaction, and failure to improve condition. Goal is control of skin condition, not cure.  Some older biologics such as Humira and Enbrel may slightly increase risk of malignancy and may worsen congestive heart failure.  Taltz and Cosentyx may cause inflammatory bowel disease to flare. The use of biologics requires long term medication management, including periodic office visits and monitoring of blood work.  Topical steroids (such as triamcinolone, fluocinolone, fluocinonide, mometasone, clobetasol, halobetasol, betamethasone, hydrocortisone) can cause thinning and lightening of the skin if they are used for too long in the same area. Your physician has selected the right strength medicine for your problem and area affected on the body. Please use your medication only as directed by your physician to prevent side effects.   Due to recent changes in healthcare laws, you may see results of your pathology and/or laboratory studies on MyChart before the doctors have had a chance to review them. We understand that in some cases there may be results that are confusing or concerning to you. Please understand that not all results are received at the same time and often the doctors may need to interpret multiple results in order to provide you with the best plan of care or course of treatment. Therefore, we ask that you please give [name] 2 business days to thoroughly review all your results before contacting the office for clarification. Should we see a critical lab result, you  will be contacted sooner.   If You Need Anything After Your Visit  If you have any questions or concerns for your doctor, please call our main line at [phone] and press option 4 to reach your doctor's medical assistant. If no one answers, please leave a voicemail as directed and we will return your call as soon as possible. Messages left after 4 pm will be answered the following business day.   You may also send [name] a message via MyChart. We typically respond to MyChart messages within 1-2 business days.  For prescription refills, please ask your pharmacy to contact our office. Our fax number is [phone].  If you have an urgent issue when the clinic is closed that cannot wait until the next business day, you can page your doctor at the number below.    Please note that while we do our best to be available for urgent issues outside of office hours, we are not available 24/7.   If you have an urgent issue and are unable to reach [name], you may choose to seek medical care at your doctor's office, retail clinic, urgent care center, or emergency room.  If you have a medical emergency, please immediately call 911 or go to the emergency department.  Pager Numbers  - Dr. Gwen Pounds: [phone]  - Dr. Neale Burly: [phone]  - Dr. Roseanne Reno: [phone]  In the event of inclement weather, please call our main line at [phone] for an update on the status of any delays or closures.  Dermatology Medication Tips: Please keep the boxes that topical medications come in in order to help keep track of the instructions about where and  how to use these. Pharmacies typically print the medication instructions only on the boxes and not directly on the medication tubes.   If your medication is too expensive, please contact our office at [phone] option 4 or send [name] a message through MyChart.   We are unable to tell what your co-pay for medications will be in advance as this is different depending  on your insurance coverage. However, we may be able to find a substitute medication at lower cost or fill out paperwork to get insurance to cover a needed medication.   If a prior authorization is required to get your medication covered by your insurance company, please allow [name] 1-2 business days to complete this process.  Drug prices often vary depending on where the prescription is filled and some pharmacies may offer cheaper prices.  The website www.goodrx.com contains coupons for medications through different pharmacies. The prices here do not account for what the cost may be with help from insurance (it may be cheaper with your insurance), but the website can give you the price if you did not use any insurance.  - You can print the associated coupon and take it with your prescription to the pharmacy.  - You may also stop by our office during regular business hours and pick up a GoodRx coupon card.  - If you need your prescription sent electronically to a different pharmacy, notify our office through [facility] or by phone at [phone] option 4.     Si Usted Necesita Algo Despus de Su Visita  Tambin puede enviarnos un mensaje a travs de Clinical cytogeneticist. Por lo general respondemos a los mensajes de MyChart en el transcurso de 1 a 2 das hbiles.  Para renovar recetas, por favor pida a su farmacia que se ponga en contacto con nuestra oficina. Annie Sable de fax es [place] [phone].  Si tiene un asunto urgente cuando la clnica est cerrada y que no puede esperar hasta el siguiente da hbil, puede llamar/localizar a su doctor(a) al nmero que aparece a continuacin.   Por favor, tenga en cuenta que aunque hacemos todo lo posible para estar disponibles para asuntos urgentes fuera del horario de [place], no estamos disponibles las 24 horas del da, los 7 [address]  [address] de la [place].   Si tiene un problema urgente y no puede comunicarse con nosotros, puede optar por buscar atencin mdica  en  el consultorio de su doctor(a), en una clnica privada, en un centro de atencin urgente o en una sala de emergencias.  Si tiene Engineer, drilling, por favor llame inmediatamente al 911 o vaya a la sala de emergencias.  Nmeros de bper  - Dr. Gwen Pounds: [phone]  - Dra. Moye: [phone]  - Dra. Roseanne Reno: [phone]  En caso de inclemencias del [place], por favor llame a Lacy Duverney principal al [phone] para una actualizacin sobre el [place] de cualquier retraso o cierre.  Consejos para la medicacin en dermatologa: Por favor, guarde las cajas en las que vienen los medicamentos de uso tpico para ayudarle a seguir las instrucciones sobre dnde y cmo usarlos. Las farmacias generalmente imprimen las instrucciones del medicamento slo en las cajas y no directamente en los tubos del [place].   Si su medicamento es muy caro, por favor, pngase en contacto con Rolm Gala llamando al [phone] y presione la opcin 4 o envenos un mensaje a travs de Clinical cytogeneticist.   No podemos decirle cul ser su copago por los medicamentos por adelantado ya que esto es diferente  dependiendo de la cobertura de su seguro. Sin embargo, es posible que podamos encontrar un medicamento sustituto a Audiological scientist un formulario para que el seguro cubra el medicamento que se considera necesario.   Si se requiere una autorizacin previa para que su compaa de seguros [place] su medicamento, por favor permtanos de 1 a 2 das hbiles para completar [address]  Los precios de los medicamentos varan con frecuencia dependiendo del Environmental consultant de dnde se surte la receta y alguna farmacias pueden ofrecer precios ms baratos.  El sitio web www.goodrx.com tiene cupones para medicamentos de Health and safety inspector. Los precios aqu no tienen en cuenta lo que podra costar con la ayuda del seguro (puede ser ms barato con su seguro), pero el sitio web puede darle el precio si no utiliz Tourist information centre manager.  -  Puede imprimir el cupn correspondiente y llevarlo con su receta a la farmacia.  - Tambin puede pasar por nuestra oficina durante el horario de atencin regular y Education officer, museum una tarjeta de cupones de GoodRx.  - Si necesita que su receta se enve electrnicamente a una farmacia diferente, informe a nuestra oficina a travs de MyChart de [place] o por telfono llamando al [phone] y presione la opcin 4.

## 2022-06-22 NOTE — Progress Notes (Signed)
   Follow-Up Visit   Subjective  Laurie Rice is a 62 y.o. female who presents for the following: Atopic Dermatitis  Patient currently on Dupixent sq q2wks. Using TMC 0.1 % ointment as needed. They have not needed to use tacrolimus or mupirocin. Controlled.  The patient's caretaker is present with her and contributes to history The following portions of the chart were reviewed this encounter and updated as appropriate: medications, allergies, medical history  Review of Systems:  No other skin or systemic complaints except as noted in HPI or Assessment and Plan.  Objective  Well appearing patient in no apparent distress; mood and affect are within normal limits.  Areas Examined: Legs,   Relevant physical exam findings are noted in the Assessment and Plan.    Assessment & Plan   Dyschromia  Other atopic dermatitis  Related Medications dupilumab (DUPIXENT) 300 MG/2ML prefilled syringe Inject 300 mg into the skin every 14 (fourteen) days. Starting at day 15 for maintenance.  Ulcer, skin, chronic, with fat layer exposed (HCC)  Related Medications mupirocin ointment (BACTROBAN) 2 % Apply daily to any open sores or wounds as needed.  Atopic neurodermatitis  Related Medications tacrolimus (PROTOPIC) 0.1 % ointment Apply to affected areas of eczema at face, groin, underarms as needed.  Medication management  Long-term use of high-risk medication  Counseling and coordination of care   ATOPIC NEURODERMATITIS With dyschromia Much improved on systemic Dupixent injections.  Exam: hyperpigmentation, xerosis, mild scale 20% BSA  Chronic and persistent condition with duration or expected duration over one year. Condition is symptomatic/ bothersome to patient. Not currently at goal.   Atopic dermatitis (eczema) is a chronic, relapsing, pruritic condition that can significantly affect quality of life. It is often associated with allergic rhinitis and/or asthma and can  require treatment with topical medications, phototherapy, or in severe cases biologic injectable medication (Dupixent; Adbry) or Oral JAK inhibitors.  Treatment Plan: Continue Dupixent 300 mg/12mL SQ QOW. Patient denies side effects.   Potential side effects include allergic reaction, herpes infections, injection site reactions and conjunctivitis (inflammation of the eyes).  The use of Dupixent requires long term medication management, including periodic office visits.  Continue mupirocin to wounds, scabs or sores daily as needed Continue tacrolimus daily for eczema at face, groin, underarms as needed Continue TMC 0.1% ointment twice daily for up to 5 days a week as needed for eczema. Avoid applying to face, groin, and axilla. Use as directed. Long-term use can cause thinning of the skin.   Recommend gentle skin care.   Return in about 6 months (around 12/22/2022) for Dermatitis.  Anise Salvo, RMA, am acting as scribe for Armida Sans, MD .   Documentation: I have reviewed the above documentation for accuracy and completeness, and I agree with the above.  Armida Sans, MD

## 2022-06-25 ENCOUNTER — Encounter: Payer: Self-pay | Admitting: Dermatology

## 2022-07-27 ENCOUNTER — Inpatient Hospital Stay: Payer: Medicare Other

## 2022-07-27 ENCOUNTER — Inpatient Hospital Stay: Payer: Medicare Other | Admitting: Oncology

## 2022-07-27 NOTE — Assessment & Plan Note (Deleted)
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 Bone marrow biopsy reveals marked involvement of lymphoma.   S/p 4 rituximab treatments.   Labs are reviewed and discussed with patient. Continue observation.

## 2022-08-03 ENCOUNTER — Other Ambulatory Visit: Payer: Self-pay

## 2022-08-03 ENCOUNTER — Encounter: Payer: Self-pay | Admitting: Oncology

## 2022-08-03 ENCOUNTER — Inpatient Hospital Stay (HOSPITAL_BASED_OUTPATIENT_CLINIC_OR_DEPARTMENT_OTHER): Payer: Medicare Other | Admitting: Oncology

## 2022-08-03 ENCOUNTER — Inpatient Hospital Stay: Payer: Medicare Other | Attending: Oncology

## 2022-08-03 VITALS — BP 122/66 | HR 56 | Temp 95.0°F | Resp 18 | Wt 184.6 lb

## 2022-08-03 DIAGNOSIS — C8588 Other specified types of non-Hodgkin lymphoma, lymph nodes of multiple sites: Secondary | ICD-10-CM | POA: Diagnosis present

## 2022-08-03 DIAGNOSIS — D509 Iron deficiency anemia, unspecified: Secondary | ICD-10-CM | POA: Diagnosis not present

## 2022-08-03 DIAGNOSIS — Z7901 Long term (current) use of anticoagulants: Secondary | ICD-10-CM | POA: Insufficient documentation

## 2022-08-03 DIAGNOSIS — Z86718 Personal history of other venous thrombosis and embolism: Secondary | ICD-10-CM | POA: Insufficient documentation

## 2022-08-03 DIAGNOSIS — C8510 Unspecified B-cell lymphoma, unspecified site: Secondary | ICD-10-CM

## 2022-08-03 DIAGNOSIS — Z79899 Other long term (current) drug therapy: Secondary | ICD-10-CM | POA: Diagnosis not present

## 2022-08-03 DIAGNOSIS — E041 Nontoxic single thyroid nodule: Secondary | ICD-10-CM | POA: Diagnosis not present

## 2022-08-03 DIAGNOSIS — Z86711 Personal history of pulmonary embolism: Secondary | ICD-10-CM | POA: Diagnosis not present

## 2022-08-03 LAB — COMPREHENSIVE METABOLIC PANEL
ALT: 16 U/L (ref 0–44)
AST: 17 U/L (ref 15–41)
Albumin: 3.3 g/dL — ABNORMAL LOW (ref 3.5–5.0)
Alkaline Phosphatase: 71 U/L (ref 38–126)
Anion gap: 9 (ref 5–15)
BUN: 14 mg/dL (ref 8–23)
CO2: 25 mmol/L (ref 22–32)
Calcium: 9.2 mg/dL (ref 8.9–10.3)
Chloride: 104 mmol/L (ref 98–111)
Creatinine, Ser: 0.85 mg/dL (ref 0.44–1.00)
GFR, Estimated: >60 mL/min (ref >60)
Glucose, Bld: 79 mg/dL (ref 70–99)
Potassium: 3.4 mmol/L — ABNORMAL LOW (ref 3.5–5.1)
Sodium: 138 mmol/L (ref 135–145)
Total Bilirubin: 0.3 mg/dL (ref 0.3–1.2)
Total Protein: 8 g/dL (ref 6.5–8.1)

## 2022-08-03 LAB — CBC WITH DIFFERENTIAL/PLATELET
Abs Immature Granulocytes: 0.03 10*3/uL (ref 0.00–0.07)
Basophils Absolute: 0.1 10*3/uL (ref 0.0–0.1)
Basophils Relative: 1 %
Eosinophils Absolute: 0.1 10*3/uL (ref 0.0–0.5)
Eosinophils Relative: 1 %
HCT: 34.2 % — ABNORMAL LOW (ref 36.0–46.0)
Hemoglobin: 10.2 g/dL — ABNORMAL LOW (ref 12.0–15.0)
Immature Granulocytes: 0 %
Lymphocytes Relative: 39 %
Lymphs Abs: 3.7 10*3/uL (ref 0.7–4.0)
MCH: 21.2 pg — ABNORMAL LOW (ref 26.0–34.0)
MCHC: 29.8 g/dL — ABNORMAL LOW (ref 30.0–36.0)
MCV: 71 fL — ABNORMAL LOW (ref 80.0–100.0)
Monocytes Absolute: 0.6 10*3/uL (ref 0.1–1.0)
Monocytes Relative: 7 %
Neutro Abs: 4.9 10*3/uL (ref 1.7–7.7)
Neutrophils Relative %: 52 %
Platelets: 228 10*3/uL (ref 150–400)
RBC: 4.82 MIL/uL (ref 3.87–5.11)
RDW: 15.9 % — ABNORMAL HIGH (ref 11.5–15.5)
WBC: 9.4 10*3/uL (ref 4.0–10.5)
nRBC: 0 % (ref 0.0–0.2)

## 2022-08-03 LAB — FERRITIN: Ferritin: 124 ng/mL (ref 11–307)

## 2022-08-03 LAB — IRON AND TIBC
Iron: 85 ug/dL (ref 28–170)
Saturation Ratios: 37 % — ABNORMAL HIGH (ref 10.4–31.8)
TIBC: 232 ug/dL — ABNORMAL LOW (ref 250–450)
UIBC: 147 ug/dL

## 2022-08-03 NOTE — Progress Notes (Signed)
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 Bone marrow biopsy reveals marked involvement of lymphoma.   S/p 4 rituximab treatments.   Labs are reviewed and discussed with patient. Pending IgM, viscosity. Anemia is slightly worse.  If IgM continues to increase, over 4000, consider repeat bone marrow biopsy and initiate treatments.  Microcytic anemia Due to alpha thalassemia trait, as well as lymphoma bone marrow involvement.    History of DVT (deep vein thrombosis) Patient is on coumadin, dosed by pcp with a goal of INR 2-3  Thyroid nodule thyroid US showed stable size. Likely benign.   Orders Placed This Encounter  Procedures   Ferritin    Standing Status:   Future    Number of Occurrences:   1    Standing Expiration Date:   08/03/2023   Iron and TIBC    Standing Status:   Future    Number of Occurrences:   1    Standing Expiration Date:   08/03/2023   CBC with Differential (Cancer Center Only)    Standing Status:   Future    Standing Expiration Date:   08/03/2023   CMP (Cancer Center only)    Standing Status:   Future    Standing Expiration Date:   08/03/2023   Multiple Myeloma Panel (SPEP&IFE w/QIG)    Standing Status:   Future    Standing Expiration Date:   08/03/2023   Viscosity, serum    Standing Status:   Future    Standing Expiration Date:   08/03/2023   Kappa/lambda light chains    Standing Status:   Future    Standing Expiration Date:   08/03/2023   Follow up in 6 months.  All questions were answered. The patient knows to call the clinic with any problems, questions or concerns.  Rickard Patience, MD, PhD [facility] Hematology Oncology 08/03/2022    HISTORY OF PRESENTING ILLNESS:   Laurie Rice is a  62 y.o.  female with PMH listed below follows up for lymphoma 06/10/2017 CBC showed elevated white count of 38.5, hemoglobin 10.2, MCV 74.7, absolute lymphocyte 30.4, basophil 0.13, Previous lab records reviewed. Leukocytosis onset of chronic, duration is since at least 2014 Anemia is also chronic since at least 2016 No aggravating or elevated factors. Patient is a poor historian.  She has a seizure disorder and meningioma.  She is not medical competent and has legal guardian.  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    Associated symptoms or signs:  Denies weight loss, fever, chills,night sweats.  Recent history of weight loss about 10 pounds over the past 1 year.  Smoking history: Never smoking History of recent oral steroid use or steroid injection: Denies History of recent infection: Denies Autoimmune disease history.  Denies  Reviewed patient's past medical history, was previously seen by Dr. Merlene Pulling with last visit more than 3 years ago.  Today she wants to reestablish care at [facility] site. 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  Phenotype did not suggest a specific histologic type and was not typical for CLL/SLL, mantle cell lymphoma, hairy cell leukemia or follicular cell lymphoma.  BCR ABL testing was negative.  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  Chronic microcytic anemia since 2008.  She had hemoglobin electrophoresis reviewed normal hemoglobin F and A2 Patient has a history of bilateral DVT and pulmonary embolism in 2010.  Has been on Coumadin chronically She developed right lower extremity DVT on 07/09/2014 secondary to subtherapeutic INR  01/30/2020- 02/20/2020 weekly rituximab treatment x 4  INTERVAL HISTORY Laurie Rice is a 62 y.o. female who has above history reviewed by me today presents for  follow up visit for lymphoma. She was accompanied by care giver from group home.  No reports of new complaints. Appetite is good. Weight is stable.   .Review of Systems  Unable to perform ROS: Other (Intellectual disability)  Constitutional:  Positive for unexpected weight change. Negative for fatigue.    MEDICAL HISTORY:  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   History of lymphocytosis    Impetigo    History of   Liver masses    History of, Benign   Meningioma (HCC)    Mild mental retardation    Monoclonal gammopathy    low level   Personal history of pulmonary embolism    Seizures (HCC)    Thyroid mass    history of    SURGICAL HISTORY: Past Surgical History:  Procedure Laterality Date   COLONOSCOPY WITH PROPOFOL N/A 04/03/2022   Procedure: COLONOSCOPY WITH PROPOFOL;  Surgeon: Regis Bill, MD;  Location: [facility] ENDOSCOPY;  Service: Endoscopy;  Laterality: N/A;   NO PAST SURGERIES      SOCIAL HISTORY: Social History   Socioeconomic History   Marital status: Single    Spouse name: Not on file   Number of children: Not on file   Years of education: Not on file   Highest education level: Not on file  Occupational History   Not on file  Tobacco Use   Smoking status: Never   Smokeless tobacco: Never  Substance and Sexual Activity   Alcohol use: No   Drug use: No   Sexual activity: Not on file  Other Topics Concern   Not on file  Social History Narrative   Not on file   Social Determinants of Health   Financial Resource Strain: Not on file  Food Insecurity: Not on file  Transportation Needs: Not on file  Physical Activity: Not on file  Stress: Not on file  Social Connections: Not on file  Intimate Partner Violence: Not on file    FAMILY HISTORY: Family History  Problem Relation Age of Onset   Hypertension Other     Arthritis Other    Breast cancer Neg Hx     ALLERGIES:  is allergic to dalbavancin.  MEDICATIONS:  Current Outpatient Medications  Medication Sig Dispense Refill   amLODipine (NORVASC) 10 MG tablet Take 10 mg by mouth daily.     bisoprolol-hydrochlorothiazide (ZIAC) 2.5-6.25 MG tablet Take 2 tablets by mouth daily.     dupilumab (DUPIXENT) 300 MG/2ML prefilled syringe Inject 300 mg into the skin every 14 (fourteen) days. Starting at day 15 for maintenance. 4 mL 5   Emollient (CETAPHIL) cream      ferrous sulfate 325 (65 FE) MG EC tablet Take 1 tablet (325 mg total) by mouth daily with breakfast. With orange juice 30 tablet 3   folic acid (  FOLVITE) 1 MG tablet TAKE ONE TABLET BY MOUTH EACH DAY.     ketoconazole (NIZORAL) 2 % cream Apply twice daily to abdominal area for 2 weeks. Apply 1st 60 g 2   mupirocin ointment (BACTROBAN) 2 % Apply daily to any open sores or wounds as needed. 22 g 3   PHENObarbital (LUMINAL) 60 MG tablet Take 60 mg by mouth at bedtime.     tacrolimus (PROTOPIC) 0.1 % ointment Apply to affected areas of eczema at face, groin, underarms as needed. 60 g 2   triamcinolone ointment (KENALOG) 0.1 % Apply twice daily to affected areas for eczema as needed. Avoid applying to face, groin, and axilla. Use as directed. Long-term use can cause thinning of the skin. 80 g 2   warfarin (COUMADIN) 1 MG tablet Take 1 tablet (1 mg total) by mouth every Monday, Wednesday, and Friday. Take with (2) 4 mg tablets every Monday, Wednesday, and Friday for a total of 9 mg on those days. 15 tablet 0   warfarin (COUMADIN) 10 MG tablet Take 10 mg by mouth daily.     phenytoin (DILANTIN) 100 MG ER capsule TAKE 2 CAPSULES BY MOUTH AT BEDTIME (SEIZURE DISORDER) (Patient not taking: Reported on 08/03/2022)     phenytoin (DILANTIN) 50 MG tablet Chew 50 mg by mouth at bedtime. (Patient not taking: Reported on 08/03/2022)     warfarin (COUMADIN) 4 MG tablet Take 2 tablets (8 mg total) by mouth daily at 4  PM. Take 2 tablets daily for 8 mg daily. Add (1) 1 mg tablet on MWF for a total of 9 mg on those days. (Patient not taking: Reported on 08/03/2022) 60 tablet 0   No current facility-administered medications for this visit.     PHYSICAL EXAMINATION: ECOG PERFORMANCE STATUS: 1 - Symptomatic but completely ambulatory Vitals:   08/03/22 1040  BP: 122/66  Pulse: (!) 56  Resp: 18  Temp: (!) 95 F (35 C)  SpO2: 100%   Filed Weights   08/03/22 1040  Weight: 184 lb 9.6 oz (83.7 kg)    Physical Exam Constitutional:      General: She is not in acute distress. HENT:     Head: Normocephalic and atraumatic.  Eyes:     General: No scleral icterus. Cardiovascular:     Rate and Rhythm: Normal rate.  Pulmonary:     Effort: Pulmonary effort is normal. No respiratory distress.     Breath sounds: No wheezing.  Abdominal:     General: Bowel sounds are normal. There is no distension.     Palpations: Abdomen is soft.  Musculoskeletal:        General: Swelling present. No deformity. Normal range of motion.     Cervical back: Normal range of motion and neck supple.     Comments: Bilateral lower extremity swelling, right worse than left.  Skin:    General: Skin is warm and dry.     Findings: No erythema or rash.     Comments: hyperpigmentation changes of lower extremities and multiple areas of low back  Neurological:     Mental Status: She is alert. Mental status is at baseline.  Psychiatric:        Mood and Affect: Mood normal.        Latest Ref Rng & Units 08/03/2022   10:15 AM  CMP  Glucose 70 - 99 mg/dL 79   BUN 8 - 23 mg/dL 14   Creatinine 1.61 - 1.00 mg/dL 0.96   Sodium  135 - 145 mmol/L 138   Potassium 3.5 - 5.1 mmol/L 3.4   Chloride 98 - 111 mmol/L 104   CO2 22 - 32 mmol/L 25   Calcium 8.9 - 10.3 mg/dL 9.2   Total Protein 6.5 - 8.1 g/dL 8.0   Total Bilirubin 0.3 - 1.2 mg/dL 0.3   Alkaline Phos 38 - 126 U/L 71   AST 15 - 41 U/L 17   ALT 0 - 44 U/L 16       Latest Ref Rng  & Units 08/03/2022   10:15 AM  CBC  WBC 4.0 - 10.5 K/uL 9.4   Hemoglobin 12.0 - 15.0 g/dL 86.5   Hematocrit 78.4 - 46.0 % 34.2   Platelets 150 - 400 K/uL 228      RADIOGRAPHIC STUDIES: I have personally reviewed the radiological images as listed and agreed with the findings in the report. No results found.  LABORATORY DATA:  I have reviewed the data as listed    Latest Ref Rng & Units 08/03/2022   10:15 AM 01/26/2022   10:32 AM 07/12/2021   10:48 AM  CBC  WBC 4.0 - 10.5 K/uL 9.4  8.3  9.7   Hemoglobin 12.0 - 15.0 g/dL 69.6  29.5  28.4   Hematocrit 36.0 - 46.0 % 34.2  37.2  39.7   Platelets 150 - 400 K/uL 228  159  243     Recent Labs    01/26/22 1032 08/03/22 1015  NA 140 138  K 4.0 3.4*  CL 104 104  CO2 26 25  GLUCOSE 67* 79  BUN 13 14  CREATININE 0.70 0.85  CALCIUM 8.7* 9.2  GFRNONAA >60 >60  PROT 7.6 8.0  ALBUMIN 3.3* 3.3*  AST 18 17  ALT 13 16  ALKPHOS 88 71  BILITOT 0.4 0.3   Iron/TIBC/Ferritin/ %Sat    Component Value Date/Time   IRON 99 02/13/2020 0808   IRON 94 08/22/2012 1142   TIBC 196 (L) 02/13/2020 0808   TIBC 221 (L) 08/22/2012 1142   FERRITIN 112 02/13/2020 0808   FERRITIN 65 08/22/2012 1142   IRONPCTSAT 51 (H) 02/13/2020 0808   IRONPCTSAT 43 08/22/2012 1142

## 2022-08-03 NOTE — Assessment & Plan Note (Signed)
thyroid US showed stable size. Likely benign.

## 2022-08-03 NOTE — Assessment & Plan Note (Signed)
Patient is on coumadin, dosed by pcp with a goal of INR 2-3

## 2022-08-03 NOTE — Assessment & Plan Note (Signed)
Due to alpha thalassemia trait, as well as lymphoma bone marrow involvement.

## 2022-08-03 NOTE — Assessment & Plan Note (Addendum)
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 Bone marrow biopsy reveals marked involvement of lymphoma.   S/p 4 rituximab treatments.   Labs are reviewed and discussed with patient. Pending IgM, viscosity. Anemia is slightly worse.  If IgM continues to increase, over 4000, consider repeat bone marrow biopsy and initiate treatments.

## 2022-11-03 ENCOUNTER — Inpatient Hospital Stay: Payer: Medicare Other | Admitting: Oncology

## 2022-11-03 ENCOUNTER — Inpatient Hospital Stay: Payer: Medicare Other

## 2022-11-03 NOTE — Assessment & Plan Note (Deleted)
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 Bone marrow biopsy reveals marked involvement of lymphoma.   S/p 4 rituximab treatments.   Labs are reviewed and discussed with patient. Pending IgM, viscosity. Anemia is slightly worse.  If IgM continues to increase, over 4000, consider repeat bone marrow biopsy and initiate treatments.

## 2022-11-10 ENCOUNTER — Encounter: Payer: Self-pay | Admitting: Oncology

## 2022-11-10 ENCOUNTER — Inpatient Hospital Stay (HOSPITAL_BASED_OUTPATIENT_CLINIC_OR_DEPARTMENT_OTHER): Payer: Medicare Other | Admitting: Oncology

## 2022-11-10 ENCOUNTER — Inpatient Hospital Stay: Payer: Medicare Other | Attending: Oncology

## 2022-11-10 VITALS — BP 154/71 | HR 56 | Temp 95.4°F | Resp 18 | Wt 185.8 lb

## 2022-11-10 DIAGNOSIS — D563 Thalassemia minor: Secondary | ICD-10-CM | POA: Insufficient documentation

## 2022-11-10 DIAGNOSIS — C8510 Unspecified B-cell lymphoma, unspecified site: Secondary | ICD-10-CM | POA: Diagnosis not present

## 2022-11-10 DIAGNOSIS — Z7901 Long term (current) use of anticoagulants: Secondary | ICD-10-CM | POA: Insufficient documentation

## 2022-11-10 DIAGNOSIS — D509 Iron deficiency anemia, unspecified: Secondary | ICD-10-CM | POA: Insufficient documentation

## 2022-11-10 DIAGNOSIS — Z86718 Personal history of other venous thrombosis and embolism: Secondary | ICD-10-CM | POA: Insufficient documentation

## 2022-11-10 DIAGNOSIS — E041 Nontoxic single thyroid nodule: Secondary | ICD-10-CM | POA: Insufficient documentation

## 2022-11-10 LAB — CBC WITH DIFFERENTIAL (CANCER CENTER ONLY)
Abs Immature Granulocytes: 0.03 10*3/uL (ref 0.00–0.07)
Basophils Absolute: 0.1 10*3/uL (ref 0.0–0.1)
Basophils Relative: 1 %
Eosinophils Absolute: 0.2 10*3/uL (ref 0.0–0.5)
Eosinophils Relative: 2 %
HCT: 34.4 % — ABNORMAL LOW (ref 36.0–46.0)
Hemoglobin: 10.3 g/dL — ABNORMAL LOW (ref 12.0–15.0)
Immature Granulocytes: 0 %
Lymphocytes Relative: 31 %
Lymphs Abs: 3 10*3/uL (ref 0.7–4.0)
MCH: 21.6 pg — ABNORMAL LOW (ref 26.0–34.0)
MCHC: 29.9 g/dL — ABNORMAL LOW (ref 30.0–36.0)
MCV: 72.3 fL — ABNORMAL LOW (ref 80.0–100.0)
Monocytes Absolute: 0.7 10*3/uL (ref 0.1–1.0)
Monocytes Relative: 7 %
Neutro Abs: 5.8 10*3/uL (ref 1.7–7.7)
Neutrophils Relative %: 59 %
Platelet Count: 234 10*3/uL (ref 150–400)
RBC: 4.76 MIL/uL (ref 3.87–5.11)
RDW: 15.9 % — ABNORMAL HIGH (ref 11.5–15.5)
WBC Count: 9.8 10*3/uL (ref 4.0–10.5)
nRBC: 0 % (ref 0.0–0.2)

## 2022-11-10 LAB — CMP (CANCER CENTER ONLY)
ALT: 14 U/L (ref 0–44)
AST: 18 U/L (ref 15–41)
Albumin: 3.5 g/dL (ref 3.5–5.0)
Alkaline Phosphatase: 73 U/L (ref 38–126)
Anion gap: 7 (ref 5–15)
BUN: 15 mg/dL (ref 8–23)
CO2: 26 mmol/L (ref 22–32)
Calcium: 9.2 mg/dL (ref 8.9–10.3)
Chloride: 105 mmol/L (ref 98–111)
Creatinine: 1.03 mg/dL — ABNORMAL HIGH (ref 0.44–1.00)
GFR, Estimated: >60 mL/min (ref >60)
Glucose, Bld: 79 mg/dL (ref 70–99)
Potassium: 3.8 mmol/L (ref 3.5–5.1)
Sodium: 138 mmol/L (ref 135–145)
Total Bilirubin: 0.4 mg/dL (ref <1.2)
Total Protein: 8.3 g/dL — ABNORMAL HIGH (ref 6.5–8.1)

## 2022-11-10 NOTE — Assessment & Plan Note (Signed)
thyroid US showed stable size. Likely benign.

## 2022-11-10 NOTE — Assessment & Plan Note (Signed)
Patient is on coumadin, dosed by pcp with a goal of INR 2-3

## 2022-11-10 NOTE — Progress Notes (Signed)
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 Bone marrow biopsy reveals marked involvement of lymphoma.   S/p 4 rituximab treatments.   Labs are reviewed and discussed with patient. Pending IgM, viscosity. Anemia is slightly worse.  If IgM continues to increase, over 4000, consider repeat bone marrow biopsy and initiate treatments.  Microcytic anemia Due to alpha thalassemia trait, as well as lymphoma bone marrow involvement.    Thyroid nodule thyroid US showed stable size. Likely benign.   History of DVT (deep vein thrombosis) Patient is on coumadin, dosed by pcp with a goal of INR 2-3  Orders Placed This Encounter  Procedures   CMP (Cancer Center only)    Standing Status:   Future    Standing Expiration Date:   11/10/2023   CBC with Differential (Cancer Center Only)    Standing Status:   Future    Standing Expiration Date:   11/10/2023   Multiple Myeloma Panel (SPEP&IFE w/QIG)    Standing Status:   Future    Standing Expiration Date:   11/10/2023   Kappa/lambda light chains    Standing Status:   Future    Standing Expiration Date:   11/10/2023   Viscosity, serum    Standing Status:   Future    Standing Expiration Date:   11/10/2023   Follow up in 6 months.  All questions were answered. The patient knows to call the clinic with any problems, questions or concerns.  Rickard Patience, MD, PhD [facility] Hematology Oncology 11/10/2022    HISTORY OF PRESENTING ILLNESS:  Laurie Rice is a  62 y.o.  female with PMH listed below follows up for lymphoma 06/10/2017 CBC showed elevated white count of 38.5, hemoglobin 10.2, MCV 74.7, absolute lymphocyte 30.4, basophil 0.13, Previous lab records  reviewed. Leukocytosis onset of chronic, duration is since at least 2014 Anemia is also chronic since at least 2016 No aggravating or elevated factors. Patient is a poor historian.  She has a seizure disorder and meningioma.  She is not medical competent and has legal guardian.  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    Associated symptoms or signs:  Denies weight loss, fever, chills,night sweats.  Recent history of weight loss about 10 pounds over the past 1 year.  Smoking history: Never smoking History of recent oral steroid use or steroid injection: Denies History of recent infection: Denies Autoimmune disease history.  Denies  Reviewed patient's past medical history, was previously seen by Dr. Merlene Pulling with last visit more than 3 years ago.  Today she wants to reestablish care at [facility] site. 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  Phenotype did not suggest a specific histologic type and was not typical for CLL/SLL, mantle cell lymphoma, hairy cell leukemia or follicular cell lymphoma.  BCR ABL testing was negative.  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  Chronic microcytic anemia since 2008.  She had hemoglobin electrophoresis reviewed normal hemoglobin F and A2 Patient has a history of bilateral DVT and pulmonary embolism in 2010.  Has been on Coumadin chronically She developed right lower extremity DVT on 07/09/2014 secondary to subtherapeutic INR  01/30/2020- 02/20/2020 weekly  rituximab treatment x 4  INTERVAL HISTORY Laurie Rice is a 62 y.o. female who has above history reviewed by me today presents for follow up visit for lymphoma. She was accompanied by care giver from group home.  No reports of new complaints. Appetite is good. Weight is stable.   .Review of Systems  Unable to perform ROS: Other (Intellectual disability)   Constitutional:  Positive for unexpected weight change. Negative for fatigue.    MEDICAL HISTORY:  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   History of lymphocytosis    Impetigo    History of   Liver masses    History of, Benign   Meningioma (HCC)    Mild mental retardation    Monoclonal gammopathy    low level   Personal history of pulmonary embolism    Seizures (HCC)    Thyroid mass    history of    SURGICAL HISTORY: Past Surgical History:  Procedure Laterality Date   COLONOSCOPY WITH PROPOFOL N/A 04/03/2022   Procedure: COLONOSCOPY WITH PROPOFOL;  Surgeon: Regis Bill, MD;  Location: [facility] ENDOSCOPY;  Service: Endoscopy;  Laterality: N/A;   NO PAST SURGERIES      SOCIAL HISTORY: Social History   Socioeconomic History   Marital status: Single    Spouse name: Not on file   Number of children: Not on file   Years of education: Not on file   Highest education level: Not on file  Occupational History   Not on file  Tobacco Use   Smoking status: Never   Smokeless tobacco: Never  Substance and Sexual Activity   Alcohol use: No   Drug use: No   Sexual activity: Not on file  Other Topics Concern   Not on file  Social History Narrative   Not on file   Social Determinants of Health   Financial Resource Strain: Low Risk  (08/04/2022)   Received from [facility]   Overall Financial Resource Strain (CARDIA)    Difficulty of Paying Living Expenses: Not hard at all  Food Insecurity: No Food Insecurity (08/04/2022)   Received from [facility]   Hunger Vital Sign    Worried About Running Out of Food in the Last Year: Never true    Ran Out of Food in the Last Year: Never true  Transportation Needs: No Transportation Needs (08/04/2022)   Received from [facility] -  Transportation    In the past 12 months, has lack of transportation kept you from medical appointments or from getting medications?: No    Lack of Transportation (Non-Medical): No  Physical Activity: Not on file  Stress: Not on file  Social Connections: Not on file  Intimate Partner Violence: Not on file    FAMILY HISTORY: Family History  Problem Relation Age of Onset   Hypertension Other    Arthritis Other    Breast cancer Neg Hx     ALLERGIES:  is allergic to dalbavancin.  MEDICATIONS:  Current Outpatient Medications  Medication Sig Dispense Refill   amLODipine (NORVASC) 10 MG tablet Take 10 mg by mouth daily.     bisoprolol-hydrochlorothiazide (ZIAC) 2.5-6.25 MG tablet Take 2 tablets by mouth daily.     dupilumab (DUPIXENT) 300 MG/2ML prefilled syringe Inject 300 mg into  the skin every 14 (fourteen) days. Starting at day 15 for maintenance. 4 mL 5   Emollient (CETAPHIL) cream      ferrous sulfate 325 (65 FE) MG EC tablet Take 1 tablet (325 mg total) by mouth daily with breakfast. With orange juice 30 tablet 3   folic acid (FOLVITE) 1 MG tablet TAKE ONE TABLET BY MOUTH EACH DAY.     ketoconazole (NIZORAL) 2 % cream Apply twice daily to abdominal area for 2 weeks. Apply 1st 60 g 2   lamoTRIgine (LAMICTAL) 100 MG tablet Take 100 mg by mouth 2 (two) times daily.     mupirocin ointment (BACTROBAN) 2 % Apply daily to any open sores or wounds as needed. 22 g 3   PHENObarbital (LUMINAL) 60 MG tablet Take 60 mg by mouth at bedtime.     triamcinolone ointment (KENALOG) 0.1 % Apply twice daily to affected areas for eczema as needed. Avoid applying to face, groin, and axilla. Use as directed. Long-term use can cause thinning of the skin. 80 g 2   warfarin (COUMADIN) 1 MG tablet Take 1 tablet (1 mg total) by mouth every Monday, Wednesday, and Friday. Take with (2) 4 mg tablets every Monday, Wednesday, and Friday for a total of 9 mg on those days. 15 tablet 0   warfarin (COUMADIN) 10 MG tablet  Take 10 mg by mouth daily.     phenytoin (DILANTIN) 100 MG ER capsule TAKE 2 CAPSULES BY MOUTH AT BEDTIME (SEIZURE DISORDER) (Patient not taking: Reported on 08/03/2022)     phenytoin (DILANTIN) 50 MG tablet Chew 50 mg by mouth at bedtime. (Patient not taking: Reported on 08/03/2022)     tacrolimus (PROTOPIC) 0.1 % ointment Apply to affected areas of eczema at face, groin, underarms as needed. (Patient not taking: Reported on 11/10/2022) 60 g 2   warfarin (COUMADIN) 4 MG tablet Take 2 tablets (8 mg total) by mouth daily at 4 PM. Take 2 tablets daily for 8 mg daily. Add (1) 1 mg tablet on MWF for a total of 9 mg on those days. (Patient not taking: Reported on 08/03/2022) 60 tablet 0   No current facility-administered medications for this visit.     PHYSICAL EXAMINATION: ECOG PERFORMANCE STATUS: 1 - Symptomatic but completely ambulatory Vitals:   11/10/22 1139  BP: (!) 154/71  Pulse: (!) 56  Resp: 18  Temp: (!) 95.4 F (35.2 C)  SpO2: 100%   Filed Weights   11/10/22 1139  Weight: 185 lb 12.8 oz (84.3 kg)    Physical Exam Constitutional:      General: She is not in acute distress. HENT:     Head: Normocephalic and atraumatic.  Eyes:     General: No scleral icterus. Cardiovascular:     Rate and Rhythm: Normal rate.  Pulmonary:     Effort: Pulmonary effort is normal. No respiratory distress.     Breath sounds: No wheezing.  Abdominal:     General: Bowel sounds are normal. There is no distension.     Palpations: Abdomen is soft.  Musculoskeletal:        General: Swelling present. No deformity. Normal range of motion.     Cervical back: Normal range of motion and neck supple.     Comments: Bilateral lower extremity swelling, right worse than left.  Skin:    General: Skin is warm and dry.     Findings: No erythema or rash.     Comments: hyperpigmentation changes of lower extremities and multiple  areas of low back  Neurological:     Mental Status: She is alert. Mental status is at  baseline.  Psychiatric:        Mood and Affect: Mood normal.        Latest Ref Rng & Units 11/10/2022   11:19 AM  CMP  Glucose 70 - 99 mg/dL 79   BUN 8 - 23 mg/dL 15   Creatinine 4.09 - 1.00 mg/dL 8.11   Sodium 914 - 782 mmol/L 138   Potassium 3.5 - 5.1 mmol/L 3.8   Chloride 98 - 111 mmol/L 105   CO2 22 - 32 mmol/L 26   Calcium 8.9 - 10.3 mg/dL 9.2   Total Protein 6.5 - 8.1 g/dL 8.3   Total Bilirubin <9.5 mg/dL 0.4   Alkaline Phos 38 - 126 U/L 73   AST 15 - 41 U/L 18   ALT 0 - 44 U/L 14       Latest Ref Rng & Units 11/10/2022   11:19 AM  CBC  WBC 4.0 - 10.5 K/uL 9.8   Hemoglobin 12.0 - 15.0 g/dL 62.1   Hematocrit 30.8 - 46.0 % 34.4   Platelets 150 - 400 K/uL 234      RADIOGRAPHIC STUDIES: I have personally reviewed the radiological images as listed and agreed with the findings in the report. No results found.  LABORATORY DATA:  I have reviewed the data as listed    Latest Ref Rng & Units 11/10/2022   11:19 AM 08/03/2022   10:15 AM 01/26/2022   10:32 AM  CBC  WBC 4.0 - 10.5 K/uL 9.8  9.4  8.3   Hemoglobin 12.0 - 15.0 g/dL 65.7  84.6  96.2   Hematocrit 36.0 - 46.0 % 34.4  34.2  37.2   Platelets 150 - 400 K/uL 234  228  159     Recent Labs    01/26/22 1032 08/03/22 1015 11/10/22 1119  NA 140 138 138  K 4.0 3.4* 3.8  CL 104 104 105  CO2 26 25 26   GLUCOSE 67* 79 79  BUN 13 14 15   CREATININE 0.70 0.85 1.03*  CALCIUM 8.7* 9.2 9.2  GFRNONAA >60 >60 >60  PROT 7.6 8.0 8.3*  ALBUMIN 3.3* 3.3* 3.5  AST 18 17 18   ALT 13 16 14   ALKPHOS 88 71 73  BILITOT 0.4 0.3 0.4   Iron/TIBC/Ferritin/ %Sat    Component Value Date/Time   IRON 85 08/03/2022 1015   IRON 94 08/22/2012 1142   TIBC 232 (L) 08/03/2022 1015   TIBC 221 (L) 08/22/2012 1142   FERRITIN 124 08/03/2022 1015   FERRITIN 65 08/22/2012 1142   IRONPCTSAT 37 (H) 08/03/2022 1015   IRONPCTSAT 43 08/22/2012 1142

## 2022-11-10 NOTE — Assessment & Plan Note (Addendum)
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 Bone marrow biopsy reveals marked involvement of lymphoma.   S/p 4 rituximab treatments.   Labs are reviewed and discussed with patient. Pending IgM, viscosity. Anemia is slightly worse.  If IgM continues to increase, over 4000, consider repeat bone marrow biopsy and initiate treatments.

## 2022-11-10 NOTE — Assessment & Plan Note (Signed)
Due to alpha thalassemia trait, as well as lymphoma bone marrow involvement.

## 2022-11-13 LAB — KAPPA/LAMBDA LIGHT CHAINS
Kappa free light chain: 5.4 mg/L (ref 3.3–19.4)
Kappa, lambda light chain ratio: 0.08 — ABNORMAL LOW (ref 0.26–1.65)
Lambda free light chains: 64.1 mg/L — ABNORMAL HIGH (ref 5.7–26.3)

## 2022-11-14 LAB — VISCOSITY, SERUM: Viscosity, Serum: 2.5 rel.saline — ABNORMAL HIGH (ref 1.4–2.1)

## 2022-11-15 LAB — MULTIPLE MYELOMA PANEL, SERUM
Albumin SerPl Elph-Mcnc: 3.9 g/dL (ref 2.9–4.4)
Albumin/Glob SerPl: 1 (ref 0.7–1.7)
Alpha 1: 0.2 g/dL (ref 0.0–0.4)
Alpha2 Glob SerPl Elph-Mcnc: 0.7 g/dL (ref 0.4–1.0)
B-Globulin SerPl Elph-Mcnc: 0.6 g/dL — ABNORMAL LOW (ref 0.7–1.3)
Gamma Glob SerPl Elph-Mcnc: 2.5 g/dL — ABNORMAL HIGH (ref 0.4–1.8)
Globulin, Total: 4.1 g/dL — ABNORMAL HIGH (ref 2.2–3.9)
IgA: 46 mg/dL — ABNORMAL LOW (ref 87–352)
IgG (Immunoglobin G), Serum: 557 mg/dL — ABNORMAL LOW (ref 586–1602)
IgM (Immunoglobulin M), Srm: 3533 mg/dL — ABNORMAL HIGH (ref 26–217)
M Protein SerPl Elph-Mcnc: 2 g/dL — ABNORMAL HIGH
Total Protein ELP: 8 g/dL (ref 6.0–8.5)

## 2022-12-20 ENCOUNTER — Ambulatory Visit: Payer: Medicare Other | Admitting: Dermatology

## 2023-01-04 ENCOUNTER — Other Ambulatory Visit: Payer: Self-pay | Admitting: Dermatology

## 2023-01-04 DIAGNOSIS — L2081 Atopic neurodermatitis: Secondary | ICD-10-CM

## 2023-01-10 ENCOUNTER — Other Ambulatory Visit: Payer: Self-pay

## 2023-01-10 DIAGNOSIS — L2089 Other atopic dermatitis: Secondary | ICD-10-CM

## 2023-01-10 MED ORDER — DUPIXENT 300 MG/2ML ~~LOC~~ SOSY: 300.0000 mg | PREFILLED_SYRINGE | SUBCUTANEOUS | 5 refills | Status: DC

## 2023-01-10 NOTE — Progress Notes (Signed)
 Received refill request from [facility].

## 2023-01-24 ENCOUNTER — Telehealth: Payer: Self-pay

## 2023-01-24 NOTE — Telephone Encounter (Signed)
Patient group home called for refill of Dupixent, patient  need updated office notes for senderra- return to the office for follow up

## 2023-01-30 ENCOUNTER — Ambulatory Visit (INDEPENDENT_AMBULATORY_CARE_PROVIDER_SITE_OTHER): Payer: Medicare Other | Admitting: Vascular Surgery

## 2023-02-28 ENCOUNTER — Ambulatory Visit (INDEPENDENT_AMBULATORY_CARE_PROVIDER_SITE_OTHER): Payer: Medicare Other | Admitting: Dermatology

## 2023-02-28 ENCOUNTER — Encounter: Payer: Self-pay | Admitting: Dermatology

## 2023-02-28 DIAGNOSIS — Z7189 Other specified counseling: Secondary | ICD-10-CM

## 2023-02-28 DIAGNOSIS — L209 Atopic dermatitis, unspecified: Secondary | ICD-10-CM | POA: Diagnosis not present

## 2023-02-28 DIAGNOSIS — L2089 Other atopic dermatitis: Secondary | ICD-10-CM

## 2023-02-28 DIAGNOSIS — Z79899 Other long term (current) drug therapy: Secondary | ICD-10-CM

## 2023-02-28 DIAGNOSIS — L2081 Atopic neurodermatitis: Secondary | ICD-10-CM | POA: Diagnosis not present

## 2023-02-28 MED ORDER — DUPIXENT 300 MG/2ML ~~LOC~~ SOSY: 300.0000 mg | PREFILLED_SYRINGE | SUBCUTANEOUS | 6 refills | Status: DC

## 2023-02-28 MED ORDER — TRIAMCINOLONE ACETONIDE 0.1 % EX OINT: TOPICAL_OINTMENT | CUTANEOUS | 9 refills | Status: AC

## 2023-02-28 NOTE — Patient Instructions (Signed)

## 2023-02-28 NOTE — Progress Notes (Signed)
   Follow-Up Visit   Subjective  Laurie Rice is a 63 y.o. female who presents for the following: 8 months f/u Atopic Dermatitis on her body, taking Dupixent injection every 2 weeks and applying Triamcinolone cream prn, patient here to refill her Dupixent injection prescription, no concerns with her skin today.   Care giver is with patient and contributes to history.   The following portions of the chart were reviewed this encounter and updated as appropriate: medications, allergies, medical history  Review of Systems:  No other skin or systemic complaints except as noted in HPI or Assessment and Plan.  Objective  Well appearing patient in no apparent distress; mood and affect are within normal limits.     Areas Examined:face, thighs, arms    Relevant physical exam findings are noted in the Assessment and Plan.    Assessment & Plan   ATOPIC NEURODERMATITIS   Related Medications triamcinolone ointment (KENALOG) 0.1 % APPLY TO AFFECTED AREAS ECZEMA ONCE DAILY UP TO 5 DAYS A WEEK AS NEEDED FOR FLARES **EXTERNAL USE ONLY** AVOID FACE, GROIN, AND AXILLA OTHER ATOPIC DERMATITIS   Related Medications dupilumab (DUPIXENT) 300 MG/2ML prefilled syringe Inject 300 mg into the skin every 14 (fourteen) days. Starting at day 15 for maintenance. LONG-TERM USE OF HIGH-RISK MEDICATION   COUNSELING AND COORDINATION OF CARE     ATOPIC DERMATITIS Much improved on systemic Dupixent injections.  Exam: hyperpigmented patches, xerosis, mild scale on her arms and legs  Chronic condition with duration or expected duration over one year. Currently well-controlled.   Atopic dermatitis (eczema) is a chronic, relapsing, pruritic condition that can significantly affect quality of life. It is often associated with allergic rhinitis and/or asthma and can require treatment with topical medications, phototherapy, or in severe cases biologic injectable medication (Dupixent; Adbry) or Oral  JAK inhibitors.  Treatment Plan: Continue Dupixent 300 mg/76mL SQ QOW. Patient denies side effects. Continue Triamcinolone ointment qd-bid prn on her body  Potential side effects include allergic reaction, herpes infections, injection site reactions and conjunctivitis (inflammation of the eyes).  The use of Dupixent requires long term medication management, including periodic office visits.  Dupilumab (Dupixent) is a treatment given by injection for adults and children with moderate-to-severe atopic dermatitis. Goal is control of skin condition, not cure. It is given as 2 injections at the first dose followed by 1 injection ever 2 weeks thereafter.  Young children are dosed monthly.  Topical steroids (such as triamcinolone, fluocinolone, fluocinonide, mometasone, clobetasol, halobetasol, betamethasone, hydrocortisone) can cause thinning and lightening of the skin if they are used for too long in the same area. Your physician has selected the right strength medicine for your problem and area affected on the body. Please use your medication only as directed by your physician to prevent side effects.    Recommend gentle skin care.  Long term medication management.  Patient is using long term (months to years) prescription medication  to control their dermatologic condition.  These medications require periodic monitoring to evaluate for efficacy and side effects and may require periodic laboratory monitoring.   Return in about 1 year (around 02/28/2024) for Atopic dermatitis .  IAngelique Holm, CMA, am acting as scribe for Elie Goody, MD .   Documentation: I have reviewed the above documentation for accuracy and completeness, and I agree with the above.  Elie Goody, MD

## 2023-03-19 ENCOUNTER — Inpatient Hospital Stay (HOSPITAL_BASED_OUTPATIENT_CLINIC_OR_DEPARTMENT_OTHER): Payer: Medicare Other | Admitting: Oncology

## 2023-03-19 ENCOUNTER — Encounter: Payer: Self-pay | Admitting: Oncology

## 2023-03-19 ENCOUNTER — Inpatient Hospital Stay: Payer: Medicare Other | Attending: Oncology

## 2023-03-19 VITALS — BP 142/66 | HR 63 | Temp 97.0°F | Resp 18 | Wt 201.5 lb

## 2023-03-19 DIAGNOSIS — Z86718 Personal history of other venous thrombosis and embolism: Secondary | ICD-10-CM

## 2023-03-19 DIAGNOSIS — C8510 Unspecified B-cell lymphoma, unspecified site: Secondary | ICD-10-CM

## 2023-03-19 DIAGNOSIS — D509 Iron deficiency anemia, unspecified: Secondary | ICD-10-CM | POA: Diagnosis not present

## 2023-03-19 LAB — CBC WITH DIFFERENTIAL (CANCER CENTER ONLY)
Abs Immature Granulocytes: 0.06 10*3/uL (ref 0.00–0.07)
Basophils Absolute: 0.1 10*3/uL (ref 0.0–0.1)
Basophils Relative: 1 %
Eosinophils Absolute: 0.1 10*3/uL (ref 0.0–0.5)
Eosinophils Relative: 1 %
HCT: 36.5 % (ref 36.0–46.0)
Hemoglobin: 10.7 g/dL — ABNORMAL LOW (ref 12.0–15.0)
Immature Granulocytes: 1 %
Lymphocytes Relative: 29 %
Lymphs Abs: 2.5 10*3/uL (ref 0.7–4.0)
MCH: 21.3 pg — ABNORMAL LOW (ref 26.0–34.0)
MCHC: 29.3 g/dL — ABNORMAL LOW (ref 30.0–36.0)
MCV: 72.7 fL — ABNORMAL LOW (ref 80.0–100.0)
Monocytes Absolute: 0.6 10*3/uL (ref 0.1–1.0)
Monocytes Relative: 7 %
Neutro Abs: 5.4 10*3/uL (ref 1.7–7.7)
Neutrophils Relative %: 61 %
Platelet Count: 207 10*3/uL (ref 150–400)
RBC: 5.02 MIL/uL (ref 3.87–5.11)
RDW: 16 % — ABNORMAL HIGH (ref 11.5–15.5)
WBC Count: 8.7 10*3/uL (ref 4.0–10.5)
nRBC: 0 % (ref 0.0–0.2)

## 2023-03-19 LAB — CMP (CANCER CENTER ONLY)
ALT: 14 U/L (ref 0–44)
AST: 22 U/L (ref 15–41)
Albumin: 3.5 g/dL (ref 3.5–5.0)
Alkaline Phosphatase: 64 U/L (ref 38–126)
Anion gap: 11 (ref 5–15)
BUN: 15 mg/dL (ref 8–23)
CO2: 24 mmol/L (ref 22–32)
Calcium: 9 mg/dL (ref 8.9–10.3)
Chloride: 99 mmol/L (ref 98–111)
Creatinine: 1 mg/dL (ref 0.44–1.00)
GFR, Estimated: >60 mL/min (ref >60)
Glucose, Bld: 77 mg/dL (ref 70–99)
Potassium: 3.9 mmol/L (ref 3.5–5.1)
Sodium: 134 mmol/L — ABNORMAL LOW (ref 135–145)
Total Bilirubin: 0.3 mg/dL (ref 0.0–1.2)
Total Protein: 8.7 g/dL — ABNORMAL HIGH (ref 6.5–8.1)

## 2023-03-19 NOTE — Assessment & Plan Note (Signed)
Patient is on coumadin, dosed by pcp with a goal of INR 2-3

## 2023-03-19 NOTE — Assessment & Plan Note (Signed)
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 Bone marrow biopsy reveals marked involvement of lymphoma.   S/p 4 rituximab treatments.   Labs are reviewed and discussed with patient. Pending IgM, viscosity. Anemia is slightly worse.  If IgM continues to increase, over 4000, consider repeat bone marrow biopsy and initiate treatments.

## 2023-03-19 NOTE — Assessment & Plan Note (Signed)
Due to alpha thalassemia trait, as well as lymphoma bone marrow involvement.

## 2023-03-19 NOTE — Progress Notes (Signed)
 Hematology/Oncology Progress note [phone] [phone]    CHIEF COMPLAINTS/REASON FOR VISIT:  Follow-up for lymphoma  ASSESSMENT & PLAN:   History of DVT (deep vein thrombosis) Patient is on coumadin, dosed by pcp with a goal of INR 2-3  Microcytic anemia Due to alpha thalassemia trait, as well as lymphoma bone marrow involvement.    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 Bone marrow biopsy reveals marked involvement of lymphoma.   S/p 4 rituximab treatments.   Labs are reviewed and discussed with patient. Pending IgM, viscosity. Anemia is slightly worse.  If IgM continues to increase, over 4000, consider repeat bone marrow biopsy and initiate treatments.  Orders Placed This Encounter  Procedures   CBC with Differential (Cancer Center Only)    Standing Status:   Future    Expected Date:   07/19/2023    Expiration Date:   03/18/2024   CMP (Cancer Center only)    Standing Status:   Future    Expected Date:   07/19/2023    Expiration Date:   03/18/2024   Kappa/lambda light chains    Standing Status:   Future    Expected Date:   07/19/2023    Expiration Date:   03/18/2024   Multiple Myeloma Panel (SPEP&IFE w/QIG)    Standing Status:   Future    Expected Date:   07/19/2023    Expiration Date:   03/18/2024   Viscosity, serum    Standing Status:   Future    Expected Date:   07/19/2023    Expiration Date:   03/18/2024   Follow up in 4 months.  All questions were answered. The patient knows to call the clinic with any problems, questions or concerns.  Rickard Patience, MD, PhD [facility] Hematology Oncology 03/19/2023    HISTORY OF PRESENTING ILLNESS:  Laurie Rice is a  63 y.o.  female with PMH listed below follows up for lymphoma 06/10/2017 CBC showed elevated white count of 38.5, hemoglobin 10.2, MCV 74.7, absolute lymphocyte  30.4, basophil 0.13, Previous lab records reviewed. Leukocytosis onset of chronic, duration is since at least 2014 Anemia is also chronic since at least 2016 No aggravating or elevated factors. Patient is a poor historian.  She has a seizure disorder and meningioma.  She is not medical competent and has legal guardian.  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    Associated symptoms or signs:  Denies weight loss, fever, chills,night sweats.  Recent history of weight loss about 10 pounds over the past 1 year.  Smoking history: Never smoking History of recent oral steroid use or steroid injection: Denies History of recent infection: Denies Autoimmune disease history.  Denies  Reviewed patient's past medical history, was previously seen by Dr. Merlene Pulling with last visit more than 3 years ago.  Today she wants to reestablish care at [facility] site. 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  Phenotype did not suggest a specific histologic type and was not typical for CLL/SLL, mantle cell lymphoma, hairy cell leukemia or follicular cell lymphoma.  BCR ABL testing was negative.  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  Chronic microcytic anemia since 2008.  She had hemoglobin electrophoresis reviewed normal hemoglobin F and A2 Patient has a history of bilateral DVT and pulmonary embolism  in 2010.  Has been on Coumadin chronically She developed right lower extremity DVT on 07/09/2014 secondary to subtherapeutic INR  01/30/2020- 02/20/2020 weekly rituximab treatment x 4  INTERVAL HISTORY Laurie Rice is a 63 y.o. female who has above history reviewed by me today presents for follow up visit for lymphoma. She was accompanied by care giver from group home.  No reports of new complaints. Appetite is good. She has gained weight    .Review of Systems  Unable to  perform ROS: Other (Intellectual disability)  Constitutional:  Positive for unexpected weight change. Negative for fatigue.    MEDICAL HISTORY:  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   History of lymphocytosis    Impetigo    History of   Liver masses    History of, Benign   Meningioma (HCC)    Mild mental retardation    Monoclonal gammopathy    low level   Personal history of pulmonary embolism    Seizures (HCC)    Thyroid mass    history of    SURGICAL HISTORY: Past Surgical History:  Procedure Laterality Date   COLONOSCOPY WITH PROPOFOL N/A 04/03/2022   Procedure: COLONOSCOPY WITH PROPOFOL;  Surgeon: Regis Bill, MD;  Location: [facility] ENDOSCOPY;  Service: Endoscopy;  Laterality: N/A;   NO PAST SURGERIES      SOCIAL HISTORY: Social History   Socioeconomic History   Marital status: Single    Spouse name: Not on file   Number of children: Not on file   Years of education: Not on file   Highest education level: Not on file  Occupational History   Not on file  Tobacco Use   Smoking status: Never   Smokeless tobacco: Never  Substance and Sexual Activity   Alcohol use: No   Drug use: No   Sexual activity: Not on file  Other Topics Concern   Not on file  Social History Narrative   Not on file   Social Drivers of Health   Financial Resource Strain: Low Risk  (08/04/2022)   Received from [facility]   Overall Financial Resource Strain (CARDIA)    Difficulty of Paying Living Expenses: Not hard at all  Food Insecurity: No Food Insecurity (08/04/2022)   Received from [facility]   Hunger Vital Sign    Worried About Running Out of Food in the Last Year: Never true    Ran Out of Food in the Last Year: Never true  Transportation Needs: No Transportation Needs (08/04/2022)   Received from [facility] - Transportation    In the past 12 months, has lack of transportation kept you from medical appointments or from getting medications?: No    Lack of Transportation (Non-Medical): No  Physical Activity: Not on file  Stress: Not on file  Social Connections: Not on file  Intimate Partner Violence: Not on file    FAMILY HISTORY: Family History  Problem Relation Age of Onset   Hypertension Other    Arthritis Other    Breast cancer Neg Hx     ALLERGIES:  is allergic to dalbavancin.  MEDICATIONS:  Current Outpatient Medications  Medication Sig Dispense Refill   amLODipine (NORVASC) 10 MG tablet Take 10 mg by mouth daily.  bisoprolol-hydrochlorothiazide (ZIAC) 2.5-6.25 MG tablet Take 2 tablets by mouth daily.     dupilumab (DUPIXENT) 300 MG/2ML prefilled syringe Inject 300 mg into the skin every 14 (fourteen) days. Starting at day 15 for maintenance. 4 mL 6   Emollient (CETAPHIL) cream      ferrous sulfate 325 (65 FE) MG EC tablet Take 1 tablet (325 mg total) by mouth daily with breakfast. With orange juice 30 tablet 3   folic acid (FOLVITE) 1 MG tablet TAKE ONE TABLET BY MOUTH EACH DAY.     ketoconazole (NIZORAL) 2 % cream Apply twice daily to abdominal area for 2 weeks. Apply 1st 60 g 2   lamoTRIgine (LAMICTAL) 100 MG tablet Take 100 mg by mouth 2 (two) times daily.     mupirocin ointment (BACTROBAN) 2 % Apply daily to any open sores or wounds as needed. 22 g 3   PHENObarbital (LUMINAL) 60 MG tablet Take 60 mg by mouth at bedtime.     triamcinolone ointment (KENALOG) 0.1 % APPLY TO AFFECTED AREAS ECZEMA ONCE DAILY UP TO 5 DAYS A WEEK AS NEEDED FOR FLARES **EXTERNAL USE ONLY** AVOID FACE, GROIN, AND AXILLA 80 g 9   warfarin (COUMADIN) 1 MG tablet Take 1 tablet (1 mg total) by mouth every Monday, Wednesday, and Friday. Take with (2) 4 mg tablets every Monday, Wednesday, and Friday for a total of 9 mg on those days. 15 tablet 0   warfarin (COUMADIN) 10  MG tablet Take 10 mg by mouth daily.     phenytoin (DILANTIN) 100 MG ER capsule TAKE 2 CAPSULES BY MOUTH AT BEDTIME (SEIZURE DISORDER) (Patient not taking: Reported on 08/03/2022)     phenytoin (DILANTIN) 50 MG tablet Chew 50 mg by mouth at bedtime. (Patient not taking: Reported on 08/03/2022)     warfarin (COUMADIN) 4 MG tablet Take 2 tablets (8 mg total) by mouth daily at 4 PM. Take 2 tablets daily for 8 mg daily. Add (1) 1 mg tablet on MWF for a total of 9 mg on those days. (Patient not taking: Reported on 03/19/2023) 60 tablet 0   No current facility-administered medications for this visit.     PHYSICAL EXAMINATION: ECOG PERFORMANCE STATUS: 1 - Symptomatic but completely ambulatory Vitals:   03/19/23 1042  BP: (!) 142/66  Pulse: 63  Resp: 18  Temp: (!) 97 F (36.1 C)  SpO2: 97%   Filed Weights   03/19/23 1042  Weight: 201 lb 8 oz (91.4 kg)    Physical Exam Constitutional:      General: She is not in acute distress. HENT:     Head: Normocephalic and atraumatic.  Eyes:     General: No scleral icterus. Cardiovascular:     Rate and Rhythm: Normal rate.  Pulmonary:     Effort: Pulmonary effort is normal. No respiratory distress.     Breath sounds: No wheezing.  Abdominal:     General: Bowel sounds are normal. There is no distension.     Palpations: Abdomen is soft.  Musculoskeletal:        General: Swelling present. No deformity. Normal range of motion.     Cervical back: Normal range of motion and neck supple.     Comments: Bilateral lower extremity swelling, right worse than left.  Skin:    General: Skin is warm and dry.     Findings: No erythema or rash.     Comments: hyperpigmentation changes of lower extremities and multiple areas of low back  Neurological:  Mental Status: She is alert. Mental status is at baseline.  Psychiatric:        Mood and Affect: Mood normal.        Latest Ref Rng & Units 03/19/2023   10:21 AM  CMP  Glucose 70 - 99 mg/dL 77   BUN 8  - 23 mg/dL 15   Creatinine 9.14 - 1.00 mg/dL 7.82   Sodium 956 - 213 mmol/L 134   Potassium 3.5 - 5.1 mmol/L 3.9   Chloride 98 - 111 mmol/L 99   CO2 22 - 32 mmol/L 24   Calcium 8.9 - 10.3 mg/dL 9.0   Total Protein 6.5 - 8.1 g/dL 8.7   Total Bilirubin 0.0 - 1.2 mg/dL 0.3   Alkaline Phos 38 - 126 U/L 64   AST 15 - 41 U/L 22   ALT 0 - 44 U/L 14       Latest Ref Rng & Units 03/19/2023   10:21 AM  CBC  WBC 4.0 - 10.5 K/uL 8.7   Hemoglobin 12.0 - 15.0 g/dL 08.6   Hematocrit 57.8 - 46.0 % 36.5   Platelets 150 - 400 K/uL 207      RADIOGRAPHIC STUDIES: I have personally reviewed the radiological images as listed and agreed with the findings in the report. No results found.  LABORATORY DATA:  I have reviewed the data as listed    Latest Ref Rng & Units 03/19/2023   10:21 AM 11/10/2022   11:19 AM 08/03/2022   10:15 AM  CBC  WBC 4.0 - 10.5 K/uL 8.7  9.8  9.4   Hemoglobin 12.0 - 15.0 g/dL 46.9  62.9  52.8   Hematocrit 36.0 - 46.0 % 36.5  34.4  34.2   Platelets 150 - 400 K/uL 207  234  228     Recent Labs    08/03/22 1015 11/10/22 1119 03/19/23 1021  NA 138 138 134*  K 3.4* 3.8 3.9  CL 104 105 99  CO2 25 26 24   GLUCOSE 79 79 77  BUN 14 15 15   CREATININE 0.85 1.03* 1.00  CALCIUM 9.2 9.2 9.0  GFRNONAA >60 >60 >60  PROT 8.0 8.3* 8.7*  ALBUMIN 3.3* 3.5 3.5  AST 17 18 22   ALT 16 14 14   ALKPHOS 71 73 64  BILITOT 0.3 0.4 0.3   Iron/TIBC/Ferritin/ %Sat    Component Value Date/Time   IRON 85 08/03/2022 1015   IRON 94 08/22/2012 1142   TIBC 232 (L) 08/03/2022 1015   TIBC 221 (L) 08/22/2012 1142   FERRITIN 124 08/03/2022 1015   FERRITIN 65 08/22/2012 1142   IRONPCTSAT 37 (H) 08/03/2022 1015   IRONPCTSAT 43 08/22/2012 1142

## 2023-03-20 LAB — VISCOSITY, SERUM: Viscosity, Serum: 2.7 rel.saline — ABNORMAL HIGH (ref 1.4–2.1)

## 2023-03-20 LAB — KAPPA/LAMBDA LIGHT CHAINS
Kappa free light chain: 5.5 mg/L (ref 3.3–19.4)
Kappa, lambda light chain ratio: 0.09 — ABNORMAL LOW (ref 0.26–1.65)
Lambda free light chains: 63.4 mg/L — ABNORMAL HIGH (ref 5.7–26.3)

## 2023-03-21 LAB — MULTIPLE MYELOMA PANEL, SERUM
Albumin SerPl Elph-Mcnc: 4 g/dL (ref 2.9–4.4)
Albumin/Glob SerPl: 1 (ref 0.7–1.7)
Alpha 1: 0.2 g/dL (ref 0.0–0.4)
Alpha2 Glob SerPl Elph-Mcnc: 0.8 g/dL (ref 0.4–1.0)
B-Globulin SerPl Elph-Mcnc: 0.8 g/dL (ref 0.7–1.3)
Gamma Glob SerPl Elph-Mcnc: 2.6 g/dL — ABNORMAL HIGH (ref 0.4–1.8)
Globulin, Total: 4.4 g/dL — ABNORMAL HIGH (ref 2.2–3.9)
IgA: 47 mg/dL — ABNORMAL LOW (ref 87–352)
IgG (Immunoglobin G), Serum: 528 mg/dL — ABNORMAL LOW (ref 586–1602)
IgM (Immunoglobulin M), Srm: 4123 mg/dL — ABNORMAL HIGH (ref 26–217)
M Protein SerPl Elph-Mcnc: 2.1 g/dL — ABNORMAL HIGH
Total Protein ELP: 8.4 g/dL (ref 6.0–8.5)

## 2023-04-03 ENCOUNTER — Other Ambulatory Visit: Payer: Self-pay | Admitting: Oncology

## 2023-04-03 DIAGNOSIS — C8510 Unspecified B-cell lymphoma, unspecified site: Secondary | ICD-10-CM

## 2023-04-03 NOTE — Progress Notes (Signed)
 OFF PATHWAY REGIMEN - Lymphoma and CLL  No Change  Continue With Treatment as Ordered.  Original Decision Date/Time: 01/09/2020 21:46   [id number]:Rituximab (Weekly):   Administer weekly:     Rituximab-xxxx   **Always confirm dose/schedule in your pharmacy ordering system**  Patient Characteristics: Disease Type: Not Applicable Disease Type: Not Applicable Disease Type: Not Applicable Intent of Therapy: Non-Curative / Palliative Intent, Discussed with Patient

## 2023-04-04 ENCOUNTER — Other Ambulatory Visit: Payer: Self-pay

## 2023-04-04 ENCOUNTER — Other Ambulatory Visit: Payer: Self-pay | Admitting: *Deleted

## 2023-04-04 ENCOUNTER — Telehealth: Payer: Self-pay

## 2023-04-04 ENCOUNTER — Other Ambulatory Visit: Payer: Self-pay | Admitting: Oncology

## 2023-04-04 ENCOUNTER — Encounter: Payer: Self-pay | Admitting: Oncology

## 2023-04-04 DIAGNOSIS — C8510 Unspecified B-cell lymphoma, unspecified site: Secondary | ICD-10-CM

## 2023-04-04 NOTE — Progress Notes (Addendum)
 Pharmacist Chemotherapy Monitoring - Initial Assessment    Anticipated start date: 04/13/23   The following has been reviewed per standard work regarding the patient's treatment regimen: The patient's diagnosis, treatment plan and drug doses, and organ/hematologic function Lab orders and baseline tests specific to treatment regimen  The treatment plan start date, drug sequencing, and pre-medications Prior authorization status  Patient's documented medication list, including drug-drug interaction screen and prescriptions for anti-emetics and supportive care specific to the treatment regimen The drug concentrations, fluid compatibility, administration routes, and timing of the medications to be used The patient's access for treatment and lifetime cumulative dose history, if applicable  The patient's medication allergies and previous infusion related reactions, if applicable   Changes made to treatment plan:  pre-medications add dexamethasone  Follow up needed:  Pending authorization for treatment  and pending baseline Hep B labs   Sharen Hones, PharmD, BCPS Clinical Pharmacist   04/04/2023  3:23 PM

## 2023-04-04 NOTE — Telephone Encounter (Signed)
 Dr. Cathie Hoops has talked to Laurie Rice (legal guardian) and obtained consent for treatment.   Laurie Rice (cargiver) was contacted and notified about appointments (lab and tx)   She requests that labs on 4/3 be changed to 12:45p.

## 2023-04-04 NOTE — Telephone Encounter (Signed)
-----   Message from Rickard Patience sent at 04/03/2023  9:28 PM EDT ----- Please let patient/care giver know that I recommend repeat rituximab treatments.  I will finalize chemo IS date. Thanks   zy

## 2023-04-04 NOTE — Telephone Encounter (Signed)
-----   Message from Rickard Patience sent at 04/03/2023  9:34 PM EDT ----- Please also arrange her to get hepatitis B panel done this week. Ordered.  Also please notify her legal guardian about our recommendation.  She received Rituximab in 2022 and we obtained consent from guardian Mr. Luiz Blare.  Same treatment

## 2023-04-05 ENCOUNTER — Other Ambulatory Visit

## 2023-04-05 ENCOUNTER — Encounter: Payer: Self-pay | Admitting: Oncology

## 2023-04-05 ENCOUNTER — Inpatient Hospital Stay: Attending: Oncology

## 2023-04-05 ENCOUNTER — Inpatient Hospital Stay

## 2023-04-05 DIAGNOSIS — Z86718 Personal history of other venous thrombosis and embolism: Secondary | ICD-10-CM | POA: Insufficient documentation

## 2023-04-05 DIAGNOSIS — Z5112 Encounter for antineoplastic immunotherapy: Secondary | ICD-10-CM | POA: Insufficient documentation

## 2023-04-05 DIAGNOSIS — C8510 Unspecified B-cell lymphoma, unspecified site: Secondary | ICD-10-CM | POA: Insufficient documentation

## 2023-04-05 DIAGNOSIS — Z7901 Long term (current) use of anticoagulants: Secondary | ICD-10-CM | POA: Diagnosis not present

## 2023-04-05 DIAGNOSIS — D563 Thalassemia minor: Secondary | ICD-10-CM | POA: Diagnosis not present

## 2023-04-05 DIAGNOSIS — D509 Iron deficiency anemia, unspecified: Secondary | ICD-10-CM | POA: Diagnosis present

## 2023-04-05 LAB — HEPATITIS B SURFACE ANTIGEN: Hepatitis B Surface Ag: NONREACTIVE

## 2023-04-06 LAB — HEPATITIS B CORE ANTIBODY, TOTAL: HEP B CORE AB: NEGATIVE

## 2023-04-13 ENCOUNTER — Inpatient Hospital Stay: Admitting: Oncology

## 2023-04-13 ENCOUNTER — Inpatient Hospital Stay

## 2023-04-13 ENCOUNTER — Encounter: Payer: Self-pay | Admitting: Oncology

## 2023-04-13 VITALS — BP 148/89 | HR 69 | Temp 99.0°F | Resp 18

## 2023-04-13 VITALS — BP 129/71 | HR 64 | Temp 97.1°F | Resp 18 | Wt 201.7 lb

## 2023-04-13 DIAGNOSIS — Z5112 Encounter for antineoplastic immunotherapy: Secondary | ICD-10-CM | POA: Diagnosis not present

## 2023-04-13 DIAGNOSIS — C8510 Unspecified B-cell lymphoma, unspecified site: Secondary | ICD-10-CM | POA: Diagnosis not present

## 2023-04-13 DIAGNOSIS — Z86718 Personal history of other venous thrombosis and embolism: Secondary | ICD-10-CM

## 2023-04-13 DIAGNOSIS — D509 Iron deficiency anemia, unspecified: Secondary | ICD-10-CM

## 2023-04-13 LAB — CBC WITH DIFFERENTIAL/PLATELET
Abs Immature Granulocytes: 0.03 10*3/uL (ref 0.00–0.07)
Basophils Absolute: 0.1 10*3/uL (ref 0.0–0.1)
Basophils Relative: 1 %
Eosinophils Absolute: 0.2 10*3/uL (ref 0.0–0.5)
Eosinophils Relative: 3 %
HCT: 34.9 % — ABNORMAL LOW (ref 36.0–46.0)
Hemoglobin: 10.5 g/dL — ABNORMAL LOW (ref 12.0–15.0)
Immature Granulocytes: 0 %
Lymphocytes Relative: 30 %
Lymphs Abs: 2.6 10*3/uL (ref 0.7–4.0)
MCH: 21.6 pg — ABNORMAL LOW (ref 26.0–34.0)
MCHC: 30.1 g/dL (ref 30.0–36.0)
MCV: 71.7 fL — ABNORMAL LOW (ref 80.0–100.0)
Monocytes Absolute: 0.5 10*3/uL (ref 0.1–1.0)
Monocytes Relative: 6 %
Neutro Abs: 5.2 10*3/uL (ref 1.7–7.7)
Neutrophils Relative %: 60 %
Platelets: 284 10*3/uL (ref 150–400)
RBC: 4.87 MIL/uL (ref 3.87–5.11)
RDW: 15.9 % — ABNORMAL HIGH (ref 11.5–15.5)
WBC: 8.6 10*3/uL (ref 4.0–10.5)
nRBC: 0 % (ref 0.0–0.2)

## 2023-04-13 LAB — CMP (CANCER CENTER ONLY)
ALT: 14 U/L (ref 0–44)
AST: 22 U/L (ref 15–41)
Albumin: 3.4 g/dL — ABNORMAL LOW (ref 3.5–5.0)
Alkaline Phosphatase: 67 U/L (ref 38–126)
Anion gap: 10 (ref 5–15)
BUN: 10 mg/dL (ref 8–23)
CO2: 24 mmol/L (ref 22–32)
Calcium: 8.8 mg/dL — ABNORMAL LOW (ref 8.9–10.3)
Chloride: 104 mmol/L (ref 98–111)
Creatinine: 0.84 mg/dL (ref 0.44–1.00)
GFR, Estimated: >60 mL/min (ref >60)
Glucose, Bld: 103 mg/dL — ABNORMAL HIGH (ref 70–99)
Potassium: 3.6 mmol/L (ref 3.5–5.1)
Sodium: 138 mmol/L (ref 135–145)
Total Bilirubin: 0.3 mg/dL (ref 0.0–1.2)
Total Protein: 7.8 g/dL (ref 6.5–8.1)

## 2023-04-13 MED ORDER — SODIUM CHLORIDE 0.9 % IV SOLN
375.0000 mg/m2 | Freq: Once | INTRAVENOUS | Status: AC
  Administered 2023-04-13: 800 mg via INTRAVENOUS
  Filled 2023-04-13: qty 80

## 2023-04-13 MED ORDER — ACETAMINOPHEN 325 MG PO TABS
650.0000 mg | ORAL_TABLET | Freq: Once | ORAL | Status: AC
  Administered 2023-04-13: 650 mg via ORAL
  Filled 2023-04-13: qty 2

## 2023-04-13 MED ORDER — DIPHENHYDRAMINE HCL 25 MG PO CAPS
50.0000 mg | ORAL_CAPSULE | Freq: Once | ORAL | Status: AC
  Administered 2023-04-13: 50 mg via ORAL
  Filled 2023-04-13: qty 2

## 2023-04-13 MED ORDER — SODIUM CHLORIDE 0.9 % IV SOLN
INTRAVENOUS | Status: DC
  Filled 2023-04-13: qty 250

## 2023-04-13 MED ORDER — DEXAMETHASONE SODIUM PHOSPHATE 10 MG/ML IJ SOLN
10.0000 mg | Freq: Once | INTRAMUSCULAR | Status: AC
  Administered 2023-04-13: 10 mg via INTRAVENOUS
  Filled 2023-04-13: qty 1

## 2023-04-13 NOTE — Progress Notes (Addendum)
 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 Bone marrow biopsy reveals marked involvement of lymphoma.   S/p 4 rituximab treatments [2022].   Labs are reviewed and discussed with patient. IgM is >4000, viscosity further increased. Anemia is slightly worse.  Recommend another 4 weekly treatment of Rituximab Rationale and side effects were reviewed with patient's legal guardian Mr.Graves and he agrees.   History of DVT (deep vein thrombosis) Patient is on coumadin, dosed by pcp with a goal of INR 2-3  Microcytic anemia Due to alpha thalassemia trait, as well as lymphoma bone marrow involvement.    Orders Placed This Encounter  Procedures   CBC with Differential (Cancer Center Only)    Standing Status:   Future    Expected Date:   08/13/2023    Expiration Date:   04/12/2024   CMP (Cancer Center only)    Standing Status:   Future    Expected Date:   08/13/2023    Expiration Date:   04/12/2024   Kappa/lambda light chains    Standing Status:   Future    Expected Date:   08/13/2023    Expiration Date:   04/12/2024   Multiple Myeloma Panel (SPEP&IFE w/QIG)    Standing Status:   Future    Expected Date:   08/13/2023    Expiration Date:   04/12/2024   Viscosity, serum    Standing Status:   Future    Expected Date:   08/13/2023    Expiration Date:   04/12/2024   Follow up in 4 months.  All questions were answered. The patient knows to call the clinic with any problems, questions or concerns.  Rickard Patience, MD, PhD [facility] Hematology Oncology 04/13/2023    HISTORY OF PRESENTING ILLNESS:  Laurie Rice is a  63 y.o.  female with PMH listed below follows up for lymphoma 06/10/2017 CBC showed  elevated white count of 38.5, hemoglobin 10.2, MCV 74.7, absolute lymphocyte 30.4, basophil 0.13, Previous lab records reviewed. Leukocytosis onset of chronic, duration is since at least 2014 Anemia is also chronic since at least 2016 No aggravating or elevated factors. Patient is a poor historian.  She has a seizure disorder and meningioma.  She is not medical competent and has legal guardian.  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    Associated symptoms or signs:  Denies weight loss, fever, chills,night sweats.  Recent history of weight loss about 10 pounds over the past 1 year.  Smoking history: Never smoking History of recent oral steroid use or steroid injection: Denies History of recent infection: Denies Autoimmune disease history.  Denies  Reviewed patient's past medical history, was previously seen by Dr. Merlene Pulling with last visit more than 3 years ago.  Today she wants to reestablish care at [facility] site. 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  Phenotype did not suggest a specific histologic type and was not typical for CLL/SLL, mantle cell lymphoma, hairy cell leukemia or follicular cell lymphoma.  BCR ABL testing was negative.  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  Chronic microcytic anemia since 2008.  She had hemoglobin electrophoresis reviewed normal hemoglobin F and  A2 Patient has a history of bilateral DVT and pulmonary embolism in 2010.  Has been on Coumadin chronically She developed right lower extremity DVT on 07/09/2014 secondary to subtherapeutic INR  01/30/2020- 02/20/2020 weekly rituximab treatment x 4  INTERVAL HISTORY Laurie Rice is a 63 y.o. female who has above history reviewed by me today presents for follow up visit for lymphoma. She was accompanied by care giver from group home.  No reports of new complaints.  Appetite is good. She has gained weight    .Review of Systems  Unable to perform ROS: Other (Intellectual disability)  Constitutional:  Positive for unexpected weight change. Negative for fatigue.    MEDICAL HISTORY:  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   History of lymphocytosis    Impetigo    History of   Liver masses    History of, Benign   Meningioma (HCC)    Mild mental retardation    Monoclonal gammopathy    low level   Personal history of pulmonary embolism    Seizures (HCC)    Thyroid mass    history of    SURGICAL HISTORY: Past Surgical History:  Procedure Laterality Date   COLONOSCOPY WITH PROPOFOL N/A 04/03/2022   Procedure: COLONOSCOPY WITH PROPOFOL;  Surgeon: Regis Bill, MD;  Location: [facility] ENDOSCOPY;  Service: Endoscopy;  Laterality: N/A;   NO PAST SURGERIES      SOCIAL HISTORY: Social History   Socioeconomic History   Marital status: Single    Spouse name: Not on file   Number of children: Not on file   Years of education: Not on file   Highest education level: Not on file  Occupational History   Not on file  Tobacco Use   Smoking status: Never   Smokeless tobacco: Never  Substance and Sexual Activity   Alcohol use: No   Drug use: No   Sexual activity: Not on file  Other Topics Concern   Not on file  Social History Narrative   Not on file   Social Drivers of Health   Financial Resource Strain: Low Risk  (08/04/2022)   Received from [facility]   Overall Financial Resource Strain (CARDIA)    Difficulty of Paying Living Expenses: Not hard at all  Food Insecurity: No Food Insecurity (08/04/2022)   Received from [facility]   Hunger Vital Sign    Worried About Running Out of Food in the Last Year: Never true    Ran Out of Food in the Last Year: Never true   Transportation Needs: No Transportation Needs (08/04/2022)   Received from [facility] - Transportation    In the past 12 months, has lack of transportation kept you from medical appointments or from getting medications?: No    Lack of Transportation (Non-Medical): No  Physical Activity: Not on file  Stress: Not on file  Social Connections: Not on file  Intimate Partner Violence: Not on file    FAMILY HISTORY: Family History  Problem Relation Age of Onset   Hypertension Other    Arthritis Other    Breast cancer Neg Hx     ALLERGIES:  is allergic to dalbavancin.  MEDICATIONS:  Current Outpatient Medications  Medication Sig Dispense Refill   amLODipine (NORVASC) 10  MG tablet Take 10 mg by mouth daily.     bisoprolol-hydrochlorothiazide (ZIAC) 2.5-6.25 MG tablet Take 2 tablets by mouth daily.     dupilumab (DUPIXENT) 300 MG/2ML prefilled syringe Inject 300 mg into the skin every 14 (fourteen) days. Starting at day 15 for maintenance. 4 mL 6   Emollient (CETAPHIL) cream      ferrous sulfate 325 (65 FE) MG EC tablet Take 1 tablet (325 mg total) by mouth daily with breakfast. With orange juice 30 tablet 3   folic acid (FOLVITE) 1 MG tablet TAKE ONE TABLET BY MOUTH EACH DAY.     ketoconazole (NIZORAL) 2 % cream Apply twice daily to abdominal area for 2 weeks. Apply 1st 60 g 2   lamoTRIgine (LAMICTAL) 100 MG tablet Take 100 mg by mouth 2 (two) times daily.     mupirocin ointment (BACTROBAN) 2 % Apply daily to any open sores or wounds as needed. 22 g 3   PHENObarbital (LUMINAL) 60 MG tablet Take 60 mg by mouth at bedtime.     triamcinolone ointment (KENALOG) 0.1 % APPLY TO AFFECTED AREAS ECZEMA ONCE DAILY UP TO 5 DAYS A WEEK AS NEEDED FOR FLARES **EXTERNAL USE ONLY** AVOID FACE, GROIN, AND AXILLA 80 g 9   warfarin (COUMADIN) 1 MG tablet Take 1 tablet (1 mg total) by mouth every Monday, Wednesday, and Friday. Take with (2) 4 mg tablets every Monday, Wednesday, and  Friday for a total of 9 mg on those days. 15 tablet 0   phenytoin (DILANTIN) 100 MG ER capsule TAKE 2 CAPSULES BY MOUTH AT BEDTIME (SEIZURE DISORDER) (Patient not taking: Reported on 04/13/2023)     phenytoin (DILANTIN) 50 MG tablet Chew 50 mg by mouth at bedtime. (Patient not taking: Reported on 04/13/2023)     warfarin (COUMADIN) 10 MG tablet Take 10 mg by mouth daily. (Patient not taking: Reported on 04/13/2023)     warfarin (COUMADIN) 4 MG tablet Take 2 tablets (8 mg total) by mouth daily at 4 PM. Take 2 tablets daily for 8 mg daily. Add (1) 1 mg tablet on MWF for a total of 9 mg on those days. (Patient not taking: Reported on 08/03/2022) 60 tablet 0   No current facility-administered medications for this visit.   Facility-Administered Medications Ordered in Other Visits  Medication Dose Route Frequency Provider Last Rate Last Admin   0.9 %  sodium chloride infusion   Intravenous Continuous Rickard Patience, MD       acetaminophen (TYLENOL) tablet 650 mg  650 mg Oral Once Rickard Patience, MD       diphenhydrAMINE (BENADRYL) capsule 50 mg  50 mg Oral Once Rickard Patience, MD       riTUXimab-pvvr (RUXIENCE) 800 mg in sodium chloride 0.9 % 250 mL (2.4242 mg/mL) infusion  375 mg/m2 (Order-Specific) Intravenous Once Rickard Patience, MD         PHYSICAL EXAMINATION: ECOG PERFORMANCE STATUS: 1 - Symptomatic but completely ambulatory Vitals:   04/13/23 0842  BP: 129/71  Pulse: 64  Resp: 18  Temp: (!) 97.1 F (36.2 C)   Filed Weights   04/13/23 0842  Weight: 201 lb 11.2 oz (91.5 kg)    Physical Exam Constitutional:      General: She is not in acute distress. HENT:     Head: Normocephalic and atraumatic.  Eyes:     General: No scleral icterus. Cardiovascular:     Rate and Rhythm: Normal rate.  Pulmonary:     Effort: Pulmonary effort is  normal. No respiratory distress.     Breath sounds: No wheezing.  Abdominal:     General: Bowel sounds are normal. There is no distension.     Palpations: Abdomen is soft.   Musculoskeletal:        General: Swelling present. No deformity. Normal range of motion.     Cervical back: Normal range of motion and neck supple.     Comments: Bilateral lower extremity swelling, right worse than left.  Skin:    General: Skin is warm and dry.     Findings: No erythema or rash.     Comments: hyperpigmentation changes of lower extremities and multiple areas of low back  Neurological:     Mental Status: She is alert. Mental status is at baseline.  Psychiatric:        Mood and Affect: Mood normal.        Latest Ref Rng & Units 04/13/2023    8:16 AM  CMP  Glucose 70 - 99 mg/dL 147   BUN 8 - 23 mg/dL 10   Creatinine 8.29 - 1.00 mg/dL 5.62   Sodium 130 - 865 mmol/L 138   Potassium 3.5 - 5.1 mmol/L 3.6   Chloride 98 - 111 mmol/L 104   CO2 22 - 32 mmol/L 24   Calcium 8.9 - 10.3 mg/dL 8.8   Total Protein 6.5 - 8.1 g/dL 7.8   Total Bilirubin 0.0 - 1.2 mg/dL 0.3   Alkaline Phos 38 - 126 U/L 67   AST 15 - 41 U/L 22   ALT 0 - 44 U/L 14       Latest Ref Rng & Units 04/13/2023    8:16 AM  CBC  WBC 4.0 - 10.5 K/uL 8.6   Hemoglobin 12.0 - 15.0 g/dL 78.4   Hematocrit 69.6 - 46.0 % 34.9   Platelets 150 - 400 K/uL 284      RADIOGRAPHIC STUDIES: I have personally reviewed the radiological images as listed and agreed with the findings in the report. No results found.  LABORATORY DATA:  I have reviewed the data as listed    Latest Ref Rng & Units 04/13/2023    8:16 AM 03/19/2023   10:21 AM 11/10/2022   11:19 AM  CBC  WBC 4.0 - 10.5 K/uL 8.6  8.7  9.8   Hemoglobin 12.0 - 15.0 g/dL 29.5  28.4  13.2   Hematocrit 36.0 - 46.0 % 34.9  36.5  34.4   Platelets 150 - 400 K/uL 284  207  234     Recent Labs    11/10/22 1119 03/19/23 1021 04/13/23 0816  NA 138 134* 138  K 3.8 3.9 3.6  CL 105 99 104  CO2 26 24 24   GLUCOSE 79 77 103*  BUN 15 15 10   CREATININE 1.03* 1.00 0.84  CALCIUM 9.2 9.0 8.8*  GFRNONAA >60 >60 >60  PROT 8.3* 8.7* 7.8  ALBUMIN 3.5 3.5 3.4*   AST 18 22 22   ALT 14 14 14   ALKPHOS 73 64 67  BILITOT 0.4 0.3 0.3   Iron/TIBC/Ferritin/ %Sat    Component Value Date/Time   IRON 85 08/03/2022 1015   IRON 94 08/22/2012 1142   TIBC 232 (L) 08/03/2022 1015   TIBC 221 (L) 08/22/2012 1142   FERRITIN 124 08/03/2022 1015   FERRITIN 65 08/22/2012 1142   IRONPCTSAT 37 (H) 08/03/2022 1015   IRONPCTSAT 43 08/22/2012 1142

## 2023-04-13 NOTE — Progress Notes (Signed)
 [facility] CSW Progress Note  Visual merchandiser met with patient while she was in infusion to assess needs.  She engaged in conversation and answered questions appropriately.  She was watching tv, which she said she enjoys.  She talked about living in a group home.  She expressed getting all of her needs met.  CSW provided active listening and supportive counseling.    Dorothey Baseman, LCSW Clinical Social Worker [facility]    Patient is participating in a Managed Medicaid Plan:  Yes

## 2023-04-13 NOTE — Addendum Note (Signed)
 Addended by: Rickard Patience on: 04/13/2023 09:33 AM   Modules accepted: Orders

## 2023-04-13 NOTE — Progress Notes (Signed)
 Rapid Infusion Rituximab Pharmacist Evaluation  Laurie Rice is a 63 y.o. female being treated with rituximab for Non-Hodgkins Lymphoma. This patient may be considered for RIR.   A pharmacist has verified the patient tolerated rituximab infusions per the [facility] standard infusion protocol without grade 3-4 infusion reactions. The treatment plan will be updated to reflect RIR if the patient qualifies per the checklist below:   Age > 84 years old Yes   Clinically significant cardiovascular disease No   Circulating lymphocyte count < 5000/uL prior to cycle two Yes  Lab Results  Component Value Date   LYMPHSABS 2.6 04/13/2023    Prior documented grade 3-4 infusion reaction to rituximab No   Prior documented grade 1-2 infusion reaction to rituximab (If YES, Pharmacist will confirm with Physician if patient is still a candidate for RIR) No   Previous rituximab infusion within the past 6 months Yes   Treatment Plan updated orders to reflect RIR Yes    Laurie Rice does meet the criteria for Rapid Infusion Rituximab. This patient is going to be switched to rapid infusion rituximab.   Sharen Hones, PharmD, BCPS Clinical Pharmacist   04/13/23 2:07 PM

## 2023-04-13 NOTE — Assessment & Plan Note (Signed)
Due to alpha thalassemia trait, as well as lymphoma bone marrow involvement.

## 2023-04-13 NOTE — Assessment & Plan Note (Signed)
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 Bone marrow biopsy reveals marked involvement of lymphoma.   S/p 4 rituximab treatments [2022].   Labs are reviewed and discussed with patient. IgM is >4000, viscosity further increased. Anemia is slightly worse.  Recommend another 4 weekly treatment of Rituximab Rationale and side effects were reviewed with patient's legal guardian Mr.Graves and he agrees.

## 2023-04-13 NOTE — Assessment & Plan Note (Signed)
Patient is on coumadin, dosed by pcp with a goal of INR 2-3

## 2023-04-20 ENCOUNTER — Ambulatory Visit

## 2023-04-20 ENCOUNTER — Inpatient Hospital Stay

## 2023-04-20 VITALS — BP 128/66 | HR 60 | Temp 99.0°F | Resp 18

## 2023-04-20 DIAGNOSIS — C8510 Unspecified B-cell lymphoma, unspecified site: Secondary | ICD-10-CM

## 2023-04-20 DIAGNOSIS — Z5112 Encounter for antineoplastic immunotherapy: Secondary | ICD-10-CM | POA: Diagnosis not present

## 2023-04-20 MED ORDER — SODIUM CHLORIDE 0.9 % IV SOLN
375.0000 mg/m2 | Freq: Once | INTRAVENOUS | Status: AC
  Administered 2023-04-20: 800 mg via INTRAVENOUS
  Filled 2023-04-20: qty 30

## 2023-04-20 MED ORDER — ACETAMINOPHEN 325 MG PO TABS
650.0000 mg | ORAL_TABLET | Freq: Once | ORAL | Status: AC
  Administered 2023-04-20: 650 mg via ORAL
  Filled 2023-04-20: qty 2

## 2023-04-20 MED ORDER — DEXAMETHASONE SODIUM PHOSPHATE 10 MG/ML IJ SOLN
10.0000 mg | Freq: Once | INTRAMUSCULAR | Status: AC
Start: 2023-04-20 — End: 2023-04-20
  Administered 2023-04-20: 10 mg via INTRAVENOUS
  Filled 2023-04-20: qty 1

## 2023-04-20 MED ORDER — SODIUM CHLORIDE 0.9 % IV SOLN
INTRAVENOUS | Status: DC
Start: 2023-04-20 — End: 2023-04-20
  Filled 2023-04-20: qty 250

## 2023-04-20 MED ORDER — DIPHENHYDRAMINE HCL 25 MG PO CAPS
50.0000 mg | ORAL_CAPSULE | Freq: Once | ORAL | Status: AC
  Administered 2023-04-20: 50 mg via ORAL
  Filled 2023-04-20: qty 2

## 2023-04-27 ENCOUNTER — Encounter (INDEPENDENT_AMBULATORY_CARE_PROVIDER_SITE_OTHER): Payer: Self-pay | Admitting: Vascular Surgery

## 2023-04-27 ENCOUNTER — Inpatient Hospital Stay

## 2023-04-27 ENCOUNTER — Ambulatory Visit

## 2023-04-27 ENCOUNTER — Ambulatory Visit (INDEPENDENT_AMBULATORY_CARE_PROVIDER_SITE_OTHER): Admitting: Vascular Surgery

## 2023-04-27 VITALS — BP 145/73 | HR 61 | Resp 16 | Wt 197.2 lb

## 2023-04-27 VITALS — BP 143/71 | HR 66 | Temp 97.4°F | Resp 18 | Wt 197.3 lb

## 2023-04-27 DIAGNOSIS — C8510 Unspecified B-cell lymphoma, unspecified site: Secondary | ICD-10-CM

## 2023-04-27 DIAGNOSIS — I10 Essential (primary) hypertension: Secondary | ICD-10-CM | POA: Diagnosis not present

## 2023-04-27 DIAGNOSIS — I890 Lymphedema, not elsewhere classified: Secondary | ICD-10-CM | POA: Diagnosis not present

## 2023-04-27 DIAGNOSIS — I87009 Postthrombotic syndrome without complications of unspecified extremity: Secondary | ICD-10-CM

## 2023-04-27 DIAGNOSIS — Z5112 Encounter for antineoplastic immunotherapy: Secondary | ICD-10-CM | POA: Diagnosis not present

## 2023-04-27 MED ORDER — ACETAMINOPHEN 325 MG PO TABS
650.0000 mg | ORAL_TABLET | Freq: Once | ORAL | Status: AC
  Administered 2023-04-27: 650 mg via ORAL
  Filled 2023-04-27: qty 2

## 2023-04-27 MED ORDER — DEXAMETHASONE SODIUM PHOSPHATE 10 MG/ML IJ SOLN
10.0000 mg | Freq: Once | INTRAMUSCULAR | Status: AC
Start: 2023-04-27 — End: 2023-04-27
  Administered 2023-04-27: 10 mg via INTRAVENOUS
  Filled 2023-04-27: qty 1

## 2023-04-27 MED ORDER — SODIUM CHLORIDE 0.9 % IV SOLN
INTRAVENOUS | Status: DC
Start: 2023-04-27 — End: 2023-04-27
  Filled 2023-04-27: qty 250

## 2023-04-27 MED ORDER — RITUXIMAB-PVVR CHEMO 500 MG/50ML IV SOLN
375.0000 mg/m2 | Freq: Once | INTRAVENOUS | Status: AC
  Administered 2023-04-27: 800 mg via INTRAVENOUS
  Filled 2023-04-27: qty 30

## 2023-04-27 MED ORDER — DIPHENHYDRAMINE HCL 25 MG PO CAPS
50.0000 mg | ORAL_CAPSULE | Freq: Once | ORAL | Status: AC
  Administered 2023-04-27: 50 mg via ORAL
  Filled 2023-04-27: qty 2

## 2023-04-27 NOTE — Patient Instructions (Signed)
 [facility] - A DEPT OF [facility]  Discharge Instructions: Thank you for choosing [facility] to provide your oncology and hematology care.  If you have a lab appointment with the Cancer Center, please go directly to the Cancer Center and check in at the registration area.  Wear comfortable clothing and clothing appropriate for easy access to any Portacath or PICC line.   We strive to give you quality time with your provider. You may need to reschedule your appointment if you arrive late (15 or more minutes).  Arriving late affects you and other patients whose appointments are after yours.  Also, if you miss three or more appointments without notifying the office, you may be dismissed from the clinic at the provider's discretion.      For prescription refill requests, have your pharmacy contact our office and allow 72 hours for refills to be completed.    Today you received the following chemotherapy and/or immunotherapy agents Rituximab      To help prevent nausea and vomiting after your treatment, we encourage you to take your nausea medication as directed.  BELOW ARE SYMPTOMS THAT SHOULD BE REPORTED IMMEDIATELY: *FEVER GREATER THAN 100.4 F (38 C) OR HIGHER *CHILLS OR SWEATING *NAUSEA AND VOMITING THAT IS NOT CONTROLLED WITH YOUR NAUSEA MEDICATION *UNUSUAL SHORTNESS OF BREATH *UNUSUAL BRUISING OR BLEEDING *URINARY PROBLEMS (pain or burning when urinating, or frequent urination) *BOWEL PROBLEMS (unusual diarrhea, constipation, pain near the anus) TENDERNESS IN MOUTH AND THROAT WITH OR WITHOUT PRESENCE OF ULCERS (sore throat, sores in mouth, or a toothache) UNUSUAL RASH, SWELLING OR PAIN  UNUSUAL VAGINAL DISCHARGE OR ITCHING   Items with * indicate a potential emergency and should be followed up as soon as possible or go to the Emergency Department if any problems should occur.  Please show the CHEMOTHERAPY ALERT CARD or IMMUNOTHERAPY  ALERT CARD at check-in to the Emergency Department and triage nurse.  Should you have questions after your visit or need to cancel or reschedule your appointment, please contact [facility] - A DEPT OF [facility]  [phone] and follow the prompts.  Office hours are 8:00 a.m. to 4:30 p.m. Monday - Friday. Please note that voicemails left after 4:00 p.m. may not be returned until the following business day.  We are closed weekends and major holidays. You have access to a nurse at all times for urgent questions. Please call the main number to the clinic [phone] and follow the prompts.  For any non-urgent questions, you may also contact your provider using MyChart. We now offer e-Visits for anyone 63 and older to request care online for non-urgent symptoms. For details visit [email or web address].   Also download the MyChart app! Go to the app store, search "MyChart", open the app, select [facility], and log in with your MyChart username and password.

## 2023-04-27 NOTE — Assessment & Plan Note (Signed)
 Increases the propensity towards lymphedema.

## 2023-04-27 NOTE — Progress Notes (Signed)
 MRN : [id number]  Laurie Rice is a 63 y.o. (November 03, 1960) female who presents with chief complaint of  Chief Complaint  Patient presents with   Follow-up    69yr follow up  .  History of Present Illness: Patient returns today in follow up of lymphedema and postphlebitic symptoms.  She has been regularly using 20 to 30 mmHg compression socks, elevating her legs, and uses the lymphedema pump daily.  This is really done an excellent job of keeping her swelling under good control.  No significant pain.  No new ulceration or infection.  Current Outpatient Medications  Medication Sig Dispense Refill   amLODipine (NORVASC) 10 MG tablet Take 10 mg by mouth daily.     bisoprolol -hydrochlorothiazide  (ZIAC ) 2.5-6.25 MG tablet Take 2 tablets by mouth daily.     dupilumab  (DUPIXENT ) 300 MG/2ML prefilled syringe Inject 300 mg into the skin every 14 (fourteen) days. Starting at day 15 for maintenance. 4 mL 6   Emollient (CETAPHIL) cream      ferrous sulfate  325 (65 FE) MG EC tablet Take 1 tablet (325 mg total) by mouth daily with breakfast. With orange juice 30 tablet 3   folic acid  (FOLVITE ) 1 MG tablet TAKE ONE TABLET BY MOUTH EACH DAY.     ketoconazole  (NIZORAL ) 2 % cream Apply twice daily to abdominal area for 2 weeks. Apply 1st 60 g 2   lamoTRIgine (LAMICTAL) 100 MG tablet Take 100 mg by mouth 2 (two) times daily.     mupirocin  ointment (BACTROBAN ) 2 % Apply daily to any open sores or wounds as needed. 22 g 3   PHENObarbital  (LUMINAL) 60 MG tablet Take 60 mg by mouth at bedtime.     tacrolimus  (PROTOPIC ) 0.1 % ointment Apply topically as needed.     triamcinolone  ointment (KENALOG ) 0.1 % APPLY TO AFFECTED AREAS ECZEMA ONCE DAILY UP TO 5 DAYS A WEEK AS NEEDED FOR FLARES **EXTERNAL USE ONLY** AVOID FACE, GROIN, AND AXILLA 80 g 9   warfarin (COUMADIN ) 1 MG tablet Take 1 tablet (1 mg total) by mouth every Monday, Wednesday, and Friday. Take with (2) 4 mg tablets every Monday, Wednesday, and  Friday for a total of 9 mg on those days. 15 tablet 0   warfarin (COUMADIN ) 4 MG tablet Take 2 tablets (8 mg total) by mouth daily at 4 PM. Take 2 tablets daily for 8 mg daily. Add (1) 1 mg tablet on MWF for a total of 9 mg on those days. 60 tablet 0   phenytoin  (DILANTIN ) 100 MG ER capsule TAKE 2 CAPSULES BY MOUTH AT BEDTIME (SEIZURE DISORDER) (Patient not taking: Reported on 04/13/2023)     phenytoin  (DILANTIN ) 50 MG tablet Chew 50 mg by mouth at bedtime. (Patient not taking: Reported on 04/13/2023)     warfarin (COUMADIN ) 10 MG tablet Take 10 mg by mouth daily. (Patient not taking: Reported on 04/27/2023)     No current facility-administered medications for this visit.    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    History of lymphocytosis    Impetigo    History of   Liver masses    History of, Benign   Meningioma (HCC)    Mild mental retardation    Monoclonal gammopathy    low level   Personal history of pulmonary  embolism    Seizures (HCC)    Thyroid  mass    history of    Past Surgical History:  Procedure Laterality Date   COLONOSCOPY WITH PROPOFOL  N/A 04/03/2022   Procedure: COLONOSCOPY WITH PROPOFOL ;  Surgeon: Shane Darling, MD;  Location: [facility] ENDOSCOPY;  Service: Endoscopy;  Laterality: N/A;   NO PAST SURGERIES       Social History   Tobacco Use   Smoking status: Never   Smokeless tobacco: Never  Substance Use Topics   Alcohol use: No   Drug use: No      Family History  Problem Relation Age of Onset   Hypertension Other    Arthritis Other    Breast cancer Neg Hx      Allergies  Allergen Reactions   Dalbavancin Rash    Widespread rash with no systemic symptoms     REVIEW OF SYSTEMS (Negative unless checked)   Constitutional: [] Weight loss  [] Fever  [] Chills Cardiac: [] Chest pain   [] Chest pressure   [] Palpitations    [] Shortness of breath when laying flat   [] Shortness of breath at rest   [] Shortness of breath with exertion. Vascular:  [] Pain in legs with walking   [] Pain in legs at rest   [] Pain in legs when laying flat   [] Claudication   [] Pain in feet when walking  [] Pain in feet at rest  [] Pain in feet when laying flat   [x] History of DVT   [x] Phlebitis   [x] Swelling in legs   [] Varicose veins   [] Non-healing ulcers Pulmonary:   [] Uses home oxygen   [] Productive cough   [] Hemoptysis   [] Wheeze  [] COPD   [] Asthma Neurologic:  [] Dizziness  [] Blackouts   [x] Seizures   [] History of stroke   [] History of TIA  [] Aphasia   [] Temporary blindness   [] Dysphagia   [] Weakness or numbness in arms   [] Weakness or numbness in legs Musculoskeletal:  [x] Arthritis   [] Joint swelling   [x] Joint pain   [] Low back pain Hematologic:  [] Easy bruising  [] Easy bleeding   [] Hypercoagulable state   [x] Anemic   Gastrointestinal:  [] Blood in stool   [] Vomiting blood  [] Gastroesophageal reflux/heartburn   [] Abdominal pain Genitourinary:  [] Chronic kidney disease   [] Difficult urination  [] Frequent urination  [] Burning with urination   [] Hematuria Skin:  [] Rashes   [] Ulcers   [] Wounds Psychological:  [] History of anxiety   []  History of major depression.  Physical Examination  BP (!) 145/73   Pulse 61   Resp 16   Wt 197 lb 3.2 oz (89.4 kg)   BMI 34.93 kg/m  Gen:  WD/WN, NAD Head: [place]/AT, No temporalis wasting. Ear/Nose/Throat: Hearing grossly intact, nares w/o erythema or drainage Eyes: Conjunctiva clear. Sclera non-icteric.  Dentition poor Neck: Supple.  Trachea midline Pulmonary:  Good air movement, no use of accessory muscles.  Cardiac: RRR, no JVD Vascular:  Vessel Right Left  Radial Palpable Palpable                          PT Palpable Palpable  DP Palpable Palpable   Gastrointestinal: soft, non-tender/non-distended. No guarding/reflex.  Musculoskeletal: M/S 5/5 throughout.  No deformity or atrophy.  1+  bilateral lower extremity edema. Neurologic: Sensation grossly intact in extremities.  Symmetrical.  Speech is fluent.  Psychiatric: Judgment and insight are fair at best, poor historian Dermatologic: No rashes or ulcers noted.  No cellulitis or open wounds.      Labs Recent Results (from  the past 2160 hours)  Viscosity, serum     Status: Abnormal   Collection Time: 03/19/23 10:21 AM  Result Value Ref Range   Viscosity, Serum 2.7 (H) 1.4 - 2.1 rel.saline    Comment: (NOTE) **Results verified by repeat testing** This test was developed and its performance characteristics determined by [facility]. It has not been cleared or approved by the Food and Drug Administration. Values above 2.7 may indicate paraproteinemia is present. Performed At: [facility] [address] [place], [place] [id number] Pearlean Botts MD [id number]   Kappa/lambda light chains     Status: Abnormal   Collection Time: 03/19/23 10:21 AM  Result Value Ref Range   Kappa free light chain 5.5 3.3 - 19.4 mg/L   Lambda free light chains 63.4 (H) 5.7 - 26.3 mg/L   Kappa, lambda light chain ratio 0.09 (L) 0.26 - 1.65    Comment: (NOTE) Performed At: [facility] [address] [place], [place] [id number] Pearlean Botts MD [id number]   Multiple Myeloma Panel (SPEP&IFE w/QIG)     Status: Abnormal   Collection Time: 03/19/23 10:21 AM  Result Value Ref Range   IgG (Immunoglobin G), Serum 528 (L) 586 - 1,602 mg/dL   IgA 47 (L) 87 - 630 mg/dL    Comment: Result confirmed on concentration.   IgM (Immunoglobulin M), Srm 4,123 (H) 26 - 217 mg/dL    Comment: (NOTE) Results confirmed on dilution.    Total Protein ELP 8.4 6.0 - 8.5 g/dL   Albumin SerPl Elph-Mcnc 4.0 2.9 - 4.4 g/dL   Alpha 1 0.2 0.0 - 0.4 g/dL   Alpha2 Glob SerPl Elph-Mcnc 0.8 0.4 - 1.0 g/dL   B-Globulin SerPl Elph-Mcnc 0.8 0.7 - 1.3 g/dL   Gamma Glob SerPl Elph-Mcnc 2.6 (H) 0.4 - 1.8 g/dL   M Protein SerPl Elph-Mcnc 2.1 (H) Not  Observed g/dL   Globulin, Total 4.4 (H) 2.2 - 3.9 g/dL   Albumin/Glob SerPl 1.0 0.7 - 1.7   IFE 1 Comment (A)     Comment: (NOTE) Immunofixation shows IgM monoclonal protein with lambda light chain specificity.    Please Note Comment     Comment: (NOTE) Protein electrophoresis scan will follow via computer, mail, or courier delivery. Performed At: [facility] [address] [place], [place] [id number] Pearlean Botts MD [id number]   CBC with Differential (Cancer Center Only)     Status: Abnormal   Collection Time: 03/19/23 10:21 AM  Result Value Ref Range   WBC Count 8.7 4.0 - 10.5 K/uL   RBC 5.02 3.87 - 5.11 MIL/uL   Hemoglobin 10.7 (L) 12.0 - 15.0 g/dL    Comment: Reticulocyte Hemoglobin testing may be clinically indicated, consider ordering this additional test [id number]    HCT 36.5 36.0 - 46.0 %   MCV 72.7 (L) 80.0 - 100.0 fL   MCH 21.3 (L) 26.0 - 34.0 pg   MCHC 29.3 (L) 30.0 - 36.0 g/dL   RDW 31.5 (H) 17.6 - 16.0 %   Platelet Count 207 150 - 400 K/uL   nRBC 0.0 0.0 - 0.2 %   Neutrophils Relative % 61 %   Neutro Abs 5.4 1.7 - 7.7 K/uL   Lymphocytes Relative 29 %   Lymphs Abs 2.5 0.7 - 4.0 K/uL   Monocytes Relative 7 %   Monocytes Absolute 0.6 0.1 - 1.0 K/uL   Eosinophils Relative 1 %   Eosinophils Absolute 0.1 0.0 - 0.5 K/uL   Basophils Relative 1 %   Basophils Absolute  0.1 0.0 - 0.1 K/uL   Immature Granulocytes 1 %   Abs Immature Granulocytes 0.06 0.00 - 0.07 K/uL    Comment: Performed at [facility], [address] [place], [place] [address]  CMP (Cancer Center only)     Status: Abnormal   Collection Time: 03/19/23 10:21 AM  Result Value Ref Range   Sodium 134 (L) 135 - 145 mmol/L   Potassium 3.9 3.5 - 5.1 mmol/L   Chloride 99 98 - 111 mmol/L   CO2 24 22 - 32 mmol/L   Glucose, Bld 77 70 - 99 mg/dL    Comment: Glucose reference range applies only to samples taken after fasting for at least 8 hours.   BUN 15 8 - 23 mg/dL   Creatinine  6.21 3.08 - 1.00 mg/dL   Calcium  9.0 8.9 - 10.3 mg/dL   Total Protein 8.7 (H) 6.5 - 8.1 g/dL   Albumin 3.5 3.5 - 5.0 g/dL   AST 22 15 - 41 U/L   ALT 14 0 - 44 U/L   Alkaline Phosphatase 64 38 - 126 U/L   Total Bilirubin 0.3 0.0 - 1.2 mg/dL   GFR, Estimated >65 >78 mL/min    Comment: (NOTE) Calculated using the CKD-EPI Creatinine Equation (2021)    Anion gap 11 5 - 15    Comment: Performed at [facility], [address] [place], [place] [address]  Hepatitis B core antibody, total     Status: None   Collection Time: 04/05/23  9:09 AM  Result Value Ref Range   HEP B CORE AB Negative Negative    Comment: (NOTE) Performed At: [facility] [address] [place], [place] [id number] Pearlean Botts MD [id number]   Hepatitis B surface antigen     Status: None   Collection Time: 04/05/23  9:09 AM  Result Value Ref Range   Hepatitis B Surface Ag NON REACTIVE NON REACTIVE    Comment: Performed at [facility], [address], [place], [place] [address]  CMP (Cancer Center only)     Status: Abnormal   Collection Time: 04/13/23  8:16 AM  Result Value Ref Range   Sodium 138 135 - 145 mmol/L   Potassium 3.6 3.5 - 5.1 mmol/L   Chloride 104 98 - 111 mmol/L   CO2 24 22 - 32 mmol/L   Glucose, Bld 103 (H) 70 - 99 mg/dL    Comment: Glucose reference range applies only to samples taken after fasting for at least 8 hours.   BUN 10 8 - 23 mg/dL   Creatinine 4.25 9.56 - 1.00 mg/dL   Calcium  8.8 (L) 8.9 - 10.3 mg/dL   Total Protein 7.8 6.5 - 8.1 g/dL   Albumin 3.4 (L) 3.5 - 5.0 g/dL   AST 22 15 - 41 U/L   ALT 14 0 - 44 U/L   Alkaline Phosphatase 67 38 - 126 U/L   Total Bilirubin 0.3 0.0 - 1.2 mg/dL   GFR, Estimated >38 >75 mL/min    Comment: (NOTE) Calculated using the CKD-EPI Creatinine Equation (2021)    Anion gap 10 5 - 15    Comment: Performed at [facility], [address] [place], [place] [address]  CBC with Differential/Platelet     Status: Abnormal    Collection Time: 04/13/23  8:16 AM  Result Value Ref Range   WBC 8.6 4.0 - 10.5 K/uL   RBC 4.87 3.87 - 5.11 MIL/uL   Hemoglobin 10.5 (L) 12.0 -  15.0 g/dL    Comment: Reticulocyte Hemoglobin testing may be clinically indicated, consider ordering this additional test [id number]    HCT 34.9 (L) 36.0 - 46.0 %   MCV 71.7 (L) 80.0 - 100.0 fL   MCH 21.6 (L) 26.0 - 34.0 pg   MCHC 30.1 30.0 - 36.0 g/dL   RDW 54.0 (H) 98.1 - 19.1 %   Platelets 284 150 - 400 K/uL   nRBC 0.0 0.0 - 0.2 %   Neutrophils Relative % 60 %   Neutro Abs 5.2 1.7 - 7.7 K/uL   Lymphocytes Relative 30 %   Lymphs Abs 2.6 0.7 - 4.0 K/uL   Monocytes Relative 6 %   Monocytes Absolute 0.5 0.1 - 1.0 K/uL   Eosinophils Relative 3 %   Eosinophils Absolute 0.2 0.0 - 0.5 K/uL   Basophils Relative 1 %   Basophils Absolute 0.1 0.0 - 0.1 K/uL   Immature Granulocytes 0 %   Abs Immature Granulocytes 0.03 0.00 - 0.07 K/uL    Comment: Performed at [facility], [address], [place], [place] [address]    Radiology No results found.  Assessment/Plan  Lymphedema The lymphedema pump and daily use of 20 to 30 mmHg compression socks are really done an excellent job of keeping her swelling under good control.  Continue these modalities.  We will continue to follow her on a basis.  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  Essential hypertension blood pressure control important in reducing the progression of atherosclerotic disease. On appropriate oral medications.     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  The lymphedema pump and her compression socks have gotten this under much better control.  Continue these modalities.  Return to clinic in 1 year.  Mikki Alexander, MD  04/27/2023 10:27 AM    This note was created with Dragon medical transcription system.  Any errors from dictation are  purely unintentional

## 2023-04-27 NOTE — Assessment & Plan Note (Signed)
 The lymphedema pump and daily use of 20 to 30 mmHg compression socks are really done an excellent job of keeping her swelling under good control.  Continue these modalities.  We will continue to follow her on a basis.

## 2023-04-29 ENCOUNTER — Other Ambulatory Visit: Payer: Self-pay

## 2023-05-04 ENCOUNTER — Inpatient Hospital Stay: Attending: Oncology

## 2023-05-04 ENCOUNTER — Ambulatory Visit

## 2023-05-04 ENCOUNTER — Inpatient Hospital Stay

## 2023-05-04 VITALS — BP 136/82 | HR 64 | Temp 98.1°F | Resp 18 | Wt 197.3 lb

## 2023-05-04 DIAGNOSIS — C8510 Unspecified B-cell lymphoma, unspecified site: Secondary | ICD-10-CM | POA: Insufficient documentation

## 2023-05-04 DIAGNOSIS — Z5112 Encounter for antineoplastic immunotherapy: Secondary | ICD-10-CM | POA: Insufficient documentation

## 2023-05-04 MED ORDER — DIPHENHYDRAMINE HCL 25 MG PO CAPS
50.0000 mg | ORAL_CAPSULE | Freq: Once | ORAL | Status: AC
Start: 2023-05-04 — End: 2023-05-04
  Administered 2023-05-04: 50 mg via ORAL
  Filled 2023-05-04: qty 2

## 2023-05-04 MED ORDER — DEXAMETHASONE SODIUM PHOSPHATE 10 MG/ML IJ SOLN
10.0000 mg | Freq: Once | INTRAMUSCULAR | Status: AC
  Administered 2023-05-04: 10 mg via INTRAVENOUS
  Filled 2023-05-04: qty 1

## 2023-05-04 MED ORDER — SODIUM CHLORIDE 0.9 % IV SOLN
375.0000 mg/m2 | Freq: Once | INTRAVENOUS | Status: AC
  Administered 2023-05-04: 800 mg via INTRAVENOUS
  Filled 2023-05-04: qty 30
  Filled 2023-05-04: qty 80

## 2023-05-04 MED ORDER — SODIUM CHLORIDE 0.9 % IV SOLN
INTRAVENOUS | Status: DC
  Filled 2023-05-04: qty 250

## 2023-05-04 MED ORDER — ACETAMINOPHEN 325 MG PO TABS
650.0000 mg | ORAL_TABLET | Freq: Once | ORAL | Status: AC
  Administered 2023-05-04: 650 mg via ORAL
  Filled 2023-05-04: qty 2

## 2023-05-04 NOTE — Patient Instructions (Signed)
 [facility] - A DEPT OF [facility]  Discharge Instructions: Thank you for choosing [facility] to provide your oncology and hematology care.  If you have a lab appointment with the Cancer Center, please go directly to the Cancer Center and check in at the registration area.  Wear comfortable clothing and clothing appropriate for easy access to any Portacath or PICC line.   We strive to give you quality time with your provider. You may need to reschedule your appointment if you arrive late (15 or more minutes).  Arriving late affects you and other patients whose appointments are after yours.  Also, if you miss three or more appointments without notifying the office, you may be dismissed from the clinic at the provider's discretion.      For prescription refill requests, have your pharmacy contact our office and allow 72 hours for refills to be completed.    Today you received the following chemotherapy and/or immunotherapy agents RUXIENCE       To help prevent nausea and vomiting after your treatment, we encourage you to take your nausea medication as directed.  BELOW ARE SYMPTOMS THAT SHOULD BE REPORTED IMMEDIATELY: *FEVER GREATER THAN 100.4 F (38 C) OR HIGHER *CHILLS OR SWEATING *NAUSEA AND VOMITING THAT IS NOT CONTROLLED WITH YOUR NAUSEA MEDICATION *UNUSUAL SHORTNESS OF BREATH *UNUSUAL BRUISING OR BLEEDING *URINARY PROBLEMS (pain or burning when urinating, or frequent urination) *BOWEL PROBLEMS (unusual diarrhea, constipation, pain near the anus) TENDERNESS IN MOUTH AND THROAT WITH OR WITHOUT PRESENCE OF ULCERS (sore throat, sores in mouth, or a toothache) UNUSUAL RASH, SWELLING OR PAIN  UNUSUAL VAGINAL DISCHARGE OR ITCHING   Items with * indicate a potential emergency and should be followed up as soon as possible or go to the Emergency Department if any problems should occur.  Please show the CHEMOTHERAPY ALERT CARD or IMMUNOTHERAPY  ALERT CARD at check-in to the Emergency Department and triage nurse.  Should you have questions after your visit or need to cancel or reschedule your appointment, please contact [facility] - A DEPT OF [facility]  [phone] and follow the prompts.  Office hours are 8:00 a.m. to 4:30 p.m. Monday - Friday. Please note that voicemails left after 4:00 p.m. may not be returned until the following business day.  We are closed weekends and major holidays. You have access to a nurse at all times for urgent questions. Please call the main number to the clinic [phone] and follow the prompts.  For any non-urgent questions, you may also contact your provider using MyChart. We now offer e-Visits for anyone 11 and older to request care online for non-urgent symptoms. For details visit [email or web address].   Also download the MyChart app! Go to the app store, search "MyChart", open the app, select [facility], and log in with your MyChart username and password.  Rituximab  Injection What is this medication? RITUXIMAB  (ri TUX i mab) treats leukemia and lymphoma. It works by blocking a protein that causes cancer cells to grow and multiply. This helps to slow or stop the spread of cancer cells. It may also be used to treat autoimmune conditions, such as arthritis. It works by slowing down an overactive immune system. It is a monoclonal antibody. This medicine may be used for other purposes; ask your health care provider or pharmacist if you have questions. COMMON BRAND NAME(S): RIABNI , Rituxan , RUXIENCE , truxima  What should I tell my care  team before I take this medication? They need to know if you have any of these conditions: Chest pain Heart disease Immune system problems Infection, such as chickenpox, cold sores, hepatitis B, herpes Irregular heartbeat or rhythm Kidney disease Low blood counts, such as low white cells, platelets, red cells Lung disease Recent or  upcoming vaccine An unusual or allergic reaction to rituximab , other medications, foods, dyes, or preservatives Pregnant or trying to get pregnant Breast-feeding How should I use this medication? This medication is injected into a vein. It is given by a care team in a hospital or clinic setting. A special MedGuide will be given to you before each treatment. Be sure to read this information carefully each time. Talk to your care team about the use of this medication in children. While this medication may be prescribed for children as young as 6 months for selected conditions, precautions do apply. Overdosage: If you think you have taken too much of this medicine contact a poison control center or emergency room at once. NOTE: This medicine is only for you. Do not share this medicine with others. What if I miss a dose? Keep appointments for follow-up doses. It is important not to miss your dose. Call your care team if you are unable to keep an appointment. What may interact with this medication? Do not take this medication with any of the following: Live vaccines This medication may also interact with the following: Cisplatin This list may not describe all possible interactions. Give your health care provider a list of all the medicines, herbs, non-prescription drugs, or dietary supplements you use. Also tell them if you smoke, drink alcohol, or use illegal drugs. Some items may interact with your medicine. What should I watch for while using this medication? Your condition will be monitored carefully while you are receiving this medication. You may need blood work while taking this medication. This medication can cause serious infusion reactions. To reduce the risk your care team may give you other medications to take before receiving this one. Be sure to follow the directions from your care team. This medication may increase your risk of getting an infection. Call your care team for advice if  you get a fever, chills, sore throat, or other symptoms of a cold or flu. Do not treat yourself. Try to avoid being around people who are sick. Call your care team if you are around anyone with measles, chickenpox, or if you develop sores or blisters that do not heal properly. Avoid taking medications that contain aspirin, acetaminophen , ibuprofen, naproxen, or ketoprofen unless instructed by your care team. These medications may hide a fever. This medication may cause serious skin reactions. They can happen weeks to months after starting the medication. Contact your care team right away if you notice fevers or flu-like symptoms with a rash. The rash may be red or purple and then turn into blisters or peeling of the skin. You may also notice a red rash with swelling of the face, lips, or lymph nodes in your neck or under your arms. In some patients, this medication may cause a serious brain infection that may cause death. If you have any problems seeing, thinking, speaking, walking, or standing, tell your care team right away. If you cannot reach your care team, urgently seek another source of medical care. Talk to your care team if you may be pregnant. Serious birth defects can occur if you take this medication during pregnancy and for 12 months after the  last dose. You will need a negative pregnancy test before starting this medication. Contraception is recommended while taking this medication and for 12 months after the last dose. Your care team can help you find the option that works for you. Do not breastfeed while taking this medication and for at least 6 months after the last dose. What side effects may I notice from receiving this medication? Side effects that you should report to your care team as soon as possible: Allergic reactions or angioedema--skin rash, itching or hives, swelling of the face, eyes, lips, tongue, arms, or legs, trouble swallowing or breathing Bowel blockage--stomach cramping,  unable to have a bowel movement or pass gas, loss of appetite, vomiting Dizziness, loss of balance or coordination, confusion or trouble speaking Heart attack--pain or tightness in the chest, shoulders, arms, or jaw, nausea, shortness of breath, cold or clammy skin, feeling faint or lightheaded Heart rhythm changes--fast or irregular heartbeat, dizziness, feeling faint or lightheaded, chest pain, trouble breathing Infection--fever, chills, cough, sore throat, wounds that don't heal, pain or trouble when passing urine, general feeling of discomfort or being unwell Infusion reactions--chest pain, shortness of breath or trouble breathing, feeling faint or lightheaded Kidney injury--decrease in the amount of urine, swelling of the ankles, hands, or feet Liver injury--right upper belly pain, loss of appetite, nausea, light-colored stool, dark yellow or brown urine, yellowing skin or eyes, unusual weakness or fatigue Redness, blistering, peeling, or loosening of the skin, including inside the mouth Stomach pain that is severe, does not go away, or gets worse Tumor lysis syndrome (TLS)--nausea, vomiting, diarrhea, decrease in the amount of urine, dark urine, unusual weakness or fatigue, confusion, muscle pain or cramps, fast or irregular heartbeat, joint pain Side effects that usually do not require medical attention (report to your care team if they continue or are bothersome): Headache Joint pain Nausea Runny or stuffy nose Unusual weakness or fatigue This list may not describe all possible side effects. Call your doctor for medical advice about side effects. You may report side effects to FDA at 1-800-FDA-1088. Where should I keep my medication? This medication is given in a hospital or clinic. It will not be stored at home. NOTE: This sheet is a summary. It may not cover all possible information. If you have questions about this medicine, talk to your doctor, pharmacist, or health care provider.   2024 Elsevier/Gold Standard (2021-05-12 00:00:00)

## 2023-05-04 NOTE — Progress Notes (Signed)
 [facility] CSW Progress Note  Visual merchandiser met with patient in Infusion to offer active listening and supportive counseling.  Patient answered questions appropriately and appeared to respond positively to social interaction.  She expressed no needs at this time.    Kennth Peal, LCSW Clinical Social Worker [facility]    Patient is participating in a Managed Medicaid Plan:  Yes

## 2023-05-22 ENCOUNTER — Encounter (INDEPENDENT_AMBULATORY_CARE_PROVIDER_SITE_OTHER): Payer: Self-pay

## 2023-06-19 ENCOUNTER — Ambulatory Visit: Payer: Medicare Other | Admitting: Dermatology

## 2023-07-23 ENCOUNTER — Ambulatory Visit: Admitting: Oncology

## 2023-07-23 ENCOUNTER — Other Ambulatory Visit

## 2023-07-25 ENCOUNTER — Other Ambulatory Visit

## 2023-07-25 ENCOUNTER — Ambulatory Visit: Admitting: Oncology

## 2023-08-02 ENCOUNTER — Ambulatory Visit: Admitting: Oncology

## 2023-08-02 ENCOUNTER — Other Ambulatory Visit

## 2023-08-02 ENCOUNTER — Telehealth: Payer: Self-pay | Admitting: Oncology

## 2023-08-02 NOTE — Telephone Encounter (Signed)
 Pt emergency contact / ride called to reschedule appt due to summer travels. Confirmed new appt date/time w/patient and contact. - LH

## 2023-08-06 ENCOUNTER — Inpatient Hospital Stay: Attending: Oncology

## 2023-08-06 ENCOUNTER — Inpatient Hospital Stay (HOSPITAL_BASED_OUTPATIENT_CLINIC_OR_DEPARTMENT_OTHER): Admitting: Oncology

## 2023-08-06 ENCOUNTER — Encounter: Payer: Self-pay | Admitting: Oncology

## 2023-08-06 VITALS — BP 139/70 | HR 55 | Temp 97.1°F | Resp 16 | Wt 194.0 lb

## 2023-08-06 DIAGNOSIS — Z7901 Long term (current) use of anticoagulants: Secondary | ICD-10-CM | POA: Insufficient documentation

## 2023-08-06 DIAGNOSIS — Z86718 Personal history of other venous thrombosis and embolism: Secondary | ICD-10-CM | POA: Insufficient documentation

## 2023-08-06 DIAGNOSIS — C8510 Unspecified B-cell lymphoma, unspecified site: Secondary | ICD-10-CM

## 2023-08-06 DIAGNOSIS — D563 Thalassemia minor: Secondary | ICD-10-CM | POA: Diagnosis not present

## 2023-08-06 DIAGNOSIS — G40909 Epilepsy, unspecified, not intractable, without status epilepticus: Secondary | ICD-10-CM | POA: Diagnosis not present

## 2023-08-06 DIAGNOSIS — D329 Benign neoplasm of meninges, unspecified: Secondary | ICD-10-CM | POA: Diagnosis not present

## 2023-08-06 DIAGNOSIS — D509 Iron deficiency anemia, unspecified: Secondary | ICD-10-CM | POA: Insufficient documentation

## 2023-08-06 LAB — CMP (CANCER CENTER ONLY)
ALT: 11 U/L (ref 0–44)
AST: 19 U/L (ref 15–41)
Albumin: 4.1 g/dL (ref 3.5–5.0)
Alkaline Phosphatase: 90 U/L (ref 38–126)
Anion gap: 10 (ref 5–15)
BUN: 12 mg/dL (ref 8–23)
CO2: 26 mmol/L (ref 22–32)
Calcium: 9.6 mg/dL (ref 8.9–10.3)
Chloride: 100 mmol/L (ref 98–111)
Creatinine: 0.88 mg/dL (ref 0.44–1.00)
GFR, Estimated: >60 mL/min (ref >60)
Glucose, Bld: 87 mg/dL (ref 70–99)
Potassium: 3.7 mmol/L (ref 3.5–5.1)
Sodium: 136 mmol/L (ref 135–145)
Total Bilirubin: 0.5 mg/dL (ref 0.0–1.2)
Total Protein: 8.7 g/dL — ABNORMAL HIGH (ref 6.5–8.1)

## 2023-08-06 LAB — CBC WITH DIFFERENTIAL (CANCER CENTER ONLY)
Abs Immature Granulocytes: 0.03 K/uL (ref 0.00–0.07)
Basophils Absolute: 0.1 K/uL (ref 0.0–0.1)
Basophils Relative: 1 %
Eosinophils Absolute: 0.1 K/uL (ref 0.0–0.5)
Eosinophils Relative: 1 %
HCT: 37.3 % (ref 36.0–46.0)
Hemoglobin: 11.3 g/dL — ABNORMAL LOW (ref 12.0–15.0)
Immature Granulocytes: 0 %
Lymphocytes Relative: 25 %
Lymphs Abs: 2.7 K/uL (ref 0.7–4.0)
MCH: 21.4 pg — ABNORMAL LOW (ref 26.0–34.0)
MCHC: 30.3 g/dL (ref 30.0–36.0)
MCV: 70.8 fL — ABNORMAL LOW (ref 80.0–100.0)
Monocytes Absolute: 0.6 K/uL (ref 0.1–1.0)
Monocytes Relative: 6 %
Neutro Abs: 7.2 K/uL (ref 1.7–7.7)
Neutrophils Relative %: 67 %
Platelet Count: 221 K/uL (ref 150–400)
RBC: 5.27 MIL/uL — ABNORMAL HIGH (ref 3.87–5.11)
RDW: 15.8 % — ABNORMAL HIGH (ref 11.5–15.5)
WBC Count: 10.7 K/uL — ABNORMAL HIGH (ref 4.0–10.5)
nRBC: 0 % (ref 0.0–0.2)

## 2023-08-06 NOTE — Assessment & Plan Note (Signed)
Patient is on coumadin, dosed by pcp with a goal of INR 2-3

## 2023-08-06 NOTE — Progress Notes (Signed)
 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 Bone marrow biopsy reveals marked involvement of lymphoma.   S/p 4 rituximab  treatments [2022].   Labs are reviewed and discussed with patient. IgM is >4000, viscosity further increased. Anemia is slightly worse.  Status post second round of 4 doses of weekly treatment of Rituximab  Today patient clinically is doing well. Continue observation.  History of DVT (deep vein thrombosis) Patient is on coumadin , dosed by pcp with a goal of INR 2-3  Microcytic anemia Due to alpha thalassemia trait, as well as lymphoma bone marrow involvement.  Today's hemoglobin has improved.   Orders Placed This Encounter  Procedures   CMP (Cancer Center only)    Standing Status:   Future    Expected Date:   12/06/2023    Expiration Date:   08/05/2024   CBC with Differential (Cancer Center Only)    Standing Status:   Future    Expected Date:   12/06/2023    Expiration Date:   08/05/2024   Multiple Myeloma Panel (SPEP&IFE w/QIG)    Standing Status:   Future    Expected Date:   12/06/2023    Expiration Date:   08/05/2024   Kappa/lambda light chains    Standing Status:   Future    Expected Date:   12/06/2023    Expiration Date:   08/05/2024   Viscosity, serum    Standing Status:   Future    Expected Date:   12/06/2023    Expiration Date:   08/05/2024   Follow up in 4 months.  All questions were answered. The patient knows to call the clinic with any problems, questions or concerns.  Zelphia Cap, MD, PhD [facility] Hematology Oncology 08/06/2023    HISTORY OF PRESENTING ILLNESS:  Laurie Rice is a  63 y.o.  female with PMH listed below follows up for lymphoma 06/10/2017 CBC  showed elevated white count of 38.5, hemoglobin 10.2, MCV 74.7, absolute lymphocyte 30.4, basophil 0.13, Previous lab records reviewed. Leukocytosis onset of chronic, duration is since at least 2014 Anemia is also chronic since at least 2016 No aggravating or elevated factors. Patient is a poor historian.  She has a seizure disorder and meningioma.  She is not medical competent and has legal guardian.  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    Associated symptoms or signs:  Denies weight loss, fever, chills,night sweats.  Recent history of weight loss about 10 pounds over the past 1 year.  Smoking history: Never smoking History of recent oral steroid use or steroid injection: Denies History of recent infection: Denies Autoimmune disease history.  Denies  Reviewed patient's past medical history, was previously seen by Dr. Rudell with last visit more than 3 years ago.  Today she wants to reestablish care at [facility] site. 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  Phenotype did not suggest a specific histologic type and was not typical for CLL/SLL, mantle cell lymphoma, hairy cell leukemia or follicular cell lymphoma.  BCR ABL testing was negative.  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  Chronic microcytic anemia since 2008.  She had hemoglobin electrophoresis reviewed normal hemoglobin  F and A2 Patient has a history of bilateral DVT and pulmonary embolism in 2010.  Has been on Coumadin  chronically She developed right lower extremity DVT on 07/09/2014 secondary to subtherapeutic INR  01/30/2020- 02/20/2020 weekly rituximab  treatment x 4  INTERVAL HISTORY Laurie Rice is a 63 y.o. female who has above history reviewed by me today presents for follow up visit for lymphoma. She was accompanied by care giver from group home.  No reports of new  complaints. Appetite is good. She has gained weight    .Review of Systems  Unable to perform ROS: Other (Intellectual disability)  Constitutional:  Positive for unexpected weight change. Negative for fatigue.    MEDICAL HISTORY:  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    History of lymphocytosis    Impetigo    History of   Liver masses    History of, Benign   Meningioma (HCC)    Mild mental retardation    Monoclonal gammopathy    low level   Personal history of pulmonary embolism    Seizures (HCC)    Thyroid  mass    history of    SURGICAL HISTORY: Past Surgical History:  Procedure Laterality Date   COLONOSCOPY WITH PROPOFOL  N/A 04/03/2022   Procedure: COLONOSCOPY WITH PROPOFOL ;  Surgeon: Maryruth Ole DASEN, MD;  Location: [facility] ENDOSCOPY;  Service: Endoscopy;  Laterality: N/A;   NO PAST SURGERIES      SOCIAL HISTORY: Social History   Socioeconomic History   Marital status: Single    Spouse name: Not on file   Number of children: Not on file   Years of education: Not on file   Highest education level: Not on file  Occupational History   Not on file  Tobacco Use   Smoking status: Never   Smokeless tobacco: Never  Substance and Sexual Activity   Alcohol use: No   Drug use: No   Sexual activity: Not on file  Other Topics Concern   Not on file  Social History Narrative   Not on file   Social Drivers of Health   Financial Resource Strain: Low Risk  (08/04/2022)   Received from [facility]   Overall Financial Resource Strain (CARDIA)    Difficulty of Paying Living Expenses: Not hard at all  Food Insecurity: No Food Insecurity (08/04/2022)   Received from [facility]   Hunger Vital Sign    Within the past 12 months, you worried that your food would run out before you got the money to buy more.: Never  true    Within the past 12 months, the food you bought just didn't last and you didn't have money to get more.: Never true  Transportation Needs: No Transportation Needs (08/04/2022)   Received from [facility] - Transportation    In the past 12 months, has lack of transportation kept you from medical appointments or from getting medications?: No    Lack of Transportation (Non-Medical): No  Physical Activity: Not on file  Stress: Not on file  Social Connections: Not on file  Intimate Partner Violence: Not on file    FAMILY HISTORY: Family History  Problem Relation Age of Onset   Hypertension Other    Arthritis Other    Breast cancer Neg Hx  ALLERGIES:  is allergic to dalbavancin.  MEDICATIONS:  Current Outpatient Medications  Medication Sig Dispense Refill   amLODipine (NORVASC) 10 MG tablet Take 10 mg by mouth daily.     bisoprolol -hydrochlorothiazide  (ZIAC ) 2.5-6.25 MG tablet Take 2 tablets by mouth daily.     dupilumab  (DUPIXENT ) 300 MG/2ML prefilled syringe Inject 300 mg into the skin every 14 (fourteen) days. Starting at day 15 for maintenance. 4 mL 6   Emollient (CETAPHIL) cream      ferrous sulfate  325 (65 FE) MG EC tablet Take 1 tablet (325 mg total) by mouth daily with breakfast. With orange juice 30 tablet 3   folic acid  (FOLVITE ) 1 MG tablet TAKE ONE TABLET BY MOUTH EACH DAY.     ketoconazole  (NIZORAL ) 2 % cream Apply twice daily to abdominal area for 2 weeks. Apply 1st 60 g 2   lamoTRIgine (LAMICTAL) 100 MG tablet Take 100 mg by mouth 2 (two) times daily.     mupirocin  ointment (BACTROBAN ) 2 % Apply daily to any open sores or wounds as needed. 22 g 3   PHENObarbital  (LUMINAL) 60 MG tablet Take 60 mg by mouth at bedtime.     tacrolimus  (PROTOPIC ) 0.1 % ointment Apply topically as needed.     triamcinolone  ointment (KENALOG ) 0.1 % APPLY TO AFFECTED AREAS ECZEMA ONCE DAILY UP TO 5 DAYS A WEEK AS NEEDED FOR FLARES **EXTERNAL USE ONLY** AVOID  FACE, GROIN, AND AXILLA 80 g 9   warfarin (COUMADIN ) 1 MG tablet Take 1 tablet (1 mg total) by mouth every Monday, Wednesday, and Friday. Take with (2) 4 mg tablets every Monday, Wednesday, and Friday for a total of 9 mg on those days. 15 tablet 0   warfarin (COUMADIN ) 10 MG tablet Take 10 mg by mouth daily.     phenytoin  (DILANTIN ) 100 MG ER capsule TAKE 2 CAPSULES BY MOUTH AT BEDTIME (SEIZURE DISORDER) (Patient not taking: Reported on 04/13/2023)     phenytoin  (DILANTIN ) 50 MG tablet Chew 50 mg by mouth at bedtime. (Patient not taking: Reported on 04/13/2023)     warfarin (COUMADIN ) 4 MG tablet Take 2 tablets (8 mg total) by mouth daily at 4 PM. Take 2 tablets daily for 8 mg daily. Add (1) 1 mg tablet on MWF for a total of 9 mg on those days. (Patient not taking: Reported on 08/06/2023) 60 tablet 0   No current facility-administered medications for this visit.     PHYSICAL EXAMINATION: ECOG PERFORMANCE STATUS: 1 - Symptomatic but completely ambulatory Vitals:   08/06/23 1334  BP: 139/70  Pulse: (!) 55  Resp: 16  Temp: (!) 97.1 F (36.2 C)  SpO2: 100%   Filed Weights   08/06/23 1334  Weight: 194 lb (88 kg)    Physical Exam Constitutional:      General: She is not in acute distress. HENT:     Head: Normocephalic and atraumatic.  Eyes:     General: No scleral icterus. Cardiovascular:     Rate and Rhythm: Normal rate.  Pulmonary:     Effort: Pulmonary effort is normal. No respiratory distress.     Breath sounds: No wheezing.  Abdominal:     General: Bowel sounds are normal. There is no distension.     Palpations: Abdomen is soft.  Musculoskeletal:        General: Swelling present. No deformity. Normal range of motion.     Cervical back: Normal range of motion and neck supple.     Comments: Bilateral lower  extremity swelling, right worse than left.  Skin:    General: Skin is warm and dry.     Findings: No erythema or rash.     Comments: hyperpigmentation changes of lower  extremities and multiple areas of low back  Neurological:     Mental Status: She is alert. Mental status is at baseline.  Psychiatric:        Mood and Affect: Mood normal.        Latest Ref Rng & Units 08/06/2023   12:50 PM  CMP  Glucose 70 - 99 mg/dL 87   BUN 8 - 23 mg/dL 12   Creatinine 9.55 - 1.00 mg/dL 9.11   Sodium 864 - 854 mmol/L 136   Potassium 3.5 - 5.1 mmol/L 3.7   Chloride 98 - 111 mmol/L 100   CO2 22 - 32 mmol/L 26   Calcium  8.9 - 10.3 mg/dL 9.6   Total Protein 6.5 - 8.1 g/dL 8.7   Total Bilirubin 0.0 - 1.2 mg/dL 0.5   Alkaline Phos 38 - 126 U/L 90   AST 15 - 41 U/L 19   ALT 0 - 44 U/L 11       Latest Ref Rng & Units 08/06/2023    1:13 PM  CBC  WBC 4.0 - 10.5 K/uL 10.7   Hemoglobin 12.0 - 15.0 g/dL 88.6   Hematocrit 63.9 - 46.0 % 37.3   Platelets 150 - 400 K/uL 221      RADIOGRAPHIC STUDIES: I have personally reviewed the radiological images as listed and agreed with the findings in the report. No results found.  LABORATORY DATA:  I have reviewed the data as listed    Latest Ref Rng & Units 08/06/2023    1:13 PM 04/13/2023    8:16 AM 03/19/2023   10:21 AM  CBC  WBC 4.0 - 10.5 K/uL 10.7  8.6  8.7   Hemoglobin 12.0 - 15.0 g/dL 88.6  89.4  89.2   Hematocrit 36.0 - 46.0 % 37.3  34.9  36.5   Platelets 150 - 400 K/uL 221  284  207     Recent Labs    03/19/23 1021 04/13/23 0816 08/06/23 1250  NA 134* 138 136  K 3.9 3.6 3.7  CL 99 104 100  CO2 24 24 26   GLUCOSE 77 103* 87  BUN 15 10 12   CREATININE 1.00 0.84 0.88  CALCIUM  9.0 8.8* 9.6  GFRNONAA >60 >60 >60  PROT 8.7* 7.8 8.7*  ALBUMIN 3.5 3.4* 4.1  AST 22 22 19   ALT 14 14 11   ALKPHOS 64 67 90  BILITOT 0.3 0.3 0.5   Iron/TIBC/Ferritin/ %Sat    Component Value Date/Time   IRON 85 08/03/2022 1015   IRON 94 08/22/2012 1142   TIBC 232 (L) 08/03/2022 1015   TIBC 221 (L) 08/22/2012 1142   FERRITIN 124 08/03/2022 1015   FERRITIN 65 08/22/2012 1142   IRONPCTSAT 37 (H) 08/03/2022 1015    IRONPCTSAT 43 08/22/2012 1142

## 2023-08-06 NOTE — Assessment & Plan Note (Signed)
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 Bone marrow biopsy reveals marked involvement of lymphoma.   S/p 4 rituximab  treatments [2022].   Labs are reviewed and discussed with patient. IgM is >4000, viscosity further increased. Anemia is slightly worse.  Status post second round of 4 doses of weekly treatment of Rituximab  Today patient clinically is doing well. Continue observation.

## 2023-08-06 NOTE — Assessment & Plan Note (Addendum)
 Due to alpha thalassemia trait, as well as lymphoma bone marrow involvement.  Today's hemoglobin has improved.

## 2023-08-06 NOTE — Progress Notes (Signed)
 Pt in for follow up, denies any concerns.  Caregiver with patient today from facility.  States current coumadin  dosage is 13 mg daily.

## 2023-08-07 LAB — KAPPA/LAMBDA LIGHT CHAINS
Kappa free light chain: 5 mg/L (ref 3.3–19.4)
Kappa, lambda light chain ratio: 0.12 — ABNORMAL LOW (ref 0.26–1.65)
Lambda free light chains: 42.7 mg/L — ABNORMAL HIGH (ref 5.7–26.3)

## 2023-08-07 LAB — VISCOSITY, SERUM: Viscosity, Serum: 2.4 rel.saline — ABNORMAL HIGH (ref 1.4–2.1)

## 2023-08-08 LAB — MULTIPLE MYELOMA PANEL, SERUM
Albumin SerPl Elph-Mcnc: 3.9 g/dL (ref 2.9–4.4)
Albumin/Glob SerPl: 1 (ref 0.7–1.7)
Alpha 1: 0.3 g/dL (ref 0.0–0.4)
Alpha2 Glob SerPl Elph-Mcnc: 0.9 g/dL (ref 0.4–1.0)
B-Globulin SerPl Elph-Mcnc: 0.8 g/dL (ref 0.7–1.3)
Gamma Glob SerPl Elph-Mcnc: 2.4 g/dL — ABNORMAL HIGH (ref 0.4–1.8)
Globulin, Total: 4.3 g/dL — ABNORMAL HIGH (ref 2.2–3.9)
IgA: 39 mg/dL — ABNORMAL LOW (ref 87–352)
IgG (Immunoglobin G), Serum: 587 mg/dL (ref 586–1602)
IgM (Immunoglobulin M), Srm: 3082 mg/dL — ABNORMAL HIGH (ref 26–217)
M Protein SerPl Elph-Mcnc: 1.9 g/dL — ABNORMAL HIGH
Total Protein ELP: 8.2 g/dL (ref 6.0–8.5)

## 2023-08-15 ENCOUNTER — Other Ambulatory Visit

## 2023-08-15 ENCOUNTER — Ambulatory Visit: Admitting: Oncology

## 2023-09-18 ENCOUNTER — Other Ambulatory Visit: Payer: Self-pay

## 2023-09-18 DIAGNOSIS — L2089 Other atopic dermatitis: Secondary | ICD-10-CM

## 2023-09-18 MED ORDER — DUPIXENT 300 MG/2ML ~~LOC~~ SOSY: 300.0000 mg | PREFILLED_SYRINGE | SUBCUTANEOUS | 6 refills | Status: AC

## 2023-09-18 NOTE — Progress Notes (Signed)
 Senderra requesting refills. aw

## 2023-10-02 ENCOUNTER — Other Ambulatory Visit: Payer: Self-pay | Admitting: Family Medicine

## 2023-10-02 DIAGNOSIS — R921 Mammographic calcification found on diagnostic imaging of breast: Secondary | ICD-10-CM

## 2023-10-02 DIAGNOSIS — Z1231 Encounter for screening mammogram for malignant neoplasm of breast: Secondary | ICD-10-CM

## 2023-10-04 ENCOUNTER — Ambulatory Visit
Admission: RE | Admit: 2023-10-04 | Discharge: 2023-10-04 | Disposition: A | Discharge status: Home / Self Care | Source: Ambulatory Visit | Attending: Family Medicine | Admitting: Family Medicine

## 2023-10-04 DIAGNOSIS — R921 Mammographic calcification found on diagnostic imaging of breast: Secondary | ICD-10-CM | POA: Insufficient documentation

## 2023-10-04 DIAGNOSIS — Z1231 Encounter for screening mammogram for malignant neoplasm of breast: Secondary | ICD-10-CM | POA: Insufficient documentation

## 2023-12-06 ENCOUNTER — Inpatient Hospital Stay

## 2023-12-06 ENCOUNTER — Telehealth: Payer: Self-pay | Admitting: Oncology

## 2023-12-06 ENCOUNTER — Inpatient Hospital Stay: Admitting: Oncology

## 2023-12-06 NOTE — Telephone Encounter (Signed)
 Called home number listed on pt chart and spoke with facility to r/s pts appts that she did not show for today. Appts scheduled for next available and facility stated they wrote it down.

## 2023-12-06 NOTE — Assessment & Plan Note (Deleted)
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 Bone marrow biopsy reveals marked involvement of lymphoma.   S/p 4 rituximab  treatments [2022].   Labs are reviewed and discussed with patient. IgM is >4000, viscosity further increased. Anemia is slightly worse.  Status post second round of 4 doses of weekly treatment of Rituximab  Today patient clinically is doing well. Continue observation.

## 2024-01-05 ENCOUNTER — Encounter: Payer: Self-pay | Admitting: Oncology

## 2024-01-14 ENCOUNTER — Encounter: Payer: Self-pay | Admitting: Oncology

## 2024-01-21 ENCOUNTER — Inpatient Hospital Stay: Attending: Oncology

## 2024-01-21 ENCOUNTER — Inpatient Hospital Stay: Admitting: Oncology

## 2024-01-21 ENCOUNTER — Encounter: Payer: Self-pay | Admitting: Oncology

## 2024-01-21 VITALS — BP 128/67 | HR 54 | Temp 96.8°F | Resp 18 | Wt 197.5 lb

## 2024-01-21 DIAGNOSIS — C8510 Unspecified B-cell lymphoma, unspecified site: Secondary | ICD-10-CM

## 2024-01-21 DIAGNOSIS — Z86718 Personal history of other venous thrombosis and embolism: Secondary | ICD-10-CM | POA: Diagnosis not present

## 2024-01-21 DIAGNOSIS — D509 Iron deficiency anemia, unspecified: Secondary | ICD-10-CM

## 2024-01-21 LAB — CMP (CANCER CENTER ONLY)
ALT: 12 U/L (ref 0–44)
AST: 19 U/L (ref 15–41)
Albumin: 4.3 g/dL (ref 3.5–5.0)
Alkaline Phosphatase: 98 U/L (ref 38–126)
Anion gap: 12 (ref 5–15)
BUN: 12 mg/dL (ref 8–23)
CO2: 27 mmol/L (ref 22–32)
Calcium: 9.9 mg/dL (ref 8.9–10.3)
Chloride: 99 mmol/L (ref 98–111)
Creatinine: 0.78 mg/dL (ref 0.44–1.00)
GFR, Estimated: >60 mL/min
Glucose, Bld: 93 mg/dL (ref 70–99)
Potassium: 4 mmol/L (ref 3.5–5.1)
Sodium: 138 mmol/L (ref 135–145)
Total Bilirubin: 0.3 mg/dL (ref 0.0–1.2)
Total Protein: 8.4 g/dL — ABNORMAL HIGH (ref 6.5–8.1)

## 2024-01-21 LAB — CBC WITH DIFFERENTIAL (CANCER CENTER ONLY)
Abs Immature Granulocytes: 0.02 K/uL (ref 0.00–0.07)
Basophils Absolute: 0.1 K/uL (ref 0.0–0.1)
Basophils Relative: 1 %
Eosinophils Absolute: 0.1 K/uL (ref 0.0–0.5)
Eosinophils Relative: 1 %
HCT: 37.5 % (ref 36.0–46.0)
Hemoglobin: 11.4 g/dL — ABNORMAL LOW (ref 12.0–15.0)
Immature Granulocytes: 0 %
Lymphocytes Relative: 23 %
Lymphs Abs: 2.3 K/uL (ref 0.7–4.0)
MCH: 21.7 pg — ABNORMAL LOW (ref 26.0–34.0)
MCHC: 30.4 g/dL (ref 30.0–36.0)
MCV: 71.4 fL — ABNORMAL LOW (ref 80.0–100.0)
Monocytes Absolute: 0.7 K/uL (ref 0.1–1.0)
Monocytes Relative: 7 %
Neutro Abs: 7 K/uL (ref 1.7–7.7)
Neutrophils Relative %: 68 %
Platelet Count: 248 K/uL (ref 150–400)
RBC: 5.25 MIL/uL — ABNORMAL HIGH (ref 3.87–5.11)
RDW: 15.3 % (ref 11.5–15.5)
WBC Count: 10.1 K/uL (ref 4.0–10.5)
nRBC: 0 % (ref 0.0–0.2)

## 2024-01-21 NOTE — Assessment & Plan Note (Signed)
 Due to alpha thalassemia trait, as well as lymphoma bone marrow involvement.  Today's hemoglobin has improved.

## 2024-01-21 NOTE — Assessment & Plan Note (Addendum)
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 Bone marrow biopsy reveals marked involvement of lymphoma.   S/p 4 rituximab  treatments [2022].   Labs are reviewed and discussed with patient. IgM is >4000, viscosity further increased. Anemia is slightly worse.  S/p re- treatment with Rituximab  x 4 [2025] Today patient clinically is doing well. Patient has mildly decreased IgM level, M protein, viscosity.  Today's labs are pending. Continue observation.

## 2024-01-21 NOTE — Assessment & Plan Note (Signed)
Patient is on coumadin, dosed by pcp with a goal of INR 2-3

## 2024-01-21 NOTE — Progress Notes (Signed)
 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 Bone marrow biopsy reveals marked involvement of lymphoma.   S/p 4 rituximab  treatments [2022].   Labs are reviewed and discussed with patient. IgM is >4000, viscosity further increased. Anemia is slightly worse.  S/p re- treatment with Rituximab  x 4 [2025] Today patient clinically is doing well. Patient has mildly decreased IgM level, M protein, viscosity.  Today's labs are pending. Continue observation.  History of DVT (deep vein thrombosis) Patient is on coumadin , dosed by pcp with a goal of INR 2-3  Microcytic anemia Due to alpha thalassemia trait, as well as lymphoma bone marrow involvement.  Today's hemoglobin has improved.   Orders Placed This Encounter  Procedures   CMP (Cancer Center only)    Standing Status:   Future    Expected Date:   07/20/2024    Expiration Date:   10/18/2024   CBC with Differential (Cancer Center Only)    Standing Status:   Future    Expected Date:   07/20/2024    Expiration Date:   10/18/2024   Multiple Myeloma Panel (SPEP&IFE w/QIG)    Standing Status:   Future    Expected Date:   07/20/2024    Expiration Date:   10/18/2024   Kappa/lambda light chains    Standing Status:   Future    Expected Date:   07/20/2024    Expiration Date:   10/18/2024   Viscosity, serum    Standing Status:   Future    Expected Date:   07/20/2024    Expiration Date:   10/18/2024   Follow up in 6 months.  All questions were answered. The patient knows to call the clinic with any problems, questions or concerns.  Zelphia Cap, MD, PhD [facility] Hematology Oncology 01/21/2024    HISTORY OF PRESENTING ILLNESS:  Laurie Rice is a   64 y.o.  female with PMH listed below follows up for lymphoma 06/10/2017 CBC showed elevated white count of 38.5, hemoglobin 10.2, MCV 74.7, absolute lymphocyte 30.4, basophil 0.13, Previous lab records reviewed. Leukocytosis onset of chronic, duration is since at least 2014 Anemia is also chronic since at least 2016 No aggravating or elevated factors. Patient is a poor historian.  She has a seizure disorder and meningioma.  She is not medical competent and has legal guardian.  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    Associated symptoms or signs:  Denies weight loss, fever, chills,night sweats.  Recent history of weight loss about 10 pounds over the past 1 year.  Smoking history: Never smoking History of recent oral steroid use or steroid injection: Denies History of recent infection: Denies Autoimmune disease history.  Denies  Reviewed patient's past medical history, was previously seen by Dr. Rudell with last visit more than 3 years ago.  Today she wants to reestablish care at [facility] site. 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  Phenotype did not suggest a specific histologic type and was not typical for CLL/SLL, mantle cell lymphoma, hairy cell leukemia or follicular cell lymphoma.  BCR ABL testing was negative.  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  Chronic microcytic  anemia since 2008.  She had hemoglobin electrophoresis reviewed normal hemoglobin F and A2 Patient has a history of bilateral DVT and pulmonary embolism in 2010.  Has been on Coumadin  chronically She developed right lower extremity DVT on 07/09/2014 secondary to subtherapeutic INR  01/30/2020- 02/20/2020 weekly rituximab  treatment x 4  INTERVAL HISTORY Laurie Rice is a 64 y.o. female who has above history reviewed by me today presents for follow up visit for  lymphoma. She was accompanied by care giver from group home.  No reports of new complaints. Appetite is good. She has gained weight    .Review of Systems  Unable to perform ROS: Other (Intellectual disability)  Constitutional:  Negative for appetite change and fatigue.  Respiratory:  Negative for shortness of breath.   Gastrointestinal:  Negative for abdominal pain.    MEDICAL HISTORY:  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    History of lymphocytosis    Impetigo    History of   Liver masses    History of, Benign   Meningioma (HCC)    Mild mental retardation    Monoclonal gammopathy    low level   Personal history of pulmonary embolism    Seizures (HCC)    Thyroid  mass    history of    SURGICAL HISTORY: Past Surgical History:  Procedure Laterality Date   COLONOSCOPY WITH PROPOFOL  N/A 04/03/2022   Procedure: COLONOSCOPY WITH PROPOFOL ;  Surgeon: Maryruth Ole DASEN, MD;  Location: [facility] ENDOSCOPY;  Service: Endoscopy;  Laterality: N/A;   NO PAST SURGERIES      SOCIAL HISTORY: Social History   Socioeconomic History   Marital status: Single    Spouse name: Not on file   Number of children: Not on file   Years of education: Not on file   Highest education level: Not on file  Occupational History   Not on file  Tobacco Use   Smoking status: Never   Smokeless tobacco: Never  Substance and Sexual Activity   Alcohol use: No   Drug use: No   Sexual activity: Not on file  Other Topics Concern   Not on file  Social History Narrative   Not on file   Social Drivers of Health   Tobacco Use: Low Risk (01/21/2024)   Patient History    Smoking Tobacco Use: Never    Smokeless Tobacco Use: Never    Passive Exposure: Not on file  Financial Resource Strain: Low Risk  (09/11/2023)   Received from [facility]   Overall Financial  Resource Strain (CARDIA)    Difficulty of Paying Living Expenses: Not hard at all  Food Insecurity: No Food Insecurity (09/11/2023)   Received from [facility]   Epic    Within the past 12 months, you worried that your food would run out before you got the money to buy more.: Never true    Within the past 12 months, the food you bought just didn't last and you didn't have money to get more.: Never true  Transportation Needs: No Transportation Needs (09/11/2023)   Received from [facility] - Transportation    In the past 12 months, has lack of transportation kept you from medical appointments or from getting medications?: No    Lack of Transportation (Non-Medical):  No  Physical Activity: Not on file  Stress: Not on file  Social Connections: Not on file  Intimate Partner Violence: Not on file  Depression (PHQ2-9): Low Risk (02/03/2021)   Depression (PHQ2-9)    PHQ-2 Score: 0  Alcohol Screen: Not on file  Housing: Unknown (09/11/2023)   Received from [facility]   Epic    In the last 12 months, was there a time when you were not able to pay the mortgage or rent on time?: No    Number of Times Moved in the Last Year: Not on file    At any time in the past 12 months, were you homeless or living in a shelter (including now)?: No  Utilities: Not At Risk (09/11/2023)   Received from [facility]   Epic    In the past 12 months has the electric, gas, oil, or water company threatened to shut off services in your home?: No  Health Literacy: Not on file    FAMILY HISTORY: Family History  Problem Relation Age of Onset   Hypertension Other    Arthritis Other    Breast cancer Neg Hx     ALLERGIES:  is allergic to dalbavancin.  MEDICATIONS:  Current Outpatient Medications  Medication Sig Dispense Refill   amLODipine (NORVASC) 10 MG tablet Take 10 mg by mouth daily.     bisoprolol -hydrochlorothiazide  (ZIAC ) 2.5-6.25  MG tablet Take 2 tablets by mouth daily.     dupilumab  (DUPIXENT ) 300 MG/2ML prefilled syringe Inject 300 mg into the skin every 14 (fourteen) days. Starting at day 15 for maintenance. 4 mL 6   ferrous sulfate  325 (65 FE) MG EC tablet Take 1 tablet (325 mg total) by mouth daily with breakfast. With orange juice 30 tablet 3   folic acid  (FOLVITE ) 1 MG tablet TAKE ONE TABLET BY MOUTH EACH DAY.     mupirocin  ointment (BACTROBAN ) 2 % Apply daily to any open sores or wounds as needed. 22 g 3   PHENObarbital  (LUMINAL) 60 MG tablet Take 60 mg by mouth at bedtime.     tacrolimus  (PROTOPIC ) 0.1 % ointment Apply topically as needed.     triamcinolone  ointment (KENALOG ) 0.1 % APPLY TO AFFECTED AREAS ECZEMA ONCE DAILY UP TO 5 DAYS A WEEK AS NEEDED FOR FLARES **EXTERNAL USE ONLY** AVOID FACE, GROIN, AND AXILLA 80 g 9   warfarin (COUMADIN ) 1 MG tablet Take 1 tablet (1 mg total) by mouth every Monday, Wednesday, and Friday. Take with (2) 4 mg tablets every Monday, Wednesday, and Friday for a total of 9 mg on those days. 15 tablet 0   warfarin (COUMADIN ) 10 MG tablet Take 10 mg by mouth daily.     Emollient (CETAPHIL) cream      ketoconazole  (NIZORAL ) 2 % cream Apply twice daily to abdominal area for 2 weeks. Apply 1st 60 g 2   lamoTRIgine (LAMICTAL) 100 MG tablet Take 100 mg by mouth 2 (two) times daily.     phenytoin  (DILANTIN ) 100 MG ER capsule TAKE 2 CAPSULES BY MOUTH AT BEDTIME (SEIZURE DISORDER) (Patient not taking: Reported on 01/21/2024)     phenytoin  (DILANTIN ) 50 MG tablet Chew 50 mg by mouth at bedtime. (Patient not taking: Reported on 01/21/2024)     warfarin (COUMADIN ) 4 MG tablet Take 2 tablets (8 mg total) by mouth daily at 4 PM. Take 2 tablets daily for 8 mg daily. Add (1) 1 mg tablet on MWF for a total of 9 mg  on those days. (Patient not taking: Reported on 01/21/2024) 60 tablet 0   No current facility-administered medications for this visit.     PHYSICAL EXAMINATION: ECOG PERFORMANCE STATUS: 1 -  Symptomatic but completely ambulatory Vitals:   01/21/24 1546  BP: 128/67  Pulse: (!) 54  Resp: 18  Temp: (!) 96.8 F (36 C)  SpO2: 100%   Filed Weights   01/21/24 1546  Weight: 197 lb 8 oz (89.6 kg)    Physical Exam Constitutional:      General: She is not in acute distress. HENT:     Head: Normocephalic and atraumatic.  Eyes:     General: No scleral icterus. Cardiovascular:     Rate and Rhythm: Normal rate.  Pulmonary:     Effort: Pulmonary effort is normal. No respiratory distress.     Breath sounds: No wheezing.  Abdominal:     General: Bowel sounds are normal. There is no distension.     Palpations: Abdomen is soft.  Musculoskeletal:        General: Swelling present. No deformity. Normal range of motion.     Cervical back: Normal range of motion and neck supple.     Comments: Bilateral lower extremity swelling, right worse than left.  Skin:    General: Skin is warm and dry.     Findings: No erythema or rash.     Comments: hyperpigmentation changes of lower extremities and multiple areas of low back  Neurological:     Mental Status: She is alert. Mental status is at baseline.  Psychiatric:        Mood and Affect: Mood normal.        Latest Ref Rng & Units 01/21/2024    3:25 PM  CMP  Glucose 70 - 99 mg/dL 93   BUN 8 - 23 mg/dL 12   Creatinine 9.55 - 1.00 mg/dL 9.21   Sodium 864 - 854 mmol/L 138   Potassium 3.5 - 5.1 mmol/L 4.0   Chloride 98 - 111 mmol/L 99   CO2 22 - 32 mmol/L 27   Calcium  8.9 - 10.3 mg/dL 9.9   Total Protein 6.5 - 8.1 g/dL 8.4   Total Bilirubin 0.0 - 1.2 mg/dL 0.3   Alkaline Phos 38 - 126 U/L 98   AST 15 - 41 U/L 19   ALT 0 - 44 U/L 12       Latest Ref Rng & Units 01/21/2024    3:25 PM  CBC  WBC 4.0 - 10.5 K/uL 10.1   Hemoglobin 12.0 - 15.0 g/dL 88.5   Hematocrit 63.9 - 46.0 % 37.5   Platelets 150 - 400 K/uL 248      RADIOGRAPHIC STUDIES: I have personally reviewed the radiological images as listed and agreed with the  findings in the report. No results found.  LABORATORY DATA:  I have reviewed the data as listed    Latest Ref Rng & Units 01/21/2024    3:25 PM 08/06/2023    1:13 PM 04/13/2023    8:16 AM  CBC  WBC 4.0 - 10.5 K/uL 10.1  10.7  8.6   Hemoglobin 12.0 - 15.0 g/dL 88.5  88.6  89.4   Hematocrit 36.0 - 46.0 % 37.5  37.3  34.9   Platelets 150 - 400 K/uL 248  221  284     Recent Labs    04/13/23 0816 08/06/23 1250 01/21/24 1525  NA 138 136 138  K 3.6 3.7 4.0  CL 104 100 99  CO2 24 26 27   GLUCOSE 103* 87 93  BUN 10 12 12   CREATININE 0.84 0.88 0.78  CALCIUM  8.8* 9.6 9.9  GFRNONAA >60 >60 >60  PROT 7.8 8.7* 8.4*  ALBUMIN 3.4* 4.1 4.3  AST 22 19 19   ALT 14 11 12   ALKPHOS 67 90 98  BILITOT 0.3 0.5 0.3   Iron/TIBC/Ferritin/ %Sat    Component Value Date/Time   IRON 85 08/03/2022 1015   IRON 94 08/22/2012 1142   TIBC 232 (L) 08/03/2022 1015   TIBC 221 (L) 08/22/2012 1142   FERRITIN 124 08/03/2022 1015   FERRITIN 65 08/22/2012 1142   IRONPCTSAT 37 (H) 08/03/2022 1015   IRONPCTSAT 43 08/22/2012 1142      "

## 2024-01-22 ENCOUNTER — Other Ambulatory Visit: Payer: Self-pay

## 2024-01-22 LAB — KAPPA/LAMBDA LIGHT CHAINS
Kappa free light chain: 5.4 mg/L (ref 3.3–19.4)
Kappa, lambda light chain ratio: 0.16 — ABNORMAL LOW (ref 0.26–1.65)
Lambda free light chains: 33.5 mg/L — ABNORMAL HIGH (ref 5.7–26.3)

## 2024-01-22 LAB — VISCOSITY, SERUM: Viscosity, Serum: 2.2 rel.saline — ABNORMAL HIGH (ref 1.4–2.1)

## 2024-01-23 LAB — MULTIPLE MYELOMA PANEL, SERUM
Albumin SerPl Elph-Mcnc: 3.9 g/dL (ref 2.9–4.4)
Albumin/Glob SerPl: 1 (ref 0.7–1.7)
Alpha 1: 0.3 g/dL (ref 0.0–0.4)
Alpha2 Glob SerPl Elph-Mcnc: 0.9 g/dL (ref 0.4–1.0)
B-Globulin SerPl Elph-Mcnc: 2.5 g/dL — ABNORMAL HIGH (ref 0.7–1.3)
Gamma Glob SerPl Elph-Mcnc: 0.4 g/dL (ref 0.4–1.8)
Globulin, Total: 4.1 g/dL — ABNORMAL HIGH (ref 2.2–3.9)
IgA: 37 mg/dL — ABNORMAL LOW (ref 87–352)
IgG (Immunoglobin G), Serum: 538 mg/dL — ABNORMAL LOW (ref 586–1602)
IgM (Immunoglobulin M), Srm: 2503 mg/dL — ABNORMAL HIGH (ref 26–217)
M Protein SerPl Elph-Mcnc: 1.7 g/dL — ABNORMAL HIGH
Total Protein ELP: 8 g/dL (ref 6.0–8.5)

## 2024-01-29 ENCOUNTER — Telehealth: Payer: Self-pay

## 2024-01-29 NOTE — Telephone Encounter (Signed)
 Patient caregiver LVM asking about getting a sample of Dupixent  for tomorrow's injection while they are waiting on PA.  Lonell RAMAN., RMA

## 2024-01-30 NOTE — Telephone Encounter (Signed)
 Spoke with care giver and sample will be picked up tomorrow. aw

## 2024-01-31 NOTE — Telephone Encounter (Signed)
 Care giver picked up box of Dupixent . LOT: [id number] EXP: 12/01/25

## 2024-02-01 ENCOUNTER — Other Ambulatory Visit: Payer: Self-pay | Admitting: Dermatology

## 2024-02-01 DIAGNOSIS — L98492 Non-pressure chronic ulcer of skin of other sites with fat layer exposed: Secondary | ICD-10-CM

## 2024-02-26 ENCOUNTER — Ambulatory Visit: Payer: Medicare Other | Admitting: Dermatology

## 2024-04-25 ENCOUNTER — Ambulatory Visit (INDEPENDENT_AMBULATORY_CARE_PROVIDER_SITE_OTHER): Admitting: Vascular Surgery

## 2024-07-21 ENCOUNTER — Inpatient Hospital Stay

## 2024-07-21 ENCOUNTER — Inpatient Hospital Stay: Admitting: Oncology
